# Patient Record
Sex: Female | Born: 1935 | Race: White | Hispanic: No | Smoking: Never smoker
Health system: Southern US, Community
[De-identification: ages and names within clinical notes are randomized; demographics above are authoritative.]

## PROBLEM LIST (undated history)

## (undated) DIAGNOSIS — S0993XA Unspecified injury of face, initial encounter: Secondary | ICD-10-CM

## (undated) DIAGNOSIS — E785 Hyperlipidemia, unspecified: Secondary | ICD-10-CM

## (undated) DIAGNOSIS — G629 Polyneuropathy, unspecified: Secondary | ICD-10-CM

## (undated) DIAGNOSIS — R55 Syncope and collapse: Secondary | ICD-10-CM

## (undated) DIAGNOSIS — I10 Essential (primary) hypertension: Secondary | ICD-10-CM

## (undated) DIAGNOSIS — M542 Cervicalgia: Secondary | ICD-10-CM

## (undated) DIAGNOSIS — M1990 Unspecified osteoarthritis, unspecified site: Secondary | ICD-10-CM

## (undated) DIAGNOSIS — I509 Heart failure, unspecified: Secondary | ICD-10-CM

## (undated) DIAGNOSIS — I4891 Unspecified atrial fibrillation: Secondary | ICD-10-CM

## (undated) DIAGNOSIS — S22080A Wedge compression fracture of T11-T12 vertebra, initial encounter for closed fracture: Secondary | ICD-10-CM

## (undated) DIAGNOSIS — M8580 Other specified disorders of bone density and structure, unspecified site: Secondary | ICD-10-CM

## (undated) DIAGNOSIS — K52832 Lymphocytic colitis: Secondary | ICD-10-CM

## (undated) DIAGNOSIS — I700 Atherosclerosis of aorta: Secondary | ICD-10-CM

## (undated) DIAGNOSIS — R7303 Prediabetes: Secondary | ICD-10-CM

## (undated) DIAGNOSIS — I2510 Atherosclerotic heart disease of native coronary artery without angina pectoris: Secondary | ICD-10-CM

## (undated) DIAGNOSIS — M109 Gout, unspecified: Secondary | ICD-10-CM

## (undated) DIAGNOSIS — F418 Other specified anxiety disorders: Secondary | ICD-10-CM

## (undated) DIAGNOSIS — J849 Interstitial pulmonary disease, unspecified: Secondary | ICD-10-CM

## (undated) DIAGNOSIS — M545 Low back pain: Secondary | ICD-10-CM

## (undated) HISTORY — DX: Unspecified osteoarthritis, unspecified site: M19.90

## (undated) HISTORY — DX: Lymphocytic colitis: K52.832

## (undated) HISTORY — PX: CATARACT EXTRACTION: SUR2

## (undated) HISTORY — DX: Heart failure, unspecified: I50.9

## (undated) HISTORY — DX: Interstitial pulmonary disease, unspecified: J84.9

## (undated) HISTORY — DX: Atherosclerotic heart disease of native coronary artery without angina pectoris: I25.10

## (undated) HISTORY — PX: CARDIAC CATHETERIZATION: SHX172

## (undated) HISTORY — DX: Cervicalgia: M54.2

## (undated) HISTORY — DX: Wedge compression fracture of t11-T12 vertebra, initial encounter for closed fracture: S22.080A

## (undated) HISTORY — DX: Gout, unspecified: M10.9

## (undated) HISTORY — DX: Atherosclerosis of aorta: I70.0

## (undated) HISTORY — PX: PERCUTANEOUS CORONARY STENT INTERVENTION (PCI-S): SHX6016

## (undated) HISTORY — DX: Other specified anxiety disorders: F41.8

## (undated) HISTORY — DX: Unspecified injury of face, initial encounter: S09.93XA

## (undated) HISTORY — PX: TUBAL LIGATION: SHX77

## (undated) HISTORY — DX: Other specified disorders of bone density and structure, unspecified site: M85.80

## (undated) HISTORY — DX: Syncope and collapse: R55

## (undated) HISTORY — PX: TONSILLECTOMY: SUR1361

## (undated) HISTORY — DX: Unspecified atrial fibrillation: I48.91

## (undated) HISTORY — DX: Hyperlipidemia, unspecified: E78.5

## (undated) HISTORY — DX: Essential (primary) hypertension: I10

## (undated) HISTORY — DX: Prediabetes: R73.03

## (undated) HISTORY — DX: Low back pain: M54.5

## (undated) HISTORY — DX: Polyneuropathy, unspecified: G62.9

---

## 1999-04-04 DIAGNOSIS — R7303 Prediabetes: Secondary | ICD-10-CM

## 1999-04-04 HISTORY — DX: Prediabetes: R73.03

## 2003-04-04 DIAGNOSIS — M545 Low back pain, unspecified: Secondary | ICD-10-CM

## 2003-04-04 HISTORY — DX: Low back pain, unspecified: M54.50

## 2003-04-04 HISTORY — PX: LUMBAR LAMINECTOMY: SHX95

## 2004-01-08 ENCOUNTER — Emergency Department (HOSPITAL_COMMUNITY): Admission: EM | Admit: 2004-01-08 | Discharge: 2004-01-09 | Payer: Self-pay | Admitting: Emergency Medicine

## 2004-01-18 ENCOUNTER — Inpatient Hospital Stay (HOSPITAL_COMMUNITY): Admission: RE | Admit: 2004-01-18 | Discharge: 2004-01-20 | Payer: Self-pay | Admitting: Neurosurgery

## 2004-02-03 ENCOUNTER — Ambulatory Visit (HOSPITAL_COMMUNITY): Admission: RE | Admit: 2004-02-03 | Discharge: 2004-02-03 | Payer: Self-pay | Admitting: Neurosurgery

## 2004-02-24 ENCOUNTER — Encounter: Admission: RE | Admit: 2004-02-24 | Discharge: 2004-02-24 | Payer: Self-pay | Admitting: Neurosurgery

## 2004-05-15 ENCOUNTER — Emergency Department: Payer: Self-pay | Admitting: Emergency Medicine

## 2004-05-15 ENCOUNTER — Other Ambulatory Visit: Payer: Self-pay

## 2004-05-20 ENCOUNTER — Ambulatory Visit: Payer: Self-pay | Admitting: Internal Medicine

## 2004-10-11 ENCOUNTER — Ambulatory Visit: Payer: Self-pay | Admitting: Internal Medicine

## 2005-04-21 ENCOUNTER — Encounter: Admission: RE | Admit: 2005-04-21 | Discharge: 2005-04-21 | Payer: Self-pay | Admitting: Anesthesiology

## 2005-10-26 ENCOUNTER — Ambulatory Visit: Payer: Self-pay | Admitting: Internal Medicine

## 2006-09-07 ENCOUNTER — Inpatient Hospital Stay: Payer: Self-pay | Admitting: Internal Medicine

## 2006-09-07 ENCOUNTER — Other Ambulatory Visit: Payer: Self-pay

## 2006-09-17 ENCOUNTER — Ambulatory Visit: Payer: Self-pay | Admitting: Cardiology

## 2006-12-19 ENCOUNTER — Ambulatory Visit: Payer: Self-pay | Admitting: Internal Medicine

## 2006-12-28 ENCOUNTER — Ambulatory Visit: Payer: Self-pay | Admitting: Cardiology

## 2007-02-25 ENCOUNTER — Ambulatory Visit: Payer: Self-pay | Admitting: Cardiology

## 2007-03-22 ENCOUNTER — Encounter: Payer: Self-pay | Admitting: Internal Medicine

## 2007-03-22 ENCOUNTER — Ambulatory Visit: Payer: Self-pay | Admitting: Cardiology

## 2007-03-22 LAB — CONVERTED CEMR LAB
BUN: 22 mg/dL (ref 6–23)
CO2: 27 meq/L (ref 19–32)
Calcium: 10 mg/dL (ref 8.4–10.5)
Chloride: 97 meq/L (ref 96–112)
Creatinine, Ser: 1.03 mg/dL (ref 0.40–1.20)
Glucose, Bld: 101 mg/dL — ABNORMAL HIGH (ref 70–99)
Potassium: 3.8 meq/L (ref 3.5–5.3)
Sodium: 143 meq/L (ref 135–145)

## 2007-04-11 ENCOUNTER — Ambulatory Visit: Payer: Self-pay | Admitting: Cardiology

## 2007-06-18 ENCOUNTER — Ambulatory Visit (HOSPITAL_COMMUNITY): Admission: RE | Admit: 2007-06-18 | Discharge: 2007-06-18 | Payer: Self-pay | Admitting: Anesthesiology

## 2007-07-03 ENCOUNTER — Ambulatory Visit: Payer: Self-pay | Admitting: Internal Medicine

## 2007-07-03 LAB — CONVERTED CEMR LAB: Pro B Natriuretic peptide (BNP): 149 pg/mL — ABNORMAL HIGH (ref 0.0–100.0)

## 2007-08-12 ENCOUNTER — Ambulatory Visit: Payer: Self-pay | Admitting: Cardiology

## 2007-10-02 ENCOUNTER — Ambulatory Visit (HOSPITAL_COMMUNITY): Admission: RE | Admit: 2007-10-02 | Discharge: 2007-10-02 | Payer: Self-pay | Admitting: Anesthesiology

## 2007-12-20 ENCOUNTER — Ambulatory Visit: Payer: Self-pay | Admitting: Internal Medicine

## 2008-01-09 ENCOUNTER — Ambulatory Visit: Payer: Self-pay | Admitting: Cardiology

## 2008-07-02 ENCOUNTER — Encounter: Payer: Self-pay | Admitting: Cardiology

## 2008-07-02 ENCOUNTER — Ambulatory Visit: Payer: Self-pay | Admitting: Cardiology

## 2008-07-02 DIAGNOSIS — I2510 Atherosclerotic heart disease of native coronary artery without angina pectoris: Secondary | ICD-10-CM

## 2008-07-02 DIAGNOSIS — E785 Hyperlipidemia, unspecified: Secondary | ICD-10-CM

## 2008-07-02 DIAGNOSIS — I10 Essential (primary) hypertension: Secondary | ICD-10-CM | POA: Insufficient documentation

## 2008-07-02 DIAGNOSIS — R21 Rash and other nonspecific skin eruption: Secondary | ICD-10-CM | POA: Insufficient documentation

## 2008-08-25 ENCOUNTER — Encounter: Admission: RE | Admit: 2008-08-25 | Discharge: 2008-08-25 | Payer: Self-pay | Admitting: Anesthesiology

## 2008-11-25 ENCOUNTER — Ambulatory Visit: Payer: Self-pay | Admitting: Cardiology

## 2008-12-21 ENCOUNTER — Ambulatory Visit: Payer: Self-pay | Admitting: Internal Medicine

## 2009-01-05 ENCOUNTER — Telehealth: Payer: Self-pay | Admitting: Cardiology

## 2009-01-18 ENCOUNTER — Telehealth: Payer: Self-pay | Admitting: Cardiology

## 2009-01-19 ENCOUNTER — Ambulatory Visit: Payer: Self-pay | Admitting: Cardiology

## 2009-01-19 DIAGNOSIS — R609 Edema, unspecified: Secondary | ICD-10-CM

## 2009-03-02 ENCOUNTER — Ambulatory Visit: Payer: Self-pay | Admitting: Cardiology

## 2009-03-09 ENCOUNTER — Telehealth (INDEPENDENT_AMBULATORY_CARE_PROVIDER_SITE_OTHER): Payer: Self-pay | Admitting: *Deleted

## 2009-03-10 ENCOUNTER — Ambulatory Visit: Payer: Self-pay

## 2009-03-10 ENCOUNTER — Encounter (HOSPITAL_COMMUNITY): Admission: RE | Admit: 2009-03-10 | Discharge: 2009-04-01 | Payer: Self-pay | Admitting: Cardiology

## 2009-03-10 ENCOUNTER — Ambulatory Visit: Payer: Self-pay | Admitting: Cardiology

## 2009-03-22 ENCOUNTER — Ambulatory Visit: Payer: Self-pay | Admitting: Cardiology

## 2009-07-05 ENCOUNTER — Encounter: Payer: Self-pay | Admitting: Cardiology

## 2009-08-05 ENCOUNTER — Encounter: Payer: Self-pay | Admitting: Cardiology

## 2009-08-09 ENCOUNTER — Ambulatory Visit: Payer: Self-pay | Admitting: Cardiology

## 2009-12-23 ENCOUNTER — Telehealth: Payer: Self-pay | Admitting: Cardiology

## 2009-12-23 ENCOUNTER — Ambulatory Visit: Payer: Self-pay | Admitting: Internal Medicine

## 2010-01-10 ENCOUNTER — Ambulatory Visit: Payer: Self-pay | Admitting: Internal Medicine

## 2010-01-11 ENCOUNTER — Ambulatory Visit: Payer: Self-pay

## 2010-01-12 ENCOUNTER — Ambulatory Visit: Payer: Self-pay | Admitting: Cardiology

## 2010-01-14 ENCOUNTER — Ambulatory Visit: Payer: Self-pay | Admitting: Internal Medicine

## 2010-02-21 ENCOUNTER — Telehealth: Payer: Self-pay | Admitting: Cardiology

## 2010-04-23 ENCOUNTER — Encounter: Payer: Self-pay | Admitting: Neurosurgery

## 2010-05-03 NOTE — Miscellaneous (Signed)
Summary: [facility]  Clinical Lists Changes  Observations: Added new observation of NUCLEAR NOS: Exercise Capacity: Poor exercise capacity. BP Response: Normal blood pressure response. Clinical Symptoms: There is dyspnea. ECG Impression: No significant ST segment change suggestive of ischemia. Overall Impression: There is mild apical thinning but  no sign of scar or ischemia.  (03/10/2009 10:54)      Nuclear Study  Procedure date:  03/10/2009  Findings:      Exercise Capacity: Poor exercise capacity. BP Response: Normal blood pressure response. Clinical Symptoms: There is dyspnea. ECG Impression: No significant ST segment change suggestive of ischemia. Overall Impression: There is mild apical thinning but  no sign of scar or ischemia.

## 2010-05-03 NOTE — Progress Notes (Signed)
Summary: rx refill  Medications Added * MVI one by mouth daily MULTIVITAMINS   TABS (MULTIPLE VITAMIN) 1 tab once daily TORSEMIDE 20 MG TABS (TORSEMIDE) one by mouth daily FUROSEMIDE 20 MG TABS (FUROSEMIDE) as needed ALPRAZOLAM 0.25 MG TABS (ALPRAZOLAM) one by mouth three times a day as needed AMLODIPINE BESYLATE 5 MG TABS (AMLODIPINE BESYLATE) 1 qam 1/2 qpm AMLODIPINE BESYLATE 5 MG TABS (AMLODIPINE BESYLATE) 1 qam 1/2 qpm NASONEX 50 MCG/ACT SUSP (MOMETASONE FUROATE) daily NASONEX 50 MCG/ACT SUSP (MOMETASONE FUROATE) daily KLOR-CON M20 20 MEQ CR-TABS (POTASSIUM CHLORIDE CRYS CR) one by mouth daily KLOR-CON M20 20 MEQ CR-TABS (POTASSIUM CHLORIDE CRYS CR) 3 times a week * HCTZ 25MG  one by mouth daily HYDROCHLOROTHIAZIDE 25 MG TABS (HYDROCHLOROTHIAZIDE) 1 tab once daily METOPROLOL TARTRATE 25 MG TABS (METOPROLOL TARTRATE) one by mouth two times a day NITROGLYCERIN 0.4 MG SUBL (NITROGLYCERIN) as needed * ASPRIN 81MG  one by mouth daily ASPIRIN 81 MG TBEC (ASPIRIN) Take one tablet by mouth daily MOBIC 15 MG TABS (MELOXICAM) 1 tab daily MOBIC 15 MG TABS (MELOXICAM) 1 tab daily GABAPENTIN 100 MG CAPS (GABAPENTIN) 1 to 2 caps at bedtime GABAPENTIN 100 MG CAPS (GABAPENTIN) 1 to 2 caps at bedtime METANX 3-35-2 MG TABS (L-METHYLFOLATE-B6-B12) 1 tab two times a day METANX 3-35-2 MG TABS (L-METHYLFOLATE-B6-B12) 1 tab two times a day AMLODIPINE BESYLATE 5 MG TABS (AMLODIPINE BESYLATE) one by mouth two times a day AMLODIPINE BESYLATE 5 MG TABS (AMLODIPINE BESYLATE) one by mouth two times a day SIMVASTATIN 10 MG TABS (SIMVASTATIN) 1tab at bedtime SIMVASTATIN 40 MG TABS (SIMVASTATIN) 1 po daily ALENDRONATE SODIUM 70 MG TABS (ALENDRONATE SODIUM) 1 tab weekly ALLOPURINOL 100 MG TABS (ALLOPURINOL) 1 by mouth at bedtime ZITHROMAX Z-PAK 250 MG TABS (AZITHROMYCIN) 1 by mouth daily ZITHROMAX Z-PAK 250 MG TABS (AZITHROMYCIN) 1 by mouth daily       Phone Note Refill Request Message from:  Pharmacy on  February 21, 2010 11:35 AM  Refills Requested: Medication #1:  KLOR-CON M20 20 MEQ CR-TABS one by mouth daily [phone]   Method Requested: Telephone to Pharmacy Initial call taken by: Roe Coombs,  February 21, 2010 11:35 AM  Follow-up for Phone Call        RX sent into pharmacy. LMOM. Marrion Coy, CNA  February 21, 2010 2:12 PM  Follow-up by: Marrion Coy, CNA,  February 21, 2010 2:12 PM    Prescriptions: KLOR-CON M20 20 MEQ CR-TABS (POTASSIUM CHLORIDE CRYS CR) one by mouth daily  #30 x 11   Entered by:   Marrion Coy, CNA   Authorized by:   Rollene Rotunda, MD, [facility]   Signed by:   Marrion Coy, CNA on 02/21/2010   Method used:   [facility]        [facility] [employer] [id number][facility] (retail)       [address] [place], [place]  [address]       Ph: [phone] or [phone]       Fax: [phone]   RxID:   [id number]

## 2010-05-03 NOTE — Assessment & Plan Note (Signed)
Summary: 414.01  4 months  Medications Added SIMVASTATIN 40 MG TABS (SIMVASTATIN) 1 po daily ALLOPURINOL 100 MG TABS (ALLOPURINOL) 1 by mouth at bedtime ZITHROMAX Z-PAK 250 MG TABS (AZITHROMYCIN) 1 by mouth daily      Allergies Added:   Visit Type:  Follow-up Primary Provider:  Dr. Randa Lynn  CC:  CAD.  History of Present Illness: The patient presents for followup. Since I last saw her she was bothered by gout. She's also had a sinus infection. She's had no cardiac complaints and denies any chest pressure, neck or arm discomfort. She denied having palpitations, presyncope or syncope. She denies any shortness of breath. She walks her dogs 2 miles daily.  Current Medications (verified): 1)  Multivitamins   Tabs (Multiple Vitamin) .Marland Kitchen.. 1 Tab Once Daily 2)  Torsemide 20 Mg Tabs (Torsemide) .... One By Mouth Daily 3)  Alprazolam 0.25 Mg Tabs (Alprazolam) .... One By Mouth Three Times A Day As Needed 4)  Klor-Con M20 20 Meq Cr-Tabs (Potassium Chloride Crys Cr) .... One By Mouth Daily 5)  Hydrochlorothiazide 25 Mg Tabs (Hydrochlorothiazide) .Marland Kitchen.. 1 Tab Once Daily 6)  Metoprolol Tartrate 25 Mg Tabs (Metoprolol Tartrate) .... One By Mouth Two Times A Day 7)  Nitroglycerin 0.4 Mg Subl (Nitroglycerin) .... As Needed 8)  Aspirin 81 Mg Tbec (Aspirin) .... Take One Tablet By Mouth Daily 9)  Gabapentin 100 Mg Caps (Gabapentin) .Marland Kitchen.. 1 To 2 Caps At Bedtime 10)  Simvastatin 40 Mg Tabs (Simvastatin) .Marland Kitchen.. 1 Po Daily 11)  Alendronate Sodium 70 Mg Tabs (Alendronate Sodium) .Marland Kitchen.. 1 Tab Weekly 12)  Allopurinol 100 Mg Tabs (Allopurinol) .Marland Kitchen.. 1 By Mouth At Bedtime 13)  Zithromax Z-Pak 250 Mg Tabs (Azithromycin) .Marland Kitchen.. 1 By Mouth Daily  Allergies (verified): 1)  ! * Ivp Dye 2)  ! Codeine  Past History:  Past Medical History: Coronary artery disease (95% to 75% LAD stenosis treated with Cypher stenting x2 in June   2008 well-preserved ejection fraction) Dyslipidemia 3-4 year Depression/anxiety Gout  Past  Surgical History: Reviewed history from 07/01/2008 and no changes required. Lumbar disk surgery Tonsillectomy  Review of Systems       As stated in the HPI and negative for all other systems.   Vital Signs:  Patient profile:   77 year old female Height:      65 inches Weight:      161 pounds BMI:     26.89 Pulse rate:   85 / minute Resp:     16 per minute BP sitting:   120 / 66  (right arm)  Vitals Entered By: Marrion Coy, CNA (Aug 09, 2009 9:33 AM)  Physical Exam  General:  Well developed, well nourished, in no acute distress. Head:  normocephalic and atraumatic Eyes:  PERRLA/EOM intact; conjunctiva and lids normal. Neck:  Neck supple, no JVD. No masses, thyromegaly or abnormal cervical nodes. Chest Wall:  no deformities or breast masses noted Lungs:  Clear bilaterally to auscultation and percussion. Heart:  Non-displaced PMI, chest non-tender; regular rate and rhythm, S1, S2 without murmurs, rubs or gallops. Carotid upstroke normal, no bruit. Normal abdominal aortic size, no bruits. Femorals normal pulses, no bruits. Pedals normal pulses. No edema, no varicosities. Abdomen:  Bowel sounds positive; abdomen soft and non-tender without masses, organomegaly, or hernias noted. No hepatosplenomegaly. Msk:  Back normal, normal gait. Muscle strength and tone normal. Extremities:  No clubbing or cyanosis. Skin:  Intact without lesions or rashes. Psych:  Normal affect.   EKG  Procedure date:  08/09/2009  Findings:      sinus rhythm, rate 85, axis within normal limits, intervals within normal limits, no acute ST-T wave changes, nonspecific T-wave flattening  Impression & Recommendations:  Problem # 1:  CAD (ICD-414.00) She has had no new symptoms. No further cardiovascular testing is suggested. She will continue the meds as listed. Orders: EKG w/ Interpretation (93000)  Problem # 2:  EDEMA (ICD-782.3) She would like to switch to furosemide because of cost. When she runs  out of her current dose of torsemide I will start 40 mg daily furosemide.  Problem # 3:  ESSENTIAL HYPERTENSION, BENIGN (ICD-401.1) Her blood pressure is controlled and she will continue the meds as listed.  Problem # 4:  HYPERLIPIDEMIA (ICD-272.4) She had a recent lipid profile which she forgot to bring with her. She will mail this and I will be happy to review. Her updated medication list for this problem includes:    Simvastatin 40 Mg Tabs (Simvastatin) .Marland Kitchen... 1 po daily  Patient Instructions: 1)  Your physician recommends that you schedule a follow-up appointment in: 6 months with Dr Antoine Poche 2)  Your physician recommends that you continue on your current medications as directed. Please refer to the Current Medication list given to you today.

## 2010-05-03 NOTE — Progress Notes (Signed)
Summary: b/p issues   Phone Note Call from Patient Call back at Home Phone [phone]   Caller: Patient Reason for Call: Talk to Nurse Summary of Call: per pt calling c/o b/p bounce around. b/p today 141/103 pulse 77.  Initial call taken by: Lorne Skeens,  December 23, 2009 3:31 PM  Follow-up for Phone Call        BP has been bouncing around and she would like to be seen,  complaints of head bounding every time her bp is up, having some panic attacks at night and she gets up and does a puzzle or two to calm herself down and go back to bed.  Pt would like to be seen after 01/06/2010 when she has an appointment with her primary care MD.  Appt given for 01/12/2010 at 10:45 sm.  Pt will call back prior to then if she develops more problems.  She will also keep a BP journal to review at the time of the appt. Follow-up by: Charolotte Capuchin, RN,  December 23, 2009 4:20 PM

## 2010-05-03 NOTE — Assessment & Plan Note (Signed)
Summary: per pt request, BP issues      Allergies Added:   Visit Type:  Follow-up Primary Provider:  Dr. Randa Lynn  CC:  CAD.  History of Present Illness: The patient presents for followup of her known coronary disease and chest discomfort. Since I last saw her she continued to be evaluated or treated for right lower extremity swelling and right lung. She's had no new cardiovascular complaints. She's had no chest pressure, neck or arm discomfort. She thinks maybe she's taken one nitroglycerin since I saw her but doesn't recall the details. She's had no new shortness of breath, PND or orthopnea. He's had no palpitations, presyncope or syncope. She says she's had a couple of episodes with blood pressure going up at night. Her heart rate might be increased when she checks it at that time as well. She does describe what she thinks her panic attacks which is why we did a stress test previously. She will occasionally take an aspirin metoprolol and relax and things calm down.  Current Medications (verified): 1)  Multivitamins   Tabs (Multiple Vitamin) .Marland Kitchen.. 1 Tab Once Daily 2)  Torsemide 20 Mg Tabs (Torsemide) .... One By Mouth Daily 3)  Alprazolam 0.25 Mg Tabs (Alprazolam) .... One By Mouth Three Times A Day As Needed 4)  Klor-Con M20 20 Meq Cr-Tabs (Potassium Chloride Crys Cr) .... One By Mouth Daily 5)  Hydrochlorothiazide 25 Mg Tabs (Hydrochlorothiazide) .Marland Kitchen.. 1 Tab Once Daily 6)  Metoprolol Tartrate 25 Mg Tabs (Metoprolol Tartrate) .... One By Mouth Two Times A Day 7)  Nitroglycerin 0.4 Mg Subl (Nitroglycerin) .... As Needed 8)  Aspirin 81 Mg Tbec (Aspirin) .... Take One Tablet By Mouth Daily 9)  Simvastatin 40 Mg Tabs (Simvastatin) .Marland Kitchen.. 1 Po Daily 10)  Alendronate Sodium 70 Mg Tabs (Alendronate Sodium) .Marland Kitchen.. 1 Tab Weekly 11)  Allopurinol 100 Mg Tabs (Allopurinol) .Marland Kitchen.. 1 By Mouth At Bedtime  Allergies (verified): 1)  ! * Ivp Dye 2)  ! Codeine  Past History:  Past Medical History: Reviewed  history from 08/09/2009 and no changes required. Coronary artery disease (95% to 75% LAD stenosis treated with Cypher stenting x2 in June   2008 well-preserved ejection fraction) Dyslipidemia 3-4 year Depression/anxiety Gout  Past Surgical History: Reviewed history from 07/01/2008 and no changes required. Lumbar disk surgery Tonsillectomy  Review of Systems       As stated in the HPI and negative for all other systems.   Vital Signs:  Patient profile:   77 year old female Height:      65 inches Weight:      159 pounds BMI:     26.55 Pulse rate:   60 / minute Resp:     16 per minute BP sitting:   145 / 77  (right arm)  Vitals Entered By: Marrion Coy, CNA (January 12, 2010 10:44 AM)  Physical Exam  General:  Well developed, well nourished, in no acute distress. Head:  normocephalic and atraumatic Eyes:  PERRLA/EOM intact; conjunctiva and lids normal. Mouth:  Teeth, gums and palate normal. Oral mucosa normal. Neck:  Neck supple, no JVD. No masses, thyromegaly or abnormal cervical nodes. Chest Wall:  no deformities Lungs:  Clear bilaterally to auscultation and percussion. Heart:  Non-displaced PMI, chest non-tender; regular rate and rhythm, S1, S2 without murmurs, rubs or gallops. Carotid upstroke normal, no bruit. Normal abdominal aortic size, no bruits.  Abdomen:  Bowel sounds positive; abdomen soft and non-tender without masses, organomegaly, or hernias noted. No hepatosplenomegaly.  Msk:  Back normal, normal gait. Muscle strength and tone normal. Extremities:  Right leg with chronic lower extremity edema and bandaged Neurologic:  Alert and oriented x 3. Cervical Nodes:  no significant adenopathy Psych:  Normal affect.   EKG  Procedure date:  01/12/2010  Findings:      Sinus rhythm, rate 60, axis within normal limits, intervals within normal limits, low voltage in the chest leads.  Impression & Recommendations:  Problem # 1:  CAD (ICD-414.00) She has no new  symptoms. No further cardiovascular testing is suggested. She will continue with risk reduction. Orders: EKG w/ Interpretation (93000)  Problem # 2:  HYPERLIPIDEMIA (ICD-272.4) I reviewed this done by physician recently. Her LDL was 97 with an HDL of 40. These are acceptable goals. She has no contraindication to the simvastatin and will remain on this.  Problem # 3:  ESSENTIAL HYPERTENSION, BENIGN (ICD-401.1) Her blood pressure is slightly elevated but this is unusual. She will keep a watch on this and understands the goal should be less than 140/90 consistently.  Patient Instructions: 1)  Your physician recommends that you schedule a follow-up appointment in: 6 months 2)  Your physician recommends that you continue on your current medications as directed. Please refer to the Current Medication list given to you today.

## 2010-07-15 ENCOUNTER — Encounter: Payer: Self-pay | Admitting: Cardiology

## 2010-07-19 ENCOUNTER — Ambulatory Visit (INDEPENDENT_AMBULATORY_CARE_PROVIDER_SITE_OTHER): Payer: Medicare Other | Admitting: Cardiology

## 2010-07-19 ENCOUNTER — Encounter: Payer: Self-pay | Admitting: Cardiology

## 2010-07-19 VITALS — BP 128/70 | HR 89 | Ht 65.0 in | Wt 153.8 lb

## 2010-07-19 DIAGNOSIS — I10 Essential (primary) hypertension: Secondary | ICD-10-CM

## 2010-07-19 DIAGNOSIS — E785 Hyperlipidemia, unspecified: Secondary | ICD-10-CM

## 2010-07-19 DIAGNOSIS — I2510 Atherosclerotic heart disease of native coronary artery without angina pectoris: Secondary | ICD-10-CM

## 2010-07-19 DIAGNOSIS — R252 Cramp and spasm: Secondary | ICD-10-CM

## 2010-07-19 DIAGNOSIS — R002 Palpitations: Secondary | ICD-10-CM

## 2010-07-19 NOTE — Assessment & Plan Note (Signed)
Back she describes palpitations treated with her beta blocker. At this point if they increase in frequency or intensity I will plan an event monitor. It is interesting that they happen predominantly at night and she has described anxiety in the past. This could be related but she will let me know if her symptoms worsen.

## 2010-07-19 NOTE — Assessment & Plan Note (Signed)
She describes cramping in different areas. We discussed some over-the-counter remedies. Also she is to have blood work next week. I have asked her to please get a potassium checked and I will send this note to her primary physician.

## 2010-07-19 NOTE — Assessment & Plan Note (Signed)
The patient has no new sypmtoms.  No further cardiovascular testing is indicated.  We will continue with aggressive risk reduction and meds as listed.  

## 2010-07-19 NOTE — Assessment & Plan Note (Signed)
She has is followed by Dr. Randa Lynn and I will defer to him. The goal should be an LDL less than 100 and HDL greater than 40.

## 2010-07-19 NOTE — Patient Instructions (Signed)
Follow-up in 6 months.  Continue current medication.

## 2010-07-19 NOTE — Progress Notes (Signed)
HPI The patient presents for 6 month followup. Since I last saw her she has had no new cardiovascular complaints. She has several somatic complaints but two that might be cardiac. She describes palpitations occurring occasionally. They seem to happen more at night. She says they happen at rest her heart rate going into the 120s. She will take her beta blocker and this will slowly come down. She is not describing presyncope or syncope. She is not describing any of the dyspnea that was her previous angina and she has no PND or orthopnea. She does have cramping and she describes her right arm "drawing". She describes cramping in her jaw occasionally. She does related to her torsemide. She says her blood work has been checking her potassium has been normal.  Allergies  Allergen Reactions  . Codeine     Current Outpatient Prescriptions  Medication Sig Dispense Refill  . alendronate (FOSAMAX) 70 MG tablet Take 70 mg by mouth every 7 (seven) days. Take with a full glass of water on an empty stomach.       Marland Kitchen allopurinol (ZYLOPRIM) 100 MG tablet Take 100 mg by mouth 2 (two) times daily.       Marland Kitchen ALPRAZolam (XANAX) 0.25 MG tablet Take 0.25 mg by mouth at bedtime as needed.        Marland Kitchen aspirin 81 MG tablet Take 81 mg by mouth daily.        . hydrochlorothiazide 25 MG tablet Take 25 mg by mouth daily.        . metoprolol tartrate (LOPRESSOR) 25 MG tablet Take 25 mg by mouth 2 (two) times daily.        . Multiple Vitamin (MULTIVITAMIN) tablet Take 1 tablet by mouth daily.        . nitroGLYCERIN (NITROSTAT) 0.4 MG SL tablet Place 0.4 mg under the tongue every 5 (five) minutes as needed.        . potassium chloride (KLOR-CON) 20 MEQ packet Take 20 mEq by mouth daily.        . simvastatin (ZOCOR) 40 MG tablet Take 40 mg by mouth at bedtime.        . torsemide (DEMADEX) 20 MG tablet Take 20 mg by mouth daily.          Past Medical History  Diagnosis Date  . CAD (coronary artery disease)     95% LAD stenosis,  Cypher x 02 September 1996  . Dyslipidemia   . Depression with anxiety   . Gout     Past Surgical History  Procedure Date  . Lumbar disc surgery   . Tonsillectomy     ROS:  As stated in the HPI and negative for all other systems.  PHYSICAL EXAM BP 128/70  Pulse 89  Ht 5\' 5"  (1.651 m)  Wt 153 lb 12.8 oz (69.763 kg)  BMI 25.59 kg/m2 GENERAL:  Well appearing HEENT:  Pupils equal round and reactive, fundi not visualized, oral mucosa unremarkable NECK:  No jugular venous distention, waveform within normal limits, carotid upstroke brisk and symmetric, no bruits, no thyromegaly LYMPHATICS:  No cervical, inguinal adenopathy LUNGS:  Clear to auscultation bilaterally BACK:  No CVA tenderness CHEST:  Unremarkable HEART:  PMI not displaced or sustained,S1 and S2 within normal limits, no S3, no S4, no clicks, no rubs, no murmurs ABD:  Flat, positive bowel sounds normal in frequency in pitch, no bruits, no rebound, no guarding, no midline pulsatile mass, no hepatomegaly, no splenomegaly EXT:  2 plus pulses  throughout, no edema, no cyanosis no clubbing SKIN:  No rashes no nodules NEURO:  Cranial nerves II through XII grossly intact, motor grossly intact throughout PSYCH:  Cognitively intact, oriented to person place and time  EKG:  Sinus rhythm, rate 89, axis within normal limits, premature ventricular contractions, nonspecific T-wave flattening, QT prolonged  ASSESSMENT AND PLAN

## 2010-08-09 ENCOUNTER — Telehealth: Payer: Self-pay | Admitting: Cardiology

## 2010-08-09 NOTE — Telephone Encounter (Signed)
Walk In Pt Form " Pt Dropped Off labs from Dr.Lamb's Office" sent to [facility]  08/09/10

## 2010-08-16 NOTE — Assessment & Plan Note (Signed)
[facility]                                 ON-CALL NOTE   YVANNA, VIDAS                   MRN:          [id number]  DATE:03/29/2007                            DOB:          11/12/1932    PRIMARY CARDIOLOGIST:  Dr. Antoine Poche.   I received a page to the answering service and called Ms. Plaugher back at  [phone].  She states she needs medication refills.  The patient states  she saw Dr. Antoine Poche recently and had some swelling, and was given 5  days of Lasix and 5 days of potassium to see if this helped with her  lower extremity edema.  The patient states this seemed to resolve the  problem, and she wanted to know if she could get some more pills until  she followed up with Dr. Antoine Poche.  I then spoke with Dr. Antoine Poche to  review this information.  He stated that it would be okay to go ahead  and give her another week or so of the Lasix and have her follow up with  him in the office.  [facility] then [facility]  Sharpless for Lasix 20 mg 1 p.o. daily dispense 14 tablets with no refills  and KCl 20 mEq 1 p.o. daily dispense 14 pills without refills.      Dorian Pod, ACNP  Electronically Signed      Rollene Rotunda, MD, [facility]  Electronically Signed   MB/MedQ  DD: 03/29/2007  DT: 03/29/2007  Job #: [id number]

## 2010-08-16 NOTE — Assessment & Plan Note (Signed)
[facility] OFFICE NOTE   [name], [name]                   MRN:          [id number]  DATE:12/28/2006                            DOB:          03-01-1933    PRIMARY CARE PHYSICIAN:  Dr. Alonna Buckler.   REASON FOR PRESENTATION:  Evaluate patient with coronary disease.   HISTORY OF PRESENT ILLNESS:  Patient is a 77 year old white female with  coronary disease as described below.  She had Cypher stenting.  I now  have these reports.  At the last presentation, she had a rash.  I did  not think this was related to the Cypher stent, and treated it  conservatively.  This has cleared up.  She says she has done relatively  well.  She has had a couple of episodes of chest discomfort.  One was  after eating a fish filet at [employer].  The other was with driving back  from the beach.  They happened this week.  She described a burning  discomfort.  It felt like she needed to burp.  It was mild or moderate  in intensity.  It persisted throughout the evening the other night.  She  took a nitroglycerin without improvement.  The other one was also  following a meal at the beach.  She said that she is not as dyspneic as  she was prior to her catheterization and stenting.  She has not been  having reproducible discomfort.  She does some activities, though she is  not vacuuming anymore.  She does some light housekeeping.  She is not  able to bring on these symptoms.  She denies any neck or arm discomfort.  She has had no palpitations, presyncope, or syncope.  She has had no PND  or orthopnea.   PAST MEDICAL HISTORY:  1. Coronary artery disease (95% and 75% LAD lesion treated with Cypher      stenting x2 in June 2008), well preserved ejection fraction.  2. Hyperlipidemia x3 or 4 years.  3. Depression/anxiety.  4. Lumbar disk surgery.  5. Tonsillectomy.   ALLERGIES:  1. SHELLFISH.  2. IV CONTRAST DYE.   MEDICATIONS:  1. Crestor 5 mg 3 times a week.  2. Plavix 75 mg daily.  3. Alprazolam.  4. Metoprolol 50 mg daily.  5. Hydrochlorothiazide 25 mg daily.  6. Amlodipine 5 mg daily.  7. Cymbalta.   REVIEW OF SYSTEMS:  As stated in the HPI, and otherwise negative for  other systems.   PHYSICAL EXAMINATION:  The patient is in no distress.  Blood pressure 146/72.  Heart rate 60 and regular.  Weight 140 pounds.  HEENT:  Eyes unremarkable.  Pupils equal, round, and reactive to light.  Fundi not visualized.  Oral mucosa unremarkable.  NECK:  No jugular venous distention at 45 degrees.  Carotid upstroke  brisk and symmetric.  No bruits.  No thyromegaly.  LYMPHATICS:  No cervical, axillary, or inguinal adenopathy.  LUNGS:  Clear to auscultation bilaterally.  BACK:  No costovertebral angle tenderness.  CHEST:  Unremarkable.  HEART:  PMI not displaced or sustained.  S1 and S2 within normal limits.  No S3.  No S4.  No clicks.  No rubs.  No murmurs.  ABDOMEN:  Flat.  Positive bowel sounds.  Normal in frequency and pitch.  No bruits.  No rebound.  No guarding.  No midline pulsatile mass.  No  hepatomegaly.  No splenomegaly.  SKIN:  No rashes.  No nodules.  EXTREMITIES:  Two plus pulses throughout.  No edema.  No cyanosis.  No  clubbing.  NEUROLOGIC:  Oriented to person, place, and time.  Cranial nerves 2  through 12 grossly intact.  Motor grossly intact.  EKG:  Sinus rhythm, rate 61, axis within normal limits, intervals within  normal limits, no acute ST-T wave changes.   ASSESSMENT AND PLAN:  1. Chest discomfort.  The patient's chest discomfort is atypical.  At      this point, I am going to take the liberty of giving her Prilosec      20 mg daily to see if this improves her symptoms.  Her symptoms      have occurred now twice associated with meals.  Should she have any      change in this, or worsening of her symptoms, she should either let      Dr. Randa Lynn or myself know.  Should she have any severe  symptoms, she      should call 911.  She understands the need to continue the Plavix      uninterrupted for a year, and then we will discuss the duration      after that.  2. Dyslipidemia.  She had her lipids checked recently by Dr. Randa Lynn.  He      had suggested that she needed to go up on her dose.  She is      reluctant to this because of the cost.  I encouraged her to at      least take 1/2 tablet every day rather than 3 times a week.      Hopefully, she will comply with this.  3. Hypertension.  Blood pressure is controlled, though it is very      slightly elevated today, this is unusual.  She will keep a watch on      this, and continue the medications as listed.  4. Followup.  I will see the patient back again in 2 months to      reassess this chest pain, or sooner if she has any acute problems.     Rollene Rotunda, MD, [facility]  Electronically Signed    JH/MedQ  DD: 12/28/2006  DT: 12/28/2006  Job #: [id number]   cc:   Alonna Buckler, M.D.

## 2010-08-16 NOTE — Assessment & Plan Note (Signed)
[facility]                            CARDIOLOGY OFFICE NOTE   [name], [name]                   MRN:          [id number]  DATE:02/25/2007                            DOB:          08-Sep-1932    PRIMARY CARE PHYSICIAN:  Alonna Buckler, MD   REASON FOR PRESENTATION:  Evaluate patient with coronary disease.   HISTORY OF PRESENT ILLNESS:  The patient is a 77 year old white female  with coronary disease as described below.   At the last visit, she was complaining of some discomfort for which I  gave her Prilosec. She actually thinks this improved that. She is rarely  getting this discomfort now and she will take a Prilosec as needed. She  rarely gets any palpitations. She is still getting a rash. She does have  a raised red spot on her abdomen that she scratches she thinks in the  night. She is not overly bothered by this. She denies any chest  discomfort, neck or arm discomfort. She has had no PND or orthopnea or  new dyspnea. She has had no pre-syncope or syncope.   PAST MEDICAL HISTORY:  1. Coronary artery disease. She is 95% and 75% left anterior      descending artery lesion treated with CYPHER stenting x2 in June      2008 with a well-preserved ejection fraction.  2. Dyslipidemia x3-4 years.  3. Depression/anxiety.  4. Lumbar disc surgery.  5. Tonsillectomy.   ALLERGIES:  SHELLFISH and IV CONTRAST DYE.   MEDICATIONS:  1. Crestor 5 mg three times a week.  2. Plavix 75 mg daily.  3. Metoprolol 50 mg daily.  4. Hydrochlorothiazide 25 mg daily.  5. Amlodipine 5 mg daily.  6. Lexapro 10 mg daily.   REVIEW OF SYSTEMS:  As stated in the HPI and otherwise negative for  other systems.   PHYSICAL EXAMINATION:  The patient is in no distress. Blood pressure  153/90, heart rate 71 and irregular. Weight 147 pounds.  HEENT: Eyelids unremarkable. Pupils equal, round, and reactive to light.  Fundi not visualized.  NECK: No jugular venous  distention at 45 degrees. Carotid upstroke brisk  and symmetrical. No bruits, no thyromegaly.  LYMPHATICS: No adenopathy.  LUNGS: Clear to auscultation bilaterally.  BACK: No costovertebral angle tenderness.  CHEST: Unremarkable.  HEART: PMI not displaced or sustained. S1, S2 within normal limits. No  S3. No S4. No clicks, rub or murmurs.  ABDOMEN: Flat, positive bowel sounds, normal in frequency and pitch. No  bruits. No rebounds. No guarding. No midline pulsatile mass. No  organomegaly.  SKIN: Raised red slight patch of rash on her abdomen.  NEURO: Oriented to person, place and time. Cranial nerves II-XII grossly  intact. Motor grossly intact.   ASSESSMENT/PLAN:  1. Coronary disease. The patient is having no symptoms consistent with      angina. She will continue with secondary risk reduction.  2. Dyslipidemia. She will follow back with Dr. Randa Lynn. She has been      reluctant to take the statin, but I do believe she has been      compliant  with current regimen. I will defer management to Dr. Randa Lynn      with goal LDL of less than 100 and HDL greater than 50.  3. Hypertension. Her blood pressure continues to be slightly elevated.      I am going to take the liberty of increasing amlodipine to 7.5 mg      daily.  4. Rash. This may well be the Plavix. It is not overly bothersome to      the patient. I would very much like to continue the Plavix until      June of next year. I am going to ask her to try to take Benadryl at      night to see if this might help as that seems to be when most of      her itching is.  5. Followup. Will see her back in about four months or sooner if      needed.     Rollene Rotunda, MD, [facility]  Electronically Signed    JH/MedQ  DD: 02/25/2007  DT: 02/25/2007  Job #: [id number]   cc:   Alonna Buckler, MD

## 2010-08-16 NOTE — Assessment & Plan Note (Signed)
[facility]                         ELECTROPHYSIOLOGY OFFICE NOTE   [name], [name]                   MRN:          [id number]  DATE:07/03/2007                            DOB:          11-04-1932    [name] was seen today at the request of the [facility] office  because she presented with a rash on her arms and her legs which has  been attributed to Plavix in the past.  Upon arrival in this office  here, it was clear that her symptoms were much more extensive and that  her biggest concern was the fact that she had been quite dyspneic last  night and was having ongoing problems with dyspnea on exertion to date.  She has had problems with progressive peripheral edema also over the  last couple of weeks.   This has been a recurring theme over the last six or eight months.  I  saw notes from the fall where she presents with similar symptoms.  She  would be treated with a diuretic, and the symptoms would abate.   She also complains of ongoing leg pain.  There have been attempts to  down titrate her Crestor and initiate CoQ 10 without resolution.   As noted, she is on Plavix.  This has been ongoing since June 2008 when  she underwent an angioplasty that for her was quite discombobulated  given the number of people that were involved at [facility].  I do not have  the specifics.   Her medications currently include:  1. Hydrochlorothiazide 25.  2. Metoprolol 25 b.i.d.  3. Alprazolam.  4. Crestor 5 mg 3 times a week.  5. Amlodipine 7.5.  6. Furosemide 20.  7. Plavix 75.   On examination, she is an elderly, Caucasian female appearing her stated  age.  She was no acute distress.  I should note that her age is 29.  Her lungs were clear.  Her heart sounds were regular.  The abdomen was soft.  EXTREMITIES:  Without edema.  The skin had a rash that was rather ichthyotic, red, and scratched.  It  was clearly pruritic.   Electrocardiogram  dated today demonstrated sinus rhythm at 61 with  intervals of 0.14/0.06 /0.40.   IMPRESSION:  1. Shortness of breath with peripheral edema, likely diastolic heart      failure.  2. Known ischemic heart disease.      a.     Prior left anterior descending artery percutaneous coronary       intervention in June of 2008.      b.     Ejection fraction normal at that time.  3. Intolerance of Crestor.  4. Rash on Plavix, question relationship.   The issue about the rash has been longstanding.  I will defer that to  Dr. Antoine Poche.  As relates to her edema, we will plan to increase her  furosemide and put her on 40 mg for 5 days as well as putting her on 20  mEq of potassium for those 5 days.  I have asked her to stop her  amlodipine which may be contributing  to the edema.   She is also to stop her Crestor, and her lipid measurement will be  further deferred to Dr. Randa Lynn.   We will plan to check a BMP today to see if it confirms the diagnosis of  congestive heart failure.   She will see Dr. Berna Bue in the next four weeks or so.     Duke Salvia, MD, [facility]  Electronically Signed    SCK/MedQ  DD: 07/03/2007  DT: 07/04/2007  Job #: [id number]

## 2010-08-16 NOTE — Assessment & Plan Note (Signed)
[facility] OFFICE NOTE   [name], [name]                   MRN:          [id number]  DATE:08/12/2007                            DOB:          [birth year]    PRIMARY:  Dr. Alonna Buckler.   REASON FOR PRESENTATION:  Evaluate patient with coronary disease.   HISTORY OF PRESENT ILLNESS:  The patient is 77 years old.  She presents  for follow-up of the above.  She actually was added onto Dr. Odessa Fleming  schedule in early April because of a rash.  However, he thought the  predominant complaint was dyspnea.  She had been having episodes of  dyspnea.  These occur sporadically.  She says she has had 3-4 episodes.  She says she gets short of breath at night.  She has to go outside and  sit on her porch where it is cool.  She feels like she cannot get a  breath.  It will go away slowly over time.  She is not having any chest  discomfort at this time.  She is not having any palpitations, presyncope  or syncope.  She has not related it to any food that she has eaten.  She  takes her medicines routinely.  She does not notice any abdominal  distention or swelling.  In fact, the swelling that she did have in her  lower extremities has been much less.  She was taken off her amlodipine  for a while, but only stopped this for about 5 days.  She is back on  this dose, and again does not have any lower extremity swelling.  Dr.  Graciela Husbands did increase her Lasix for 5 days.  She is now only taking this as  needed.  She again has had no chest discomfort, neck or arm discomfort.  She is not noticing any palpitations, presyncope or syncope.  When she  is not having these spells, she is otherwise feeling fine and not  complaining of any dyspnea or decreased exercise tolerance.   The patient recently saw a dermatologist about the rash that she has  had.  She was told she had dry skin.  There was no mention that this  might be related to  the Plavix.   PAST MEDICAL HISTORY:  Coronary artery disease (95% and 75% LAD lesions,  treated was Cypher stenting x2 in June 2008, well-preserved ejection  fraction), dyslipidemia 3-4 years, depression/anxiety, lumbar disk  surgery, tonsillectomy.   ALLERGIES:  SHELLFISH AND IV CONTRAST DYE.   MEDICATIONS:  1. Crestor 5 mg three times a week.  2. Plavix 75 mg daily.  3. Metoprolol 50 mg daily.  4. Hydrochlorothiazide 25 mg daily.  5. Cymbalta.  6. Amlodipine 5 mg q.a.m. and 2.5 mg q.p.m.  7. Lasix 20 mg daily (she is not taking this routinely).  8. Potassium 20 mEq daily.   REVIEW OF SYSTEMS:  As stated in HPI, and otherwise negative for other  systems.   PHYSICAL EXAMINATION:  GENERAL:  The patient is in no distress.  VITAL SIGNS:  Blood pressure 136/70, heart rate 72  and regular, weight  157 pounds.  HEENT:  Eyelids unremarkable, pupils equal, round and reactive to light.  Fundi not visualized.  Oral mucosa normal.  NECK:  No jugulovenous distention at 45 degrees.  Carotid upstroke brisk  and symmetrical.  No bruits.  No thyromegaly.  LYMPHATICS:  No cervical, axillary or inguinal adenopathy.  LUNGS:  Clear to auscultation bilaterally.  BACK:  No costovertebral angle tenderness.  CHEST:  Unremarkable.  HEART:  PMI not displaced or sustained, S1-S2 within normal limits.  No  S3, no S4.  No clicks, rubs or murmurs.  ABDOMEN:  Flat, positive bowel sounds, normal in frequency and pitch.  No bruits, no rebound, no guarding, no midline pulsatile mass.  No  hepatomegaly or splenomegaly.  SKIN:  No rashes, no nodules.  EXTREMITIES:  2+ pulses throughout.  No edema, cyanosis or clubbing.  NEURO:  Oriented to person, place and time.  Cranial nerves II-XII  grossly intact.  Motor grossly intact.   ASSESSMENT/PLAN:  1. Dyspnea.  The patient is having some episodic dyspnea.  She had a      slightly elevated brain natriuretic peptide level.  However, in      between these episodes  she is off diuretics and doing well.  I am      not sure if these are episodes of pulmonary edema with diastolic      dysfunction.  I do not suspect ischemia.  She thinks that she could      be getting panicked, which is a possibility.  However, I have asked      her to keep a diary of the food that she has eaten so that we can      understand whether this could be related with salt load.  She      certainly should take Lasix at least 20 mg when this happens.  I      have suggested that she call 911 so they can come evaluate her.      They are happening infrequently.  Otherwise, she will remain on the      medicines as listed.  2. Coronary disease.  No further cardiovascular testing is suggested.      She will continue with risk reduction.  3. Hypertension.  Blood pressure is controlled on the medications as      listed.  She is tolerating the amlodipine, which Dr. Graciela Husbands actually      meant to stop, though she did not understand this instruction.  I      think she will need this medicine.  She is not having any swelling,      and so she should continue on it.  4. Dyslipidemia.  I would love for her to be taking a higher dose of      statin, but she really wants to minimize her medications.  I will      defer lipid management to Dr. Randa Lynn with a goal LDL of less than 100      and HDL greater than 50.  5. Medications.  The patient can come off the Plavix in 1 year.  I      have suggested she continue it, but she would like to stop it and      this will be stopped in June.  It does not seem is related to her      skin rash, though this is not entirely clear, which does give [place]  some indication to try      coming off of it.  She will continue with her aspirin.  6. Follow up.  I will see the patient back in 6 months or sooner if      needed.     Rollene Rotunda, MD, [facility]  Electronically Signed    JH/MedQ  DD: 08/12/2007  DT: 08/12/2007  Job #: [id number]   cc:   Alonna Buckler, MD

## 2010-08-16 NOTE — Assessment & Plan Note (Signed)
[facility] OFFICE NOTE   [name], [name]                   MRN:          [id number]  DATE:09/17/2006                            DOB:          November 27, 1932    REFERRING PHYSICIAN:  Loraine Leriche L. Vear Clock, M.D.   PRIMARY CARE PHYSICIAN:  Dr. Alonna Buckler.   REASON FOR REFERRAL:  Evaluate patient with coronary disease status post  recent stenting.   HISTORY OF PRESENT ILLNESS:  The patient is a lovely, 77 year old white  female. She recently had some chest discomfort and apparently an  abnormal stress perfusion study. I do not have any of the records from  this recent evaluation. There is mention in an office note of stenosis  of her LAD and stenting. She said this was done about a week ago. She  said she was hospitalized afterwards for a couple of days because of  significant nausea. She was sent home on Plavix. She is not sure what  kind of stent she had and does not have a card. She was not sure how  long she was to take the Plavix. She has developed a rash that was in  her pelvic area and up on her abdomen but nowhere else. She has taken  Benadryl for this. She has not changed any of her soaps of detergents.  She says the rash is slowly getting better.   Prior to the procedure, she was having some sporadic chest discomfort on  and off for about a year. Since discharge, she has continued to have  some sporadic shooting discomfort. This was not reproducible. She has  been out walking and has not been able to bring this on. She describes  it as fleeting. There is no associated, nausea, vomiting or diaphoresis.  She had one episode lying in bed Saturday morning of a different type of  chest discomfort. She could not quantify or qualify this further. It  want away spontaneously. This seemed to be at most moderate in intensity  and not particularly distressing to her. She is not having any shortness  of breath.  She denies any PND or orthopnea. She had no palpitations.   PAST MEDICAL HISTORY:  Recently diagnosed coronary disease as described,  hypertension x years, hyperlipidemia x3-4 years, depression/anxiety.   PAST SURGICAL HISTORY:  Lumbar disk surgery, tonsillectomy.   ALLERGIES:  SHELLFISH, IV CONTRAST.   MEDICATIONS:  1. Lexapro 10 mg daily.  2. Crestor 5 mg 3 times per week.  3. Plavix 75 mg daily.  4. Alprazolam.  5. Metoprolol 50 mg daily.  6. Hydrochlorothiazide 25 mg daily.  7. Amlodipine 5 mg daily.   SOCIAL HISTORY:  The patient is a widow. Her husband used to be my  patient. She has one son. She has never smoked cigarettes. She  occasionally drinks beer.   FAMILY HISTORY:  Contributory for first degree relatives with heart  disease but later onset. She does have a sister apparently with a  defibrillator and another brother with heart disease of unclear type.   REVIEW OF SYSTEMS:  As stated in the  HPI and negative for other systems.   PHYSICAL EXAMINATION:  GENERAL:  The patient is happy and in no  distress.  VITAL SIGNS:  Blood pressure 110/66, heart rate 58 and regular, weight  148 pounds.  HEENT:  Eyelids unremarkable, pupils equal, round and reactive to light.  Fundi not visualized. Oral mucosa unremarkable.  NECK:  No jugular venous distention, wave form within normal limits,  carotid upstroke brisk and symmetric, no bruits, no thyromegaly.  LYMPHATICS:  No cervical, axillary or inguinal adenopathy.  LUNGS:  Clear to auscultation bilaterally.  BACK:  No costovertebral angle tenderness.  CHEST:  Unremarkable.  HEART:  PMI not displaced or sustained, S1 and S2 within normal limits,  no S3, no S4, no clicks, no rubs, no murmurs.  ABDOMEN:  Flat, there is a slight raised red rash without pustules in  her pubic area radiating slightly upward to her umbilicus. Positive  bowel sounds, normal in frequency and pitch, no bruits, no rebound, no  guarding, no midline  pulsatile mass, no hepatomegaly, no splenomegaly.  SKIN:  No rashes, no nodules.  EXTREMITIES:  2+ pulses throughout, the right groin is status post  femoral access with slight firm nodule without bruit, pulsatile mass,  exudate or erythema.  NEUROLOGIC:  Oriented to person, place and time. Cranial nerves II-XII  grossly intact. Motor grossly intact.   EKG:  Sinus rhythm, rate 58, axis within normal limits, interval is  within normal limits, no acute ST-T wave changes.   ASSESSMENT/PLAN:  1. Coronary artery disease. The patient has recently had stenting of      her left anterior descending. I need to get the details of this      with her catheterization report. She is now having a slight rash.      However, this does not look like a Plavix rash as I have seen. I      would be very hesitant to discontinue the Plavix this soon from a      stent. I am not sure whether it is a drug coated or not. I have      asked her to continue to use Benadryl and consider some topical      steroids. She says it is actually getting better. She will let me      know in 5-6 days whether it is resolving or getting worse. At that      point, if it is getting worse, might need to discontinue the Plavix      and try ticlopidine. She understands the importance of Plavix and      the risk of stent thrombosis. She will continue her aspirin as      well. She will continue the other medications as listed. I will      call in a prescription for sublingual nitroglycerin though her      current chest pain is very atypical and fleeting. She needs to let      me know if this changes in quality or severity. She understands to      call [phone] with any sustained symptoms.  2. Hypertension. Blood pressure is well controlled on the medications      as listed and she will continue these.  3. Dyslipidemia. The patient has been intolerant of multiple statins     apparently used by Dr. Randa Lynn. I will defer to his management. The       goal will be an LDL of less than 100 and HDL  in the 50s.  4. Followup. I would like to see her back in about 3 months or sooner      if she has any further problems.     Rollene Rotunda, MD, [facility]  Electronically Signed    JH/MedQ  DD: 09/17/2006  DT: 09/17/2006  Job #: [id number]   cc:   Loraine Leriche L. Vear Clock, M.D.  Dr. Randa Lynn

## 2010-08-16 NOTE — Assessment & Plan Note (Signed)
[facility]                            CARDIOLOGY OFFICE NOTE   [name], [name]                   MRN:          [id number]  DATE:08/12/2007                            DOB:          Jan 07, 1933    NO DICTATION     Sherry Rotunda, MD, [facility]     JH/MedQ  DD: 08/12/2007  DT: 08/12/2007  Job #: [id number]

## 2010-08-16 NOTE — Assessment & Plan Note (Signed)
[facility]                            CARDIOLOGY OFFICE NOTE   REAGEN, GOATES                   MRN:          [id number]  DATE:01/09/2008                            DOB:          1933/01/14    PRIMARY CARE PHYSICIAN:  Alonna Buckler, MD   REASON FOR PRESENTATION:  Evaluate the patient with coronary artery  disease.   HISTORY OF PRESENT ILLNESS:  The patient is a pleasant patient who  presents for followup of her known coronary artery disease.  She had  been having episodes of shortness of breath.  We discussed this at the  last appointment.  However, since then she has had none of this.  She  has had no palpitations, presyncope or syncope.  She has had no dyspnea  and denies any chest, neck or arm discomfort.  She said the rash that  she had been battling on her legs is getting better.  She saw Dr. Randa Lynn  recently in [facility].   PAST MEDICAL HISTORY:  Coronary artery disease (95% to 75% LAD stenosis  treated with Cypher stenting x2 in June 2008, well-preserved ejection  fraction), dyslipidemia 3-4 years, depression/anxiety, lumbar disk  surgery, tonsillectomy.   ALLERGIES:  SHELLFISH and IV CONTRAST DYE.   MEDICATIONS:  1. Crestor 5 mg 3 times a week.  2. Plavix 75 mg daily.  3. Metoprolol 50 mg daily.  4. Hydrochlorothiazide 25 mg daily.  5. Amlodipine 5 mg in the morning and 2.5 mg in the afternoon.   REVIEW OF SYSTEMS:  As stated in the HPI and otherwise negative for  other systems.   PHYSICAL EXAMINATION:  GENERAL:  The patient is in no distress.  VITAL SIGNS:  Blood pressure 137/69, heart rate 77 and regular, weight  157 pounds.  HEENT:  Eyelids are unremarkable; pupils are equal, round and reactive  to light; fundi not visualized; oral mucosa unremarkable.  NECK:  No jugular venous distention at 45 degrees; carotid upstroke  brisk and symmetrical.  No bruits, no thyromegaly.  LYMPHATICS:  No cervical, axillary, or  inguinal adenopathy.  LUNGS:  Clear to auscultation bilaterally.  BACK:  No costovertebral angle tenderness.  CHEST:  Unremarkable.  HEART:  PMI not displaced or sustained; S1 and S2 within normal.  No S3,  no S4, no clicks, no rubs, no murmurs.  ABDOMEN:  Flat, positive bowel sounds normal in frequency and pitch; no  bruits, no rebound, no guarding or midline pulsatile mass, no  organomegaly.  SKIN:  No rash, no nodules.  EXTREMITIES:  2+ pulse, no edema.   EKG sinus rhythm, rate 77, axis within normal limits, intervals within  normal limits, nonspecific T-wave flattening.   ASSESSMENT AND PLAN:  1. Coronary artery disease.  The patient has been doing well since her      last visit.  She has had no further chest discomfort or symptoms      suggestive of unstable angina.  At this point, I will continue with      medical management and risk reduction.  2. Hypertension.  Blood pressure is well controlled and she  will      continue with medicines as listed.  3. Dyspnea.  She is not having any problems with this.  I am not clear      what these episodes were, but they seem to have resolved.  4.      Dyslipidemia.  Per Dr. Randa Lynn the goal LDL less 100 and HDL greater      than 40.  4. Followup.  I will see her back in 1 year or sooner if needed.     Rollene Rotunda, MD, [facility]  Electronically Signed    JH/MedQ  DD: 01/09/2008  DT: 01/10/2008  Job #: [id number]   cc:   Alonna Buckler, M.D.

## 2010-08-16 NOTE — Assessment & Plan Note (Signed)
[facility] OFFICE NOTE   DAMON, BAISCH                   MRN:          [id number]  DATE:04/11/2007                            DOB:          03-Mar-1933    PRIMARY:  Dr. Alonna Buckler.   REASON FOR PRESENTATION:  Evaluate patient with coronary disease.   HISTORY OF PRESENT ILLNESS:  Patient presents for followup of the above.  She will be 75 next month.  Since her last visit she has had no new  complaints.  She gets dyspneic if she walks briskly with the dogs.  She  occasionally gets some mild chest pressure but she never feels like she  needs to take a nitroglycerin.  She is continuing to have a little bit  of a rash on her arms.  This she has ascribed to the Plavix.  It is  tolerable and not getting worse.  It is actually probably less than it  was previously.  She will take a little Benadryl if she starts to itch.  She denies any PND or orthopnea.  She has had no palpitations,  presyncope or syncope.   PAST MEDICAL HISTORY:  1. Coronary artery disease (she had a 95% and 75% LAD lesion treated      with CYPHER stenting x2 in June 2008, well-preserved ejection      fraction).  2. Dyslipidemia x 3-4 years.  3. Depression/anxiety.  4. Lumbar disk surgery.  5. Tonsillectomy.   ALLERGIES:  SHELLFISH AND IV CONTRAST DYE.   MEDICATIONS:  1. Crestor 5 mg three times a week.  2. Plavix 75 mg daily.  3. Metoprolol 50 mg daily.  4. Hydrochlorothiazide 25 mg daily.  5. Lexapro 10 mg daily.  6. Amlodipine 5 mg q.a.m. and 2.5 mg q.p.m.  7. Lasix 20 mg p.r.n. swelling.  8. Potassium 20 mEq p.r.n. with the Lasix.   REVIEW OF SYSTEMS:  As stated in the HPI and otherwise negative for  other systems.   PHYSICAL EXAMINATION:  Patient is in no distress.  Blood pressure  110/64, heart rate 63 and regular, weight 146 pounds, body mass index  24.  HEENT:  Eyelids unremarkable, pupils equally round and reactive  to  light, fundi not visualized, oral mucosa unremarkable.  NECK:  No jugular venous distention at 45 degrees, carotid upstroke  brisk and symmetric, no bruits, no thyromegaly.  LYMPHATICS:  No adenopathy.  LUNGS:  Clear to auscultation bilaterally.  BACK:  No costovertebral angle tenderness.  CHEST:  Unremarkable.  HEART:  PMI not displaced or sustained, S1 and S2 within normal limits,  no S3, no S4, no clicks, no rubs, no murmurs.  ABDOMEN:  Flat, positive bowel sounds normal in frequency and pitch, no  bruits, no rebound, no guarding, no midline pulsatile mass, no  organomegaly.  SKIN:  Raised red slight patch of rash on her forearms mostly.  NEURO:  Oriented to person, place, and time; cranial nerves II-XII  grossly intact, motor grossly intact.   EKG sinus rhythm, rate 63, axis within normal limits, intervals within  normal limits, early transition lead  V2, nonspecific T wave flattening.   ASSESSMENT/PLAN:  1. Coronary disease.  Patient is having no symptoms related to this.      She is tolerating this rash which may be related to the Plavix.  I      am going to continue this drug at least through June which will be      1 year after her CYPHER stenting and then we will discuss      discontinuing it.  2. Dyslipidemia, per Dr. Randa Lynn.  She is reluctant to take medications,      but has complied with the regimen above.  3. Hypertension, blood pressure is controlled and she is tolerating a      slightly increased dose of amlodipine from the last visit.  4. Followup, we will see her back in June to make a decision about      potentially discontinuing the Plavix.     Rollene Rotunda, MD, [facility]  Electronically Signed    JH/MedQ  DD: 04/11/2007  DT: 04/11/2007  Job #: [id number]   cc:   Alonna Buckler, MD

## 2010-08-19 NOTE — Op Note (Signed)
[name], [name]            ACCOUNT NO.:  [id number]   MEDICAL RECORD NO.:  [id number]          PATIENT TYPE:  INP   LOCATION:  2899                         FACILITY:  [facility]   PHYSICIAN:  Kathaleen Maser. Pool, M.D.    DATE OF BIRTH:  12/24/1932   DATE OF PROCEDURE:  01/18/2004  DATE OF DISCHARGE:                                 OPERATIVE REPORT   PREOPERATIVE DIAGNOSIS:  Left L4-5 stenosis with radiculopathy.   POSTOPERATIVE DIAGNOSIS:  Left L4-5 stenosis with radiculopathy.   PROCEDURE:  Left L4-5 decompressive laminotomy with foraminotomy.   SURGEON:  Kathaleen Maser. Pool, M.D.   ASSISTANT:  Donalee Citrin, M.D.   ANESTHESIA:  General endotracheal.   INDICATIONS FOR PROCEDURE:  Ms. Cowman is a 77 year old female with history  of back and severe left lower extremity pain, consistent with left-sided L5  radiculopathy.  She has failed conservative measures.  Work-up demonstrated  evidence of significant stenosis off to the left side at L4-5 with  compression of left-sided L5 nerve root.  The patient was counseled as to  her options. She has decided to proceed with left-sided L4-5 decompressive  laminotomy with foraminotomy.   DESCRIPTION OF PROCEDURE:  The patient was  brought to the operating room,  placed on the operating table in the supine position. After an adequate  level of anesthesia was achieved, the patient was positioned prone onto a  Wilson frame, appropriately padding.  The patient's lumbar area was prepped  and draped sterilely.  A #10 blade knife was used to make a incision  overlying the L4-5 interspace. This was carried down sharply in the midline.  Subperiosteal dissection was performed to expose the lamina and facet joints  of L4 and L5.  Deep self-retaining retractors were placed.   Intraoperative x-ray taken, level was found to be the L3-4 level.  Dissection then proceded 1 level inferiorly.  Retractor was replaced.  A  laminotomy was then performed using Kerrison  rongeurs and a high speed drill  to remove the inferior aspect of the L4, medial aspect of the L5  facet  joint and the superior rim of the L5 lamina. Ligamentum flavum was then  elevated and resected in piecemeal fashion using Kerrison rongeurs,  brought  down in line with the thecal sac, and exiting L5 nerve root were identified.  A wide decompressive foraminotomy was then performed using the microscope as  a visual aid.  The L5 nerve root was identified and tracked out to its  foramen.  There was no evidence of any residual stenosis, there was no  evidence of injury to the thecal sac or nerve roots.  The disk itself was  inspected and found to be free from any herniation.  The wound was irrigated  with antibiotic solution.  Gelfoam was placed, operative hemostasis was  found to be good.   Microscope was directed to the nerve root.  Hemostasis in muscle achieved  with electrocautery.  The wound was closed in layers with Vicryl sutures.  Steri-Strips, sterile dressings were applied. There were no apparent  complications.  The patient tolerated the procedure well,  and she returned  to the recovery room.       HAP/MEDQ  D:  01/18/2004  T:  01/18/2004  Job:  [id number]

## 2010-08-31 ENCOUNTER — Observation Stay: Payer: Self-pay | Admitting: *Deleted

## 2010-08-31 DIAGNOSIS — I059 Rheumatic mitral valve disease, unspecified: Secondary | ICD-10-CM

## 2010-09-01 DIAGNOSIS — R55 Syncope and collapse: Secondary | ICD-10-CM

## 2010-09-09 ENCOUNTER — Encounter: Payer: Medicare Other | Admitting: Cardiovascular Disease

## 2010-09-12 ENCOUNTER — Encounter: Payer: Self-pay | Admitting: Cardiology

## 2010-09-13 ENCOUNTER — Telehealth: Payer: Self-pay | Admitting: Cardiology

## 2010-09-13 ENCOUNTER — Telehealth: Payer: Self-pay | Admitting: *Deleted

## 2010-09-13 NOTE — Telephone Encounter (Signed)
Pt went to er may 30 due to syncope stayed over night has questions re the tests that she had done

## 2010-09-13 NOTE — Telephone Encounter (Signed)
Spoke with [name] a great length about an episode she had around May 29 or 30th.  She states she had been walking her dog and passed out.   EMS took her to [facility] and the only thing they found was that her K and Mg were extremely low.  She was instructed to stop Furosemide and wear a "3 day monitor" which according to her didn't show anything.  She followed up with Dr Mariah Milling who wants [name] to wear a 30 day event monitor however she refused and is asking what Dr Antoine Poche would want her to do.  I instruction [name] that Dr Mariah Milling is part of [facility] and that Dr Antoine Poche would want for her to wear the monitor.  She request I ask Dr Antoine Poche himself and call her back.  I will forward to Dr Antoine Poche for recommendations however I did inform the patient that he will more than likely want to evaluate her himself in the office.

## 2010-09-13 NOTE — Telephone Encounter (Signed)
Called pt after receiving notice from [facility] that pt refused to wear. Pt states that she does not want to wear event monitor, and understands that Dr. Mariah Milling recommends s/p hospital (syncope and collapse.) Pt has f/u with Dr. Mariah Milling 6/25, I notified that it would benefit to have her event monitor results for this visit to determine cause of collapse prior to hospital. Pt refuses.

## 2010-09-14 NOTE — Telephone Encounter (Signed)
She can come into the office but I think that the 21 day monitor will be helpful.  I would be happy to see her first to discuss.

## 2010-09-15 ENCOUNTER — Telehealth: Payer: Self-pay | Admitting: Cardiology

## 2010-09-15 ENCOUNTER — Encounter: Payer: Self-pay | Admitting: Cardiology

## 2010-09-15 ENCOUNTER — Ambulatory Visit (INDEPENDENT_AMBULATORY_CARE_PROVIDER_SITE_OTHER): Payer: Medicare Other | Admitting: Cardiology

## 2010-09-15 DIAGNOSIS — R55 Syncope and collapse: Secondary | ICD-10-CM

## 2010-09-15 DIAGNOSIS — R002 Palpitations: Secondary | ICD-10-CM

## 2010-09-15 DIAGNOSIS — I2510 Atherosclerotic heart disease of native coronary artery without angina pectoris: Secondary | ICD-10-CM

## 2010-09-15 DIAGNOSIS — I10 Essential (primary) hypertension: Secondary | ICD-10-CM

## 2010-09-15 NOTE — Assessment & Plan Note (Signed)
The patient has no new sypmtoms.  No further cardiovascular testing is indicated.  We will continue with aggressive risk reduction and meds as listed.  

## 2010-09-15 NOTE — Patient Instructions (Signed)
Follow up with Dr Antoine Poche in 2 months Continue current medications as listed

## 2010-09-15 NOTE — Assessment & Plan Note (Signed)
The etiology of the patient's syncope is not clear. At this point no further cardiovascular testing is suggested. She will let me know if she has any further events

## 2010-09-15 NOTE — Telephone Encounter (Signed)
Pt would like to be seen today to discuss the need of monitor.  appt given for 10:30am

## 2010-09-15 NOTE — Telephone Encounter (Signed)
ROI faxed to [facility],REcords received back gave to Pam/Hochrein Pt appt today 10:15   09/15/10/km

## 2010-09-15 NOTE — Assessment & Plan Note (Signed)
I reviewed her Holter. She does have atrial and ventricular ectopy as previously noted. However, there are no sustained symptomatic dysrhythmias. No further change in therapy is indicated.

## 2010-09-15 NOTE — Progress Notes (Signed)
HPI The patient presents for followup after recent hospitalization for syncope. This was at [facility].  She had a loss of consciousness which was unwitnessed. She ruled out for my card infarction. Echocardiography demonstrated an EF 55%. Carotid Dopplers were negative. Telemetry demonstrated ectopy. She was slightly hypokalemic and had a slightly low magnesium. She wore an outpatient Holter which I have reviewed with her today. This demonstrated sinus rhythm, occasional ventricular ectopy and premature atrial contractions but no sustained dysrhythmia. Since that visit she has had no presyncope or syncope. She has had no chest pressure, neck or arm discomfort. She does notice some palpitations. She reports some labile blood pressures. She has not had any weight gain or edema. He's had some mild shortness of breath which is unchanged from previous but no PND or orthopnea.  Allergies  Allergen Reactions  . Codeine   . Ivp Dye (Iodinated Diagnostic Agents)   . Magnesium-Containing Compounds     Current Outpatient Prescriptions  Medication Sig Dispense Refill  . allopurinol (ZYLOPRIM) 100 MG tablet Take 100 mg by mouth 2 (two) times daily.       Marland Kitchen ALPRAZolam (XANAX) 0.25 MG tablet Take 0.25 mg by mouth at bedtime as needed.        Marland Kitchen aspirin 81 MG tablet Take 81 mg by mouth daily.        . citalopram (CELEXA) 10 MG tablet Take 10 mg by mouth daily.        Marland Kitchen co-enzyme Q-10 30 MG capsule Take 30 mg by mouth 3 (three) times daily.        . hydrochlorothiazide 25 MG tablet Take 25 mg by mouth daily.        Marland Kitchen ibuprofen (ADVIL,MOTRIN) 200 MG tablet Take 200 mg by mouth every 6 (six) hours as needed.        . metoprolol tartrate (LOPRESSOR) 25 MG tablet Take 25 mg by mouth 2 (two) times daily.        . Multiple Vitamin (MULTIVITAMIN) tablet Take 1 tablet by mouth daily.        . nitroGLYCERIN (NITROSTAT) 0.4 MG SL tablet Place 0.4 mg under the tongue every 5 (five) minutes as needed.        . potassium  chloride (KLOR-CON) 20 MEQ packet Take 20 mEq by mouth daily.        . simvastatin (ZOCOR) 40 MG tablet Take 40 mg by mouth at bedtime.        Marland Kitchen DISCONTD: alendronate (FOSAMAX) 70 MG tablet Take 70 mg by mouth every 7 (seven) days. Take with a full glass of water on an empty stomach.       . DISCONTD: torsemide (DEMADEX) 20 MG tablet Take 20 mg by mouth daily.          Past Medical History  Diagnosis Date  . Dyslipidemia   . Depression with anxiety   . Gout   . Hyperlipidemia   . Hypertension   . CAD (coronary artery disease)     95% LAD stenosis, Cypher x 02 September 1996    Past Surgical History  Procedure Date  . Lumbar disc surgery   . Tonsillectomy   . Cardiac catheterization     ROS:  As stated in the HPI and negative for all other systems.  PHYSICAL EXAM BP 118/68  Pulse 72  Resp 18  Ht 5\' 5"  (1.651 m)  Wt 153 lb (69.4 kg)  BMI 25.46 kg/m2 GENERAL:  Well appearing HEENT:  Pupils equal  round and reactive, fundi not visualized, oral mucosa unremarkable NECK:  No jugular venous distention, waveform within normal limits, carotid upstroke brisk and symmetric, no bruits, no thyromegaly LYMPHATICS:  No cervical, inguinal adenopathy LUNGS:  Clear to auscultation bilaterally BACK:  No CVA tenderness CHEST:  Unremarkable HEART:  PMI not displaced or sustained,S1 and S2 within normal limits, no S3, no S4, no clicks, no rubs, no murmurs ABD:  Flat, positive bowel sounds normal in frequency in pitch, no bruits, no rebound, no guarding, no midline pulsatile mass, no hepatomegaly, no splenomegaly EXT:  2 plus pulses throughout, no edema, no cyanosis no clubbing SKIN:  No rashes no nodules NEURO:  Cranial nerves II through XII grossly intact, motor grossly intact throughout PSYCH:  Cognitively intact, oriented to person place and time  ASSESSMENT AND PLAN

## 2010-09-15 NOTE — Assessment & Plan Note (Signed)
Her blood pressure has been labile. She keep a blood pressure diary and I will be happy to review this.

## 2010-09-26 ENCOUNTER — Encounter: Payer: Medicare Other | Admitting: Cardiovascular Disease

## 2010-09-29 ENCOUNTER — Encounter: Payer: Self-pay | Admitting: Cardiology

## 2010-10-01 ENCOUNTER — Other Ambulatory Visit: Payer: Self-pay | Admitting: Cardiology

## 2010-10-11 ENCOUNTER — Encounter: Payer: Self-pay | Admitting: Cardiology

## 2010-11-15 ENCOUNTER — Encounter: Payer: Self-pay | Admitting: Cardiology

## 2010-11-15 ENCOUNTER — Ambulatory Visit (INDEPENDENT_AMBULATORY_CARE_PROVIDER_SITE_OTHER): Payer: Medicare Other | Admitting: Cardiology

## 2010-11-15 DIAGNOSIS — I10 Essential (primary) hypertension: Secondary | ICD-10-CM

## 2010-11-15 DIAGNOSIS — R55 Syncope and collapse: Secondary | ICD-10-CM

## 2010-11-15 DIAGNOSIS — I2510 Atherosclerotic heart disease of native coronary artery without angina pectoris: Secondary | ICD-10-CM

## 2010-11-15 NOTE — Progress Notes (Signed)
HPI The patient presents for followup of syncope. She was hospitalized earlier this year and had a work up with echocardiography demonstrating an EF 55%. Carotid Dopplers were negative. Telemetry demonstrated ectopy. She was slightly hypokalemic and had a slightly low magnesium. She wore an outpatient Holter with no significant arrhythmias.  Since I last saw her she has had 2 other syncopal episodes. One was after she was seated and stood up to take her dogs her walk. Another episode was a few weeks ago while at the beach. She does not feel palpitations. She says she loses consciousness though she does cord trauma. She did end up on the ground at least with one weeks.  She has had no chest pressure, neck or arm discomfort. She does notice some palpitations. She reports some labile blood pressures. She has not had any weight gain or edema. He's had some mild shortness of breath which is unchanged from previous but no PND or orthopnea.  Allergies  Allergen Reactions  . Codeine   . Ivp Dye (Iodinated Diagnostic Agents)   . Magnesium-Containing Compounds     Current Outpatient Prescriptions  Medication Sig Dispense Refill  . allopurinol (ZYLOPRIM) 100 MG tablet Take 100 mg by mouth 2 (two) times daily.       Marland Kitchen ALPRAZolam (XANAX) 0.25 MG tablet Take 0.25 mg by mouth at bedtime as needed.        Marland Kitchen aspirin 81 MG tablet Take 81 mg by mouth daily.        . citalopram (CELEXA) 10 MG tablet Take 10 mg by mouth daily.        Marland Kitchen co-enzyme Q-10 30 MG capsule Take 30 mg by mouth 3 (three) times daily.        . hydrochlorothiazide 25 MG tablet Take 25 mg by mouth daily.        Marland Kitchen ibuprofen (ADVIL,MOTRIN) 200 MG tablet Take 200 mg by mouth every 6 (six) hours as needed.        . metoprolol tartrate (LOPRESSOR) 25 MG tablet Take 25 mg by mouth 2 (two) times daily.        . Multiple Vitamin (MULTIVITAMIN) tablet Take 1 tablet by mouth daily.        Marland Kitchen NITROSTAT 0.4 MG SL tablet DISSOLVE 1 UNDER TONGUE AS NEEDED FOR  CHEST PAIN MAY REPEAT EVERY 5 MIN FOR A TOTAL OF 3 DOSES  25 tablet  11  . potassium chloride SA (K-DUR,KLOR-CON) 20 MEQ tablet Take 20 mEq by mouth daily.        . simvastatin (ZOCOR) 40 MG tablet Take 40 mg by mouth at bedtime.          Past Medical History  Diagnosis Date  . Dyslipidemia   . Depression with anxiety   . Gout   . Hyperlipidemia   . Hypertension   . CAD (coronary artery disease)     95% LAD stenosis, Cypher x 02 September 1996    Past Surgical History  Procedure Date  . Lumbar disc surgery   . Tonsillectomy   . Cardiac catheterization     ROS:  As stated in the HPI and negative for all other systems.  PHYSICAL EXAM BP 118/71  Pulse 71  Ht 5\' 5"  (1.651 m)  Wt 156 lb (70.761 kg)  BMI 25.96 kg/m2 GENERAL:  Well appearing HEENT:  Pupils equal round and reactive, fundi not visualized, oral mucosa unremarkable NECK:  No jugular venous distention, waveform within normal limits, carotid upstroke brisk  and symmetric, no bruits, no thyromegaly LYMPHATICS:  No cervical, inguinal adenopathy LUNGS:  Clear to auscultation bilaterally BACK:  No CVA tenderness CHEST:  Unremarkable HEART:  PMI not displaced or sustained,S1 and S2 within normal limits, no S3, no S4, no clicks, no rubs, no murmurs ABD:  Flat, positive bowel sounds normal in frequency in pitch, no bruits, no rebound, no guarding, no midline pulsatile mass, no hepatomegaly, no splenomegaly EXT:  2 plus pulses throughout, no edema, no cyanosis no clubbing SKIN:  No rashes no nodules NEURO:  Cranial nerves II through XII grossly intact, motor grossly intact throughout PSYCH:  Cognitively intact, oriented to person place and time  ASSESSMENT AND PLAN

## 2010-11-15 NOTE — Assessment & Plan Note (Signed)
Today I will check orthostatics. I will also apply 21 day event monitor. She might need a loop monitor eventually.

## 2010-11-15 NOTE — Assessment & Plan Note (Signed)
Her blood pressure is somewhat labile. However, for now I will continue meds as listed.

## 2010-11-15 NOTE — Assessment & Plan Note (Signed)
She is having no new symptoms and I do not think this is an ischemic equivalent. She will continue with risk reduction.

## 2010-11-15 NOTE — Patient Instructions (Signed)
The current medical regimen is effective;  continue present plan and medications.  Your physician has recommended that you wear an event monitor for 21 days. Event monitors are medical devices that record the heart's electrical activity. Doctors most often [place] these monitors to diagnose arrhythmias. Arrhythmias are problems with the speed or rhythm of the heartbeat. The monitor is a small, portable device. You can wear one while you do your normal daily activities. This is usually used to diagnose what is causing palpitations/syncope (passing out).  Please follow up with Dr Antoine Poche in 1 month.

## 2010-11-17 ENCOUNTER — Encounter (INDEPENDENT_AMBULATORY_CARE_PROVIDER_SITE_OTHER): Payer: Medicare Other

## 2010-11-17 DIAGNOSIS — R55 Syncope and collapse: Secondary | ICD-10-CM

## 2010-12-16 ENCOUNTER — Ambulatory Visit: Payer: Medicare Other | Admitting: Cardiology

## 2010-12-19 ENCOUNTER — Telehealth: Payer: Self-pay | Admitting: Cardiology

## 2010-12-19 NOTE — Telephone Encounter (Signed)
Spoke with pt who states she was not aware of her appointment last week.   It has been rescheduled.  I will call pt back with the monitor results.  She is in agreement.

## 2010-12-19 NOTE — Telephone Encounter (Signed)
Pt wanted to know results of Holter Monitor. Please return pt call to discuss further.

## 2010-12-20 NOTE — Telephone Encounter (Signed)
Attempted to call pt with results of monitor which demonstrates NSR with rare PVC's.  No answer at pts home number.

## 2011-01-05 NOTE — Telephone Encounter (Signed)
Pt aware of results 

## 2011-01-17 ENCOUNTER — Ambulatory Visit (INDEPENDENT_AMBULATORY_CARE_PROVIDER_SITE_OTHER): Payer: Medicare Other | Admitting: Cardiology

## 2011-01-17 ENCOUNTER — Encounter: Payer: Self-pay | Admitting: Cardiology

## 2011-01-17 VITALS — BP 127/76 | HR 84 | Ht 65.0 in | Wt 159.0 lb

## 2011-01-17 DIAGNOSIS — I2510 Atherosclerotic heart disease of native coronary artery without angina pectoris: Secondary | ICD-10-CM

## 2011-01-17 DIAGNOSIS — R55 Syncope and collapse: Secondary | ICD-10-CM

## 2011-01-17 DIAGNOSIS — I10 Essential (primary) hypertension: Secondary | ICD-10-CM

## 2011-01-17 NOTE — Progress Notes (Signed)
HPI The patient presents for followup of syncope. She was hospitalized earlier this year and had a work up with echocardiography demonstrating an EF 55%. Carotid Dopplers were negative. Telemetry demonstrated ectopy. She was slightly hypokalemic and had a slightly low magnesium. She wore an outpatient Holter with no significant arrhythmias.  She has also had worn a 21 day event monitor.  This did not "stick" very well per her report but it showed no arrhythmias.  She didn't have any syncope while wearing it.  She has had three syncopal episodes since this [place] hospitalizations.     She has been up and moving with this but she has not been exerting herself.  She wakes up on the ground.  She has not had trauma.  She has not had palpitations or pain.  She has had no new SOB, PND or orthopnea.    She has had no weight gain or edema.  Allergies  Allergen Reactions  . Codeine   . Ivp Dye (Iodinated Diagnostic Agents)   . Magnesium-Containing Compounds     Current Outpatient Prescriptions  Medication Sig Dispense Refill  . allopurinol (ZYLOPRIM) 100 MG tablet Take 100 mg by mouth 2 (two) times daily.       Marland Kitchen ALPRAZolam (XANAX) 0.25 MG tablet Take 0.25 mg by mouth at bedtime as needed.        Marland Kitchen aspirin 81 MG tablet Take 81 mg by mouth daily.        . citalopram (CELEXA) 10 MG tablet Take 10 mg by mouth daily.        Marland Kitchen co-enzyme Q-10 30 MG capsule Take 30 mg by mouth 3 (three) times daily.        . hydrochlorothiazide 25 MG tablet Take 25 mg by mouth daily.        Marland Kitchen ibuprofen (ADVIL,MOTRIN) 200 MG tablet Take 200 mg by mouth every 6 (six) hours as needed.        . metoprolol tartrate (LOPRESSOR) 25 MG tablet Take 25 mg by mouth 2 (two) times daily.        . Multiple Vitamin (MULTIVITAMIN) tablet Take 1 tablet by mouth daily.        Marland Kitchen NITROSTAT 0.4 MG SL tablet DISSOLVE 1 UNDER TONGUE AS NEEDED FOR CHEST PAIN MAY REPEAT EVERY 5 MIN FOR A TOTAL OF 3 DOSES  25 tablet  11  . potassium chloride SA  (K-DUR,KLOR-CON) 20 MEQ tablet Take 20 mEq by mouth daily.        . simvastatin (ZOCOR) 40 MG tablet Take 40 mg by mouth at bedtime.          Past Medical History  Diagnosis Date  . Dyslipidemia   . Depression with anxiety   . Gout   . Hyperlipidemia   . Hypertension   . CAD (coronary artery disease)     95% LAD stenosis, Cypher x 02 September 1996    Past Surgical History  Procedure Date  . Lumbar disc surgery   . Tonsillectomy   . Cardiac catheterization     ROS:  As stated in the HPI and negative for all other systems.  PHYSICAL EXAM BP 126/76  Pulse 81  Ht 5\' 5"  (1.651 m)  Wt 159 lb (72.122 kg)  BMI 26.46 kg/m2 GENERAL:  Well appearing HEENT:  Pupils equal round and reactive, fundi not visualized, oral mucosa unremarkable NECK:  No jugular venous distention, waveform within normal limits, carotid upstroke brisk and symmetric, no bruits, no thyromegaly LYMPHATICS:  No cervical, inguinal adenopathy LUNGS:  Clear to auscultation bilaterally BACK:  No CVA tenderness CHEST:  Unremarkable HEART:  PMI not displaced or sustained,S1 and S2 within normal limits, no S3, no S4, no clicks, no rubs, no murmurs ABD:  Flat, positive bowel sounds normal in frequency in pitch, no bruits, no rebound, no guarding, no midline pulsatile mass, no hepatomegaly, no splenomegaly EXT:  2 plus pulses throughout, no edema, no cyanosis no clubbing SKIN:  No rashes no nodules NEURO:  Cranial nerves II through XII grossly intact, motor grossly intact throughout PSYCH:  Cognitively intact, oriented to person place and time  ASSESSMENT AND PLAN

## 2011-01-17 NOTE — Assessment & Plan Note (Signed)
The blood pressure is at target. No change in medications is indicated. We will continue with therapeutic lifestyle changes (TLC).  

## 2011-01-17 NOTE — Patient Instructions (Signed)
The current medical regimen is effective;  continue present plan and medications.  You have been referred to EP for possible loop monitor placement

## 2011-01-17 NOTE — Assessment & Plan Note (Signed)
At this point I will refer her to EP for consideration of loop recorder implant.

## 2011-01-17 NOTE — Assessment & Plan Note (Signed)
I will need to review the [place] hospitalization to see if she has had a stress test recently.  However, I do not suspect that this is an ischemic event.

## 2011-02-01 ENCOUNTER — Ambulatory Visit: Payer: Self-pay | Admitting: Internal Medicine

## 2011-03-14 ENCOUNTER — Telehealth: Payer: Self-pay | Admitting: Cardiology

## 2011-03-14 NOTE — Telephone Encounter (Signed)
Walk in pt Form " Pt has Questions" sent to [facility]  03/14/11/km

## 2011-03-15 ENCOUNTER — Other Ambulatory Visit: Payer: Self-pay | Admitting: Anesthesiology

## 2011-03-15 ENCOUNTER — Other Ambulatory Visit: Payer: Self-pay | Admitting: Cardiology

## 2011-03-15 DIAGNOSIS — M545 Low back pain: Secondary | ICD-10-CM

## 2011-03-23 ENCOUNTER — Ambulatory Visit
Admission: RE | Admit: 2011-03-23 | Discharge: 2011-03-23 | Disposition: A | Discharge status: Home / Self Care | Payer: Medicare Other | Source: Ambulatory Visit | Attending: Anesthesiology | Admitting: Anesthesiology

## 2011-03-23 DIAGNOSIS — M545 Low back pain: Secondary | ICD-10-CM

## 2011-03-23 MED ORDER — GADOBENATE DIMEGLUMINE 529 MG/ML IV SOLN
14.0000 mL | Freq: Once | INTRAVENOUS | Status: AC | PRN
  Administered 2011-03-23: 14 mL via INTRAVENOUS

## 2011-07-11 ENCOUNTER — Encounter: Payer: Self-pay | Admitting: Internal Medicine

## 2011-07-11 ENCOUNTER — Ambulatory Visit (INDEPENDENT_AMBULATORY_CARE_PROVIDER_SITE_OTHER): Payer: Medicare Other | Admitting: Internal Medicine

## 2011-07-11 VITALS — BP 122/84 | HR 88 | Ht 65.0 in | Wt 150.0 lb

## 2011-07-11 DIAGNOSIS — I2510 Atherosclerotic heart disease of native coronary artery without angina pectoris: Secondary | ICD-10-CM

## 2011-07-11 DIAGNOSIS — R55 Syncope and collapse: Secondary | ICD-10-CM

## 2011-07-11 NOTE — Patient Instructions (Signed)
Your physician has requested that you have en exercise stress myoview. For further information please visit [email or web address]. Please follow instruction sheet, as given.   

## 2011-07-11 NOTE — Progress Notes (Signed)
HPI The patient is a very pleasant 78 yo woman referred by Dr. Antoine Poche for evalutation of unexplained syncope. She has not had an episode for several weeks. The occur when she is walking and there is no associated loss of bowel or bladder continence or tongue biting. She denies chest pain or sob. The patient has known CAD and also has recently been experiencing sob and chest pressure. She has refused stress testing in the past. She does not relate these symptoms to her syncope.  Allergies  Allergen Reactions  . Codeine   . Ivp Dye (Iodinated Diagnostic Agents)   . Magnesium-Containing Compounds      Current Outpatient Prescriptions  Medication Sig Dispense Refill  . allopurinol (ZYLOPRIM) 100 MG tablet Take 100 mg by mouth 2 (two) times daily.       Marland Kitchen ALPRAZolam (XANAX) 0.25 MG tablet Take 0.25 mg by mouth at bedtime as needed.        Marland Kitchen aspirin 81 MG tablet Take 81 mg by mouth daily.        Marland Kitchen co-enzyme Q-10 30 MG capsule Take 30 mg by mouth 3 (three) times daily.        . hydrochlorothiazide 25 MG tablet Take 25 mg by mouth daily.        Marland Kitchen ibuprofen (ADVIL,MOTRIN) 200 MG tablet Take 200 mg by mouth every 6 (six) hours as needed.        Marland Kitchen KLOR-CON M20 20 MEQ tablet TAKE 1 TABLET BY MOUTH EVERY DAY  30 tablet  4  . Multiple Vitamin (MULTIVITAMIN) tablet Take 1 tablet by mouth daily.        Marland Kitchen NITROSTAT 0.4 MG SL tablet DISSOLVE 1 UNDER TONGUE AS NEEDED FOR CHEST PAIN MAY REPEAT EVERY 5 MIN FOR A TOTAL OF 3 DOSES  25 tablet  11  . simvastatin (ZOCOR) 40 MG tablet Take 40 mg by mouth at bedtime.           Past Medical History  Diagnosis Date  . Dyslipidemia   . Depression with anxiety   . Gout   . Hyperlipidemia   . Hypertension   . CAD (coronary artery disease)     95% LAD stenosis, Cypher x 02 September 1996    ROS:   All systems reviewed and negative except as noted in the HPI.   Past Surgical History  Procedure Date  . Lumbar disc surgery   . Tonsillectomy   . Cardiac  catheterization      Family History  Problem Relation Age of Onset  . Heart disease Brother   . Heart disease Other     First degree relatives with heart disease but later onset     History   Social History  . Marital Status: Widowed    Spouse Name: N/A    Number of Children: N/A  . Years of Education: N/A   Occupational History  . Not on file.   Social History Main Topics  . Smoking status: Never Smoker   . Smokeless tobacco: Not on file  . Alcohol Use: Yes     Occasionally drinks beer  . Drug Use: Not on file  . Sexually Active: Not on file   Other Topics Concern  . Not on file   Social History Narrative   Widowed with one son     BP 122/84  Pulse 88  Ht 5\' 5"  (1.651 m)  Wt 68.04 kg (150 lb)  BMI 24.96 kg/m2  Physical Exam:  Elderly  but Well appearing woman, NAD HEENT: Unremarkable Neck:  No JVD, no thyromegally Lungs:  Clear with minimal basilar rales. HEART:  Regular rate rhythm, no murmurs, no rubs, no clicks Abd:  soft, positive bowel sounds, no organomegally, no rebound, no guarding Ext:  2 plus pulses, no edema, no cyanosis, no clubbing Skin:  No rashes no nodules Neuro:  CN II through XII intact, motor grossly intact  EKG NSR with nonspecific STT abnormality  Assess/Plan:

## 2011-07-13 ENCOUNTER — Encounter: Payer: Self-pay | Admitting: Internal Medicine

## 2011-07-13 NOTE — Assessment & Plan Note (Signed)
The etiology is unclear. I have discussed the utility of proceeding with ILR but at this time she wants to reflect on this.

## 2011-07-13 NOTE — Assessment & Plan Note (Signed)
She is c/o chest pressure and sob. She has known CAD. I have recommended a screening stress myoview as she has known disease and is unable to ambulate and has an abnormal ECG at baseline.

## 2011-07-17 ENCOUNTER — Ambulatory Visit: Payer: Medicare Other | Admitting: Internal Medicine

## 2011-07-17 ENCOUNTER — Ambulatory Visit (HOSPITAL_COMMUNITY): Payer: Medicare Other | Attending: Internal Medicine | Admitting: Radiology

## 2011-07-17 VITALS — BP 157/90 | Ht 65.0 in | Wt 160.0 lb

## 2011-07-17 DIAGNOSIS — I2510 Atherosclerotic heart disease of native coronary artery without angina pectoris: Secondary | ICD-10-CM

## 2011-07-17 DIAGNOSIS — R0989 Other specified symptoms and signs involving the circulatory and respiratory systems: Secondary | ICD-10-CM | POA: Insufficient documentation

## 2011-07-17 DIAGNOSIS — R0789 Other chest pain: Secondary | ICD-10-CM | POA: Insufficient documentation

## 2011-07-17 DIAGNOSIS — Z01810 Encounter for preprocedural cardiovascular examination: Secondary | ICD-10-CM | POA: Insufficient documentation

## 2011-07-17 DIAGNOSIS — Z8249 Family history of ischemic heart disease and other diseases of the circulatory system: Secondary | ICD-10-CM | POA: Insufficient documentation

## 2011-07-17 DIAGNOSIS — R000 Tachycardia, unspecified: Secondary | ICD-10-CM | POA: Insufficient documentation

## 2011-07-17 DIAGNOSIS — I10 Essential (primary) hypertension: Secondary | ICD-10-CM | POA: Insufficient documentation

## 2011-07-17 DIAGNOSIS — R55 Syncope and collapse: Secondary | ICD-10-CM | POA: Insufficient documentation

## 2011-07-17 DIAGNOSIS — R002 Palpitations: Secondary | ICD-10-CM | POA: Insufficient documentation

## 2011-07-17 DIAGNOSIS — R0609 Other forms of dyspnea: Secondary | ICD-10-CM | POA: Insufficient documentation

## 2011-07-17 DIAGNOSIS — R079 Chest pain, unspecified: Secondary | ICD-10-CM

## 2011-07-17 DIAGNOSIS — R5381 Other malaise: Secondary | ICD-10-CM | POA: Insufficient documentation

## 2011-07-17 DIAGNOSIS — E785 Hyperlipidemia, unspecified: Secondary | ICD-10-CM | POA: Insufficient documentation

## 2011-07-17 MED ORDER — TECHNETIUM TC 99M TETROFOSMIN IV KIT
11.0000 | PACK | Freq: Once | INTRAVENOUS | Status: AC | PRN
  Administered 2011-07-17: 11 via INTRAVENOUS

## 2011-07-17 MED ORDER — TECHNETIUM TC 99M TETROFOSMIN IV KIT
33.0000 | PACK | Freq: Once | INTRAVENOUS | Status: AC | PRN
  Administered 2011-07-17: 33 via INTRAVENOUS

## 2011-07-17 NOTE — Progress Notes (Signed)
[facility] 3 NUCLEAR MED [address] [place] [address] [phone]  Cardiology Nuclear Med Study  [name] is a 78 y.o. female     MRN : [id number]     DOB: 12-21-1932  Procedure Date: 07/17/2011  Nuclear Med Background Indication for Stress Test:  Evaluation for Ischemia and Pending Clearance for Back Surgery by Dr. Jordan Likes History:  '08 Stent x 2-LAD; '10 VZD:GLOV apical thinning, no ischemia, EF=74%. Cardiac Risk Factors: Family History - CAD, Hypertension and Lipids  Symptoms:  Chest Tightness with and without Exertion (last episode of chest discomfort was about one week ago), DOE, Fatigue, Palpitations, Rapid HR and Syncope (last episode was about 3-4 months ago)   Nuclear Pre-Procedure Caffeine/Decaff Intake:  None NPO After: 4:00pm   Lungs:  Clear. IV 0.9% NS with Angio Cath:  20g  IV Site: R Antecubital  IV Started by:  Stanton Kidney, EMT-P  Chest Size (in):  38 Cup Size: C  Height: 5\' 5"  (1.651 m)  Weight:  160 lb (72.576 kg)  BMI:  Body mass index is 26.63 kg/(m^2). Tech Comments:  Metoprolol held this a.m.    Nuclear Med Study 1 or 2 day study: 1 day  Stress Test Type: Modified Stress  Reading MD: Dietrich Pates, MD  Order Authorizing Provider:  Lewayne Bunting, MD  Resting Radionuclide: Technetium 23m Tetrofosmin  Resting Radionuclide Dose: 11.0 mCi   Stress Radionuclide:  Technetium 41m Tetrofosmin  Stress Radionuclide Dose: 33.0 mCi           Stress Protocol Rest HR: 80 Stress HR: 153  Rest BP: (Sitting)165/92  (Standing)157/90 Stress BP: 208/98  Exercise Time (min): 6:00 METS: 3.4   Predicted Max HR: 143 bpm % Max HR: 106.99 bpm Rate Pressure Product: 56433   Dose of Adenosine (mg):  n/a Dose of Lexiscan: n/a mg  Dose of Atropine (mg): n/a Dose of Dobutamine: n/a mcg/kg/min (at max HR)  Stress Test Technologist: Smiley Houseman, CMA-N  Nuclear Technologist:  Domenic Polite, CNMT     Rest Procedure:  Myocardial perfusion  imaging was performed at rest 45 minutes following the intravenous administration of Technetium 43m Tetrofosmin.  Rest ECG: Nonspecific ST-T wave changes.  Stress Procedure:  The patient exercised on the treadmill for six minutes utilizing the Bruce protocol. She then stopped due to fatigue and denied any chest pain.  There were no diagnostic ST-T wave changes; occasional PVC's with couplets and PAC's were noted.  She did have a hypertensive response, 208/98.  Technetium 59m Tetrofosmin was injected at peak exercise and myocardial perfusion imaging was performed after a brief delay.  Stress ECG: No significant change from baseline ECG  QPS Raw Data Images:  images were motion corrected.  Soft tissue (diaphragm, bowel activity, breast) surround heart. Stress Images:  Normal homogeneous uptake in all areas of the myocardium. Rest Images:  Normal homogeneous uptake in all areas of the myocardium. Subtraction (SDS):  No evidence of ischemia. Transient Ischemic Dilatation (Normal <1.22):  0.86 Lung/Heart Ratio (Normal <0.45):  0.42  Quantitative Gated Spect Images QGS EDV:  72 ml QGS ESV:  23 ml  Impression Exercise Capacity:  Fair exercise capacity. BP Response:  Normal blood pressure response. Clinical Symptoms:  No chest pain. ECG Impression:  No significant ST segment change suggestive of ischemia. Comparison with Prior Nuclear Study: No significant change from prior report.  Overall Impression:  Normal stress nuclear study.  LV Ejection Fraction: 68%.  LV Wall Motion:  NL LV Function; NL Wall Motion Dietrich Pates

## 2011-07-21 ENCOUNTER — Telehealth: Payer: Self-pay | Admitting: Internal Medicine

## 2011-07-21 NOTE — Telephone Encounter (Signed)
Pt wanted to make sure she still needed to see Dr. Antoine Poche every 6 months. She has appt on 08/07/11 Will forward to Corey Skains RN

## 2011-07-21 NOTE — Telephone Encounter (Signed)
Patient wants to speak with nurse, she can be reache at [phone]

## 2011-07-21 NOTE — Telephone Encounter (Signed)
Per Dr Antoine Poche pt can follow up in 6 months with him.  She will be put in recall.

## 2011-07-21 NOTE — Telephone Encounter (Signed)
New problem:  Patient calling back for test results  

## 2011-07-21 NOTE — Telephone Encounter (Signed)
LMTCB--nt 

## 2011-08-07 ENCOUNTER — Ambulatory Visit: Payer: Medicare Other | Admitting: Cardiology

## 2011-09-12 ENCOUNTER — Other Ambulatory Visit: Payer: Self-pay | Admitting: Cardiology

## 2012-01-02 ENCOUNTER — Other Ambulatory Visit: Payer: Self-pay | Admitting: Cardiology

## 2012-01-16 LAB — COMPREHENSIVE METABOLIC PANEL: ALT: 44 U/L — AB (ref 7–35)

## 2012-01-16 LAB — HEMOGLOBIN A1C: A1c: 6.7

## 2012-01-16 LAB — LIPID PANEL
HDL: 38 mg/dL (ref 35–70)
LDL (calc): 93.6

## 2012-01-16 LAB — CBC: WBC: 8.6

## 2012-01-16 LAB — MICROALBUMIN / CREATININE URINE RATIO: MICROALB/CREAT RATIO: 7

## 2012-01-26 ENCOUNTER — Ambulatory Visit: Payer: Medicare Other | Admitting: Cardiology

## 2012-02-07 ENCOUNTER — Ambulatory Visit: Payer: Self-pay | Admitting: Internal Medicine

## 2012-02-19 ENCOUNTER — Ambulatory Visit: Payer: Medicare Other | Admitting: Cardiology

## 2012-03-04 ENCOUNTER — Ambulatory Visit (INDEPENDENT_AMBULATORY_CARE_PROVIDER_SITE_OTHER): Payer: Medicare Other | Admitting: Cardiology

## 2012-03-04 ENCOUNTER — Encounter: Payer: Self-pay | Admitting: Cardiology

## 2012-03-04 VITALS — BP 160/80 | HR 122 | Ht 65.0 in | Wt 162.0 lb

## 2012-03-04 DIAGNOSIS — I2510 Atherosclerotic heart disease of native coronary artery without angina pectoris: Secondary | ICD-10-CM

## 2012-03-04 DIAGNOSIS — E785 Hyperlipidemia, unspecified: Secondary | ICD-10-CM

## 2012-03-04 MED ORDER — METOPROLOL TARTRATE 50 MG PO TABS: 50.0000 mg | ORAL_TABLET | Freq: Two times a day (BID) | ORAL | Status: DC

## 2012-03-04 NOTE — Patient Instructions (Addendum)
Please increase your Metoprolol to 50 mg twice a day. Continue all other medications as listed  Please have blood work today (TSH)  Follow up in 3 months with Dr Antoine Poche.

## 2012-03-04 NOTE — Progress Notes (Signed)
HPI The patient presents for followup of syncope. I did send her to see Dr. Ladona Ridgel who saw her in the spring to consider a loop monitor. However, she didn't really want one of these and has since stopped having syncope. She did have some chest discomfort and had a stress perfusion study which demonstrated no evidence of ischemia or infarct. She says she's actually felt well although her heart rate has been increased. She feels a rapid rate frequently. She doesn't have any presyncope with this and doesn't really deal any palpitations. She's not having any further chest discomfort, neck or arm discomfort. She's not having any PND or orthopnea. She's had no weight gain or edema.  Allergies  Allergen Reactions  . Ivp Dye (Iodinated Diagnostic Agents)   . Prednisone     Current Outpatient Prescriptions  Medication Sig Dispense Refill  . ALPRAZolam (XANAX) 0.25 MG tablet Take 0.25 mg by mouth at bedtime as needed.        Marland Kitchen aspirin 81 MG tablet Take 81 mg by mouth daily.        Marland Kitchen co-enzyme Q-10 30 MG capsule Take 30 mg by mouth 3 (three) times daily.        . hydrochlorothiazide 25 MG tablet Take 25 mg by mouth daily.        Marland Kitchen ibuprofen (ADVIL,MOTRIN) 200 MG tablet Take 200 mg by mouth every 6 (six) hours as needed.        Marland Kitchen KLOR-CON M20 20 MEQ tablet TAKE 1 TABLET BY MOUTH EVERY DAY  30 tablet  9  . metoprolol tartrate (LOPRESSOR) 25 MG tablet Take 1 tablet (25 mg total) by mouth 2 (two) times daily.  60 tablet  11  . Multiple Vitamin (MULTIVITAMIN) tablet Take 1 tablet by mouth daily.        Marland Kitchen NITROSTAT 0.4 MG SL tablet DISSOLVE 1 UNDER TONGUE AS NEEDED FOR CHEST PAIN MAY REPEAT EVERY 5 MIN FOR A TOTAL OF 3 DOSES  25 tablet  11  . simvastatin (ZOCOR) 40 MG tablet Take 40 mg by mouth at bedtime.          Past Medical History  Diagnosis Date  . Dyslipidemia   . Depression with anxiety   . Gout   . Hyperlipidemia   . Hypertension   . CAD (coronary artery disease)     95% LAD stenosis,  Cypher x 02 September 1996    Past Surgical History  Procedure Date  . Lumbar disc surgery   . Tonsillectomy   . Cardiac catheterization     ROS:  As stated in the HPI and negative for all other systems.  PHYSICAL EXAM BP 160/80  Pulse 122  Ht 5\' 5"  (1.651 m)  Wt 162 lb (73.483 kg)  BMI 26.96 kg/m2 GENERAL:  Well appearing HEENT:  Pupils equal round and reactive, fundi not visualized, oral mucosa unremarkable NECK:  No jugular venous distention, waveform within normal limits, carotid upstroke brisk and symmetric, no bruits, no thyromegaly LYMPHATICS:  No cervical, inguinal adenopathy LUNGS:  Clear to auscultation bilaterally BACK:  No CVA tenderness CHEST:  Unremarkable HEART:  PMI not displaced or sustained,S1 and S2 within normal limits, no S3, no S4, no clicks, no rubs, no murmurs ABD:  Flat, positive bowel sounds normal in frequency in pitch, no bruits, no rebound, no guarding, no midline pulsatile mass, no hepatomegaly, no splenomegaly EXT:  2 plus pulses throughout, no edema, no cyanosis no clubbing SKIN:  No rashes no nodules  NEURO:  Cranial nerves II through XII grossly intact, motor grossly intact throughout PSYCH:  Cognitively intact, oriented to person place and time  EKG:  Sinus tachycardia, rate 122, premature atrial contractions, prolonged QT, nonspecific diffuse T wave flattening.  03/04/2012   ASSESSMENT AND PLAN  Syncope -  She's not having further syncope. She did not want a loop monitor in the past and at this point one is not indicated.  CAD -  She had a negative stress perfusion study earlier this year. No change in therapy is indicated.  ESSENTIAL HYPERTENSION, BENIGN -  Her blood pressure is elevated but this will be treated in the context of managing her tachycardia.  Tachycardia - I'm going to double her metoprolol and check a TSH. I suggested a Holter monitor but she does not want to wear another monitor.

## 2012-04-03 DIAGNOSIS — I509 Heart failure, unspecified: Secondary | ICD-10-CM

## 2012-04-03 HISTORY — DX: Heart failure, unspecified: I50.9

## 2012-04-14 ENCOUNTER — Inpatient Hospital Stay: Payer: Self-pay | Admitting: Internal Medicine

## 2012-04-14 LAB — COMPREHENSIVE METABOLIC PANEL
Albumin: 3.9 g/dL (ref 3.4–5.0)
Calcium, Total: 9.4 mg/dL (ref 8.5–10.1)
Co2: 29 mmol/L (ref 21–32)
EGFR (Non-African Amer.): 55 — ABNORMAL LOW
Potassium: 4.1 mmol/L (ref 3.5–5.1)
Sodium: 140 mmol/L (ref 136–145)
Total Protein: 7.2 g/dL (ref 6.4–8.2)

## 2012-04-14 LAB — URINALYSIS, COMPLETE
Bacteria: NONE SEEN
Bilirubin,UR: NEGATIVE
Blood: NEGATIVE
Glucose,UR: NEGATIVE mg/dL (ref 0–75)
Hyaline Cast: 3
Nitrite: NEGATIVE
Ph: 7 (ref 4.5–8.0)
RBC,UR: 1 /HPF (ref 0–5)
Squamous Epithelial: 1
WBC UR: 3 /HPF (ref 0–5)

## 2012-04-14 LAB — TROPONIN I
Troponin-I: <0.02 ng/mL
Troponin-I: <0.02 ng/mL

## 2012-04-14 LAB — CBC
HGB: 13.6 g/dL (ref 12.0–16.0)
MCH: 33.2 pg (ref 26.0–34.0)
MCHC: 33.4 g/dL (ref 32.0–36.0)
MCV: 100 fL (ref 80–100)
Platelet: 276 10*3/uL (ref 150–440)
RBC: 4.08 10*6/uL (ref 3.80–5.20)
WBC: 14.1 10*3/uL — ABNORMAL HIGH (ref 3.6–11.0)

## 2012-04-14 LAB — CK TOTAL AND CKMB (NOT AT ARMC): CK-MB: 0.7 ng/mL (ref 0.5–3.6)

## 2012-04-14 LAB — MAGNESIUM: Magnesium: 1.4 mg/dL — ABNORMAL LOW

## 2012-04-15 DIAGNOSIS — I059 Rheumatic mitral valve disease, unspecified: Secondary | ICD-10-CM

## 2012-04-15 DIAGNOSIS — I4891 Unspecified atrial fibrillation: Secondary | ICD-10-CM

## 2012-04-15 DIAGNOSIS — R55 Syncope and collapse: Secondary | ICD-10-CM

## 2012-04-15 LAB — CBC WITH DIFFERENTIAL/PLATELET
Basophil #: 0 10*3/uL (ref 0.0–0.1)
Eosinophil #: 0 10*3/uL (ref 0.0–0.7)
Eosinophil %: 0 %
Lymphocyte #: 0.9 10*3/uL — ABNORMAL LOW (ref 1.0–3.6)
Lymphocyte %: 10.9 %
MCH: 33.9 pg (ref 26.0–34.0)
MCHC: 33.9 g/dL (ref 32.0–36.0)
MCV: 100 fL (ref 80–100)
Monocyte %: 1.5 %
Platelet: 232 10*3/uL (ref 150–440)
RDW: 12.6 % (ref 11.5–14.5)
WBC: 8.2 10*3/uL (ref 3.6–11.0)

## 2012-04-15 LAB — LIPID PANEL
Cholesterol: 199 mg/dL (ref 0–200)
HDL Cholesterol: 38 mg/dL — ABNORMAL LOW (ref 40–60)
Triglycerides: 136 mg/dL (ref 0–200)
VLDL Cholesterol, Calc: 27 mg/dL (ref 5–40)

## 2012-04-15 LAB — HEMOGLOBIN A1C: Hemoglobin A1C: 7.2 % — ABNORMAL HIGH (ref 4.2–6.3)

## 2012-04-15 LAB — BASIC METABOLIC PANEL
BUN: 23 mg/dL — ABNORMAL HIGH (ref 7–18)
EGFR (African American): >60
EGFR (Non-African Amer.): >60
Osmolality: 289 (ref 275–301)
Potassium: 3.8 mmol/L (ref 3.5–5.1)
Sodium: 139 mmol/L (ref 136–145)

## 2012-04-15 LAB — TSH: Thyroid Stimulating Horm: 0.813 u[IU]/mL

## 2012-04-15 LAB — TROPONIN I: Troponin-I: <0.02 ng/mL

## 2012-04-17 ENCOUNTER — Telehealth: Payer: Self-pay | Admitting: Cardiology

## 2012-04-17 LAB — BASIC METABOLIC PANEL
Co2: 27 mmol/L (ref 21–32)
Creatinine: 0.98 mg/dL (ref 0.60–1.30)
Glucose: 138 mg/dL — ABNORMAL HIGH (ref 65–99)
Potassium: 4.3 mmol/L (ref 3.5–5.1)
Sodium: 143 mmol/L (ref 136–145)

## 2012-04-17 LAB — DIGOXIN LEVEL: Digoxin: 1.05 ng/mL

## 2012-04-17 LAB — MAGNESIUM: Magnesium: 2.2 mg/dL

## 2012-04-17 NOTE — Telephone Encounter (Signed)
New problem:   Recently discharge from [facility] today. On Critical Care for 4 days. Need to discuss

## 2012-04-18 NOTE — Telephone Encounter (Signed)
NA after multiple rings.  Will continue to attempt to contact.

## 2012-04-25 NOTE — Telephone Encounter (Signed)
Left message for pt to call.

## 2012-04-30 LAB — BASIC METABOLIC PANEL
Creat: 1
Sodium: 140 mmol/L (ref 137–147)

## 2012-05-07 NOTE — Telephone Encounter (Signed)
Left another message for pt to call back to discuss her concerns. 

## 2012-06-03 ENCOUNTER — Ambulatory Visit (INDEPENDENT_AMBULATORY_CARE_PROVIDER_SITE_OTHER): Payer: Medicare Other | Admitting: Cardiology

## 2012-06-03 ENCOUNTER — Encounter: Payer: Self-pay | Admitting: Cardiology

## 2012-06-03 VITALS — BP 118/62 | HR 80 | Ht 65.0 in | Wt 148.6 lb

## 2012-06-03 DIAGNOSIS — I10 Essential (primary) hypertension: Secondary | ICD-10-CM

## 2012-06-03 DIAGNOSIS — I2510 Atherosclerotic heart disease of native coronary artery without angina pectoris: Secondary | ICD-10-CM

## 2012-06-03 DIAGNOSIS — R55 Syncope and collapse: Secondary | ICD-10-CM

## 2012-06-03 DIAGNOSIS — R609 Edema, unspecified: Secondary | ICD-10-CM

## 2012-06-03 LAB — BASIC METABOLIC PANEL
Calcium: 9.2 mg/dL (ref 8.4–10.5)
GFR: 53.29 mL/min — ABNORMAL LOW (ref >60.00)
Potassium: 3.1 mEq/L — ABNORMAL LOW (ref 3.5–5.1)
Sodium: 134 mEq/L — ABNORMAL LOW (ref 135–145)

## 2012-06-03 LAB — CBC
HCT: 42.9 % (ref 36.0–46.0)
MCV: 96.3 fl (ref 78.0–100.0)
RDW: 12.8 % (ref 11.5–14.6)
WBC: 8.6 10*3/uL (ref 4.5–10.5)

## 2012-06-03 MED ORDER — FUROSEMIDE 20 MG PO TABS: 20.0000 mg | ORAL_TABLET | ORAL | Status: DC | PRN

## 2012-06-03 MED ORDER — DIGOXIN 125 MCG PO TABS: 0.1250 mg | ORAL_TABLET | Freq: Every day | ORAL | Status: DC

## 2012-06-03 NOTE — Progress Notes (Signed)
HPI The patient presents for followup.  She was recently hospitalized at [facility] and I was able to retrieve his records and review them today. She presented with an upper respiratory infection in January. She was in atrial fibrillation with rapid rate. She did have a heart study with mildly reduced ejection fraction. She has remained in atrial fibrillation since admission.  Since discharge she has had no acute cardiovascular complaints. She denies any chest pressure, neck or arm discomfort. She's not necessarily noticing any palpitations, presyncope or syncope. She's had no weight gain or edema. She did apparently have some injury to her back in his sore with this. She says she's not falling and has not had any syncopal episodes recently.  Allergies  Allergen Reactions  . Ivp Dye (Iodinated Diagnostic Agents)   . Prednisone     Current Outpatient Prescriptions  Medication Sig Dispense Refill  . allopurinol (ZYLOPRIM) 100 MG tablet Take 100 mg by mouth daily.      Marland Kitchen ALPRAZolam (XANAX) 0.25 MG tablet Take 0.25 mg by mouth at bedtime as needed.        Marland Kitchen aspirin 81 MG tablet Take 81 mg by mouth daily.        Marland Kitchen co-enzyme Q-10 30 MG capsule Take 30 mg by mouth daily.       Marland Kitchen ibuprofen (ADVIL,MOTRIN) 200 MG tablet Take 200 mg by mouth every 6 (six) hours as needed.        . metoprolol (LOPRESSOR) 50 MG tablet Take 50 mg by mouth 3 (three) times daily.      . Multiple Vitamin (MULTIVITAMIN) tablet Take 1 tablet by mouth daily.        Marland Kitchen NITROSTAT 0.4 MG SL tablet DISSOLVE 1 UNDER TONGUE AS NEEDED FOR CHEST PAIN MAY REPEAT EVERY 5 MIN FOR A TOTAL OF 3 DOSES  25 tablet  11  . simvastatin (ZOCOR) 40 MG tablet Take 40 mg by mouth at bedtime.         No current facility-administered medications for this visit.    Past Medical History  Diagnosis Date  . Dyslipidemia   . Depression with anxiety   . Gout   . Hyperlipidemia   . Hypertension   . CAD (coronary artery disease)     95% LAD stenosis,  Cypher x 02 September 1996    Past Surgical History  Procedure Laterality Date  . Lumbar disc surgery    . Tonsillectomy    . Cardiac catheterization      ROS:  As stated in the HPI and negative for all other systems.  PHYSICAL EXAM BP 118/62  Pulse 80  Ht 5\' 5"  (1.651 m)  Wt 148 lb 9.6 oz (67.405 kg)  BMI 24.73 kg/m2 GENERAL:  Well appearing HEENT:  Pupils equal round and reactive, fundi not visualized, oral mucosa unremarkable NECK:  No jugular venous distention, waveform within normal limits, carotid upstroke brisk and symmetric, no bruits, no thyromegaly LYMPHATICS:  No cervical, inguinal adenopathy LUNGS:  Clear to auscultation bilaterally BACK:  No CVA tenderness CHEST:  Unremarkable HEART:  PMI not displaced or sustained,S1 and S2 within normal limits, no S3, no S4, no clicks, no rubs, no murmurs ABD:  Flat, positive bowel sounds normal in frequency in pitch, no bruits, no rebound, no guarding, no midline pulsatile mass, no hepatomegaly, no splenomegaly EXT:  2 plus pulses throughout, no edema, no cyanosis no clubbing SKIN:  No rashes no nodules NEURO:  Cranial nerves II through XII grossly intact,  motor grossly intact throughout PSYCH:  Cognitively intact, oriented to person place and time  EKG:  Atrial fibrillation, rate 80  nonspecific diffuse T wave flattening.  06/03/2012   ASSESSMENT AND PLAN  DIASTOLIC HF:  seems to be euvolemic.  At this point, no change in therapy is indicated.  We have reviewed salt and fluid restrictions.  No further cardiovascular testing is indicated. I will renew PRN lasix.   She'll let me know if she's having to use  CAD -  She had a negative stress perfusion study earlier this year. No change in therapy is indicated.  ESSENTIAL HYPERTENSION, BENIGN -  Her blood pressure is elevated but this will be treated in the context of managing her tachycardia.  ATRIAL FIBRILLATION - I have reviewed available records and the reason that she was not on  systemic anticoagulation was apparently fall risk. I think this is low. Ms. ARLENE BRICKEL has a CHA2DS2 - VASc score of 5 with a risk of stroke of 6.7%  and a HAS - BLED score of 2with a low risk of bleeds.  I will be checking labs to make sure there is no anemia and renal function is stable. If this is the case I will likely prescribe Eliquis. I will renew digoxin for rate control.

## 2012-06-03 NOTE — Patient Instructions (Addendum)
Please start Digoxin as ordered You may use Furosemide 20 mg as needed for swelling. You may be started on Eliquis as a blood thinner due to Atrial Fib Continue all other medications as listed.  Please have blood work as ordered  (BMP and CBC).  Follow up in 4 months with Dr Antoine Poche

## 2012-06-12 ENCOUNTER — Encounter: Payer: Self-pay | Admitting: Cardiology

## 2012-06-13 ENCOUNTER — Other Ambulatory Visit: Payer: Self-pay | Admitting: *Deleted

## 2012-06-13 DIAGNOSIS — E876 Hypokalemia: Secondary | ICD-10-CM

## 2012-06-17 ENCOUNTER — Other Ambulatory Visit: Payer: Self-pay | Admitting: *Deleted

## 2012-06-17 ENCOUNTER — Other Ambulatory Visit (INDEPENDENT_AMBULATORY_CARE_PROVIDER_SITE_OTHER): Payer: Medicare Other

## 2012-06-17 DIAGNOSIS — E876 Hypokalemia: Secondary | ICD-10-CM

## 2012-06-17 LAB — BASIC METABOLIC PANEL
Chloride: 95 mEq/L — ABNORMAL LOW (ref 96–112)
Potassium: 3.3 mEq/L — ABNORMAL LOW (ref 3.5–5.1)

## 2012-06-17 MED ORDER — POTASSIUM CHLORIDE CRYS ER 20 MEQ PO TBCR: EXTENDED_RELEASE_TABLET | ORAL | Status: DC

## 2012-06-24 ENCOUNTER — Other Ambulatory Visit (INDEPENDENT_AMBULATORY_CARE_PROVIDER_SITE_OTHER): Payer: Medicare Other

## 2012-06-24 DIAGNOSIS — E876 Hypokalemia: Secondary | ICD-10-CM

## 2012-06-24 LAB — BASIC METABOLIC PANEL
CO2: 31 mEq/L (ref 19–32)
Chloride: 94 mEq/L — ABNORMAL LOW (ref 96–112)
Glucose, Bld: 155 mg/dL — ABNORMAL HIGH (ref 70–99)
Potassium: 3.3 mEq/L — ABNORMAL LOW (ref 3.5–5.1)
Sodium: 134 mEq/L — ABNORMAL LOW (ref 135–145)

## 2012-06-28 ENCOUNTER — Telehealth: Payer: Self-pay | Admitting: Cardiology

## 2012-06-28 DIAGNOSIS — E876 Hypokalemia: Secondary | ICD-10-CM

## 2012-06-28 NOTE — Telephone Encounter (Signed)
New Problem:    Patient called in returning you call regarding her potassium regimine.  Please call back.

## 2012-06-28 NOTE — Telephone Encounter (Signed)
Pt aware to take potassium chl 40 MEQ today and then 20 MEQ every day there after.  She will repeat BMP in 1 month

## 2012-07-16 ENCOUNTER — Other Ambulatory Visit (INDEPENDENT_AMBULATORY_CARE_PROVIDER_SITE_OTHER): Payer: Medicare Other

## 2012-07-16 DIAGNOSIS — E876 Hypokalemia: Secondary | ICD-10-CM

## 2012-07-16 LAB — BASIC METABOLIC PANEL
BUN: 11 mg/dL (ref 6–23)
CO2: 29 mEq/L (ref 19–32)
Chloride: 97 mEq/L (ref 96–112)
Creatinine, Ser: 0.7 mg/dL (ref 0.4–1.2)
Glucose, Bld: 108 mg/dL — ABNORMAL HIGH (ref 70–99)

## 2012-07-17 ENCOUNTER — Telehealth: Payer: Self-pay | Admitting: *Deleted

## 2012-07-17 DIAGNOSIS — E876 Hypokalemia: Secondary | ICD-10-CM

## 2012-07-17 NOTE — Telephone Encounter (Signed)
Left message as she requested, lab appointment 07/22/12 at 2:00 pm  Patient wants to have labs done in [place].   Notes Recorded by Burnell Blanks on 07/16/2012 at 5:45 PM Labs reviewed by Dr Elease Hashimoto. Patient is taking her K+ and her Lasix both daily. Will have patient increase her K+ to 20 meq 2 daily and recheck 1 week. Advised patient

## 2012-07-22 ENCOUNTER — Other Ambulatory Visit: Payer: Medicare Other

## 2012-07-23 ENCOUNTER — Other Ambulatory Visit: Payer: Self-pay

## 2012-07-23 MED ORDER — POTASSIUM CHLORIDE CRYS ER 20 MEQ PO TBCR: 20.0000 meq | EXTENDED_RELEASE_TABLET | Freq: Two times a day (BID) | ORAL | Status: DC

## 2012-07-24 ENCOUNTER — Telehealth: Payer: Self-pay | Admitting: Cardiology

## 2012-07-24 ENCOUNTER — Ambulatory Visit (INDEPENDENT_AMBULATORY_CARE_PROVIDER_SITE_OTHER): Payer: Medicare Other

## 2012-07-24 DIAGNOSIS — E876 Hypokalemia: Secondary | ICD-10-CM

## 2012-07-25 LAB — BASIC METABOLIC PANEL
BUN/Creatinine Ratio: 14 (ref 11–26)
BUN: 12 mg/dL (ref 8–27)
Chloride: 99 mmol/L (ref 97–108)
GFR calc Af Amer: 73 mL/min/{1.73_m2} (ref >59)
Potassium: 4.9 mmol/L (ref 3.5–5.2)

## 2012-08-22 ENCOUNTER — Ambulatory Visit (INDEPENDENT_AMBULATORY_CARE_PROVIDER_SITE_OTHER): Payer: Medicare Other | Admitting: Cardiology

## 2012-08-22 ENCOUNTER — Encounter: Payer: Self-pay | Admitting: Cardiology

## 2012-08-22 VITALS — BP 112/82 | HR 81 | Ht 65.0 in | Wt 153.1 lb

## 2012-08-22 DIAGNOSIS — R002 Palpitations: Secondary | ICD-10-CM

## 2012-08-22 DIAGNOSIS — R0602 Shortness of breath: Secondary | ICD-10-CM

## 2012-08-22 DIAGNOSIS — R55 Syncope and collapse: Secondary | ICD-10-CM

## 2012-08-22 DIAGNOSIS — R609 Edema, unspecified: Secondary | ICD-10-CM

## 2012-08-22 DIAGNOSIS — I10 Essential (primary) hypertension: Secondary | ICD-10-CM

## 2012-08-22 DIAGNOSIS — I2510 Atherosclerotic heart disease of native coronary artery without angina pectoris: Secondary | ICD-10-CM

## 2012-08-22 MED ORDER — APIXABAN 5 MG PO TABS: 5.0000 mg | ORAL_TABLET | Freq: Two times a day (BID) | ORAL | Status: DC

## 2012-08-22 NOTE — Progress Notes (Signed)
HPI The patient presents for followup of palpitations.  She had atrial fibrillation documented last year at [facility]  She did have an echo with mildly reduced ejection fraction. She has remained in atrial fibrillation since that time.  Unfortunately she is going through a lot of stress at this time. Her brother is in the hospital dying. She continues to have palpitations particularly at night. She wakes up with her heart racing about 3 times per week. She feels anxious. She does not have any presyncope or syncope. She does not have any new shortness of breath though she will get short of breath with moderate activity. This has been chronic. She denies any new PND or orthopnea. She's not having any chest pressure, neck or arm discomfort.  She did have a nuclear stress test last year that did not suggest ischemia or infarct. This actually suggested that her ejection fraction was normal.  Allergies  Allergen Reactions  . Ivp Dye (Iodinated Diagnostic Agents)   . Prednisone     Current Outpatient Prescriptions  Medication Sig Dispense Refill  . ALPRAZolam (XANAX) 0.25 MG tablet Take 0.25 mg by mouth at bedtime as needed.        Marland Kitchen aspirin 81 MG tablet Take 81 mg by mouth daily.        Marland Kitchen co-enzyme Q-10 30 MG capsule Take 30 mg by mouth daily.       . digoxin (LANOXIN) 0.125 MG tablet Take 1 tablet (0.125 mg total) by mouth daily.  30 tablet  11  . furosemide (LASIX) 20 MG tablet Take 20 mg by mouth daily.      Marland Kitchen ibuprofen (ADVIL,MOTRIN) 200 MG tablet Take 200 mg by mouth every 6 (six) hours as needed.        . metoprolol (LOPRESSOR) 50 MG tablet Take 50 mg by mouth 3 (three) times daily.      . Multiple Vitamin (MULTIVITAMIN) tablet Take 1 tablet by mouth daily.        Marland Kitchen NITROSTAT 0.4 MG SL tablet DISSOLVE 1 UNDER TONGUE AS NEEDED FOR CHEST PAIN MAY REPEAT EVERY 5 MIN FOR A TOTAL OF 3 DOSES  25 tablet  11  . potassium chloride SA (K-DUR,KLOR-CON) 20 MEQ tablet Take 1 tablet (20 mEq total) by mouth  2 (two) times daily.  60 tablet  2  . simvastatin (ZOCOR) 40 MG tablet Take 40 mg by mouth at bedtime.         No current facility-administered medications for this visit.    Past Medical History  Diagnosis Date  . Dyslipidemia   . Depression with anxiety   . Gout   . Hyperlipidemia   . Hypertension   . CAD (coronary artery disease)     95% LAD stenosis, Cypher x 02 September 1996    Past Surgical History  Procedure Laterality Date  . Lumbar disc surgery    . Tonsillectomy    . Cardiac catheterization      ROS:  As stated in the HPI and negative for all other systems.  PHYSICAL EXAM BP 112/82  Pulse 81  Ht 5\' 5"  (1.651 m)  Wt 153 lb 1.9 oz (69.455 kg)  BMI 25.48 kg/m2 GENERAL:  Well appearing HEENT:  Pupils equal round and reactive, fundi not visualized, oral mucosa unremarkable NECK:  No jugular venous distention, waveform within normal limits, carotid upstroke brisk and symmetric, no bruits, no thyromegaly LYMPHATICS:  No cervical, inguinal adenopathy LUNGS:  Clear to auscultation bilaterally BACK:  No  CVA tenderness CHEST:  Unremarkable HEART:  PMI not displaced or sustained,S1 and S2 within normal limits, no S3, no clicks, no rubs, no murmurs, irregular ABD:  Flat, positive bowel sounds normal in frequency in pitch, no bruits, no rebound, no guarding, no midline pulsatile mass, no hepatomegaly, no splenomegaly EXT:  2 plus pulses throughout, trace edema, no cyanosis no clubbing SKIN:  No rashes no nodules NEURO:  Cranial nerves II through XII grossly intact, motor grossly intact throughout PSYCH:  Cognitively intact, oriented to person place and time, tearful  EKG:  Atrial fibrillation, rate 106 nonspecific diffuse T wave flattening.  08/22/2012   ASSESSMENT AND PLAN  DIASTOLIC HF:  She seems to be euvolemic.  At this point, no change in therapy is indicated.  We have reviewed salt and fluid restrictions.  No further cardiovascular testing is indicated.   CAD -  She  had a negative stress perfusion study last year. No change in therapy is indicated.  ESSENTIAL HYPERTENSION, BENIGN -  The blood pressure is at target. No change in medications is indicated. We will continue with therapeutic lifestyle changes (TLC).  ATRIAL FIBRILLATION - I have reviewed available records and the reason that she was not on systemic anticoagulation was apparently fall risk in the past. I think this is low. Ms. JANITH NIELSON has a CHA2DS2 - VASc score of 5 with a risk of stroke of 6.7%  and a HAS - BLED score of 2with a low risk of bleeds. I will prescribe Eliquis. I will continue digoxin and beta blocker for rate control.  She will stop her ASA.  I will followup with a Holter. Because her brother is actively dying I will wait until after this to reassess her rate control.

## 2012-08-22 NOTE — Patient Instructions (Addendum)
Please start Eliquis 5 mg twice a day. Restart Digoxin as ordered. Continue all other medications as listed.  Please have blood work today (BMP/BNP)  Your physician has recommended that you wear a holter monitor for 24 hours once things have settled down for you (please call). Holter monitors are medical devices that record the heart's electrical activity. Doctors most often use these monitors to diagnose arrhythmias. Arrhythmias are problems with the speed or rhythm of the heartbeat. The monitor is a small, portable device. You can wear one while you do your normal daily activities. This is usually used to diagnose what is causing palpitations/syncope (passing out).  Follow up with Dr Antoine Poche after wearing the monitor.

## 2012-08-23 LAB — BASIC METABOLIC PANEL
BUN: 17 mg/dL (ref 6–23)
GFR: 66.89 mL/min (ref >60.00)
Glucose, Bld: 83 mg/dL (ref 70–99)
Potassium: 4.4 mEq/L (ref 3.5–5.1)

## 2012-08-28 ENCOUNTER — Telehealth: Payer: Self-pay | Admitting: Cardiology

## 2012-08-28 DIAGNOSIS — I4891 Unspecified atrial fibrillation: Secondary | ICD-10-CM

## 2012-08-28 NOTE — Telephone Encounter (Signed)
Called patient back and she didn't have time to schedule monitor she was on her way to funeral. Ordered entered into the computer and will send message to [facility] to get monitor scheduled. Will forward to [facility] F RN so she will be aware.

## 2012-08-28 NOTE — Telephone Encounter (Signed)
New Prob     Calling wanting to schedule a holter monitor but no order in the system. Requesting nurse give her a call after 4 PM.

## 2012-08-29 ENCOUNTER — Other Ambulatory Visit: Payer: Self-pay | Admitting: *Deleted

## 2012-08-29 DIAGNOSIS — R609 Edema, unspecified: Secondary | ICD-10-CM

## 2012-08-29 DIAGNOSIS — R002 Palpitations: Secondary | ICD-10-CM

## 2012-08-30 ENCOUNTER — Ambulatory Visit (INDEPENDENT_AMBULATORY_CARE_PROVIDER_SITE_OTHER): Payer: Medicare Other

## 2012-08-30 DIAGNOSIS — I4891 Unspecified atrial fibrillation: Secondary | ICD-10-CM

## 2012-08-30 DIAGNOSIS — R609 Edema, unspecified: Secondary | ICD-10-CM

## 2012-08-30 DIAGNOSIS — R002 Palpitations: Secondary | ICD-10-CM

## 2012-08-30 NOTE — Addendum Note (Signed)
Addended by: Festus Aloe on: 08/30/2012 02:46 PM   Modules accepted: Orders

## 2012-08-31 LAB — BASIC METABOLIC PANEL
BUN/Creatinine Ratio: 12 (ref 11–26)
CO2: 19 mmol/L (ref 19–28)
Creatinine, Ser: 0.91 mg/dL (ref 0.57–1.00)
Sodium: 137 mmol/L (ref 134–144)

## 2012-09-05 ENCOUNTER — Telehealth: Payer: Self-pay | Admitting: Cardiology

## 2012-09-05 NOTE — Telephone Encounter (Signed)
New Problem  Pt has question regarding her monitor that was place on May 30.  She said that she will be not be home for at least 3 hours. She asked if you could give her a call back.

## 2012-09-05 NOTE — Telephone Encounter (Signed)
Notified patient regarding her monitor. The patient is looking for results of monitor that was dropped off on Monday. I told the patient the monitor was sent to the [place] office through our courier and that I will check with [place] to see if the monitor has been scanned yet. Tried to contact the [employer] office but I was told nobody is available to speak to me regarding this.

## 2012-09-06 NOTE — Telephone Encounter (Signed)
Please call patient with results of monitor 

## 2012-09-06 NOTE — Telephone Encounter (Signed)
Patient notified that someone will call back today either with a result if the [place] office has it scanned or not.

## 2012-09-06 NOTE — Telephone Encounter (Signed)
Pt aware Dr Antoine Poche has results and we will call back once he reviews them.  She states understanding.

## 2012-09-23 ENCOUNTER — Telehealth: Payer: Self-pay | Admitting: Cardiology

## 2012-09-23 NOTE — Telephone Encounter (Signed)
Pt states Eliquis is going to cost her $70 a month and she can't really afford it.  Wants to know if there is anything less expensive to take.  She reports a recent fall where she landed on her buttocks.  States she feels like she is sitting on a football when she sits down but she isn't in any pain.  She denies bruising at the site.   She also needs to know who Dr Antoine Poche would refer her to because her PCP is going to be the new President of [facility].  She is aware I will forward this information to Dr Antoine Poche for review and recommendations.

## 2012-09-23 NOTE — Telephone Encounter (Signed)
New Prob      Pt would like to speak to nurse regarding her medications. Please call.

## 2012-09-25 NOTE — Telephone Encounter (Signed)
Pt to be seen Friday by Dr Antoine Poche in the office

## 2012-09-27 ENCOUNTER — Encounter: Payer: Self-pay | Admitting: Cardiology

## 2012-09-27 ENCOUNTER — Ambulatory Visit (INDEPENDENT_AMBULATORY_CARE_PROVIDER_SITE_OTHER): Payer: Medicare Other | Admitting: Cardiology

## 2012-09-27 VITALS — BP 124/68 | HR 80 | Ht 65.0 in | Wt 150.4 lb

## 2012-09-27 DIAGNOSIS — E785 Hyperlipidemia, unspecified: Secondary | ICD-10-CM

## 2012-09-27 DIAGNOSIS — I2510 Atherosclerotic heart disease of native coronary artery without angina pectoris: Secondary | ICD-10-CM

## 2012-09-27 DIAGNOSIS — R55 Syncope and collapse: Secondary | ICD-10-CM

## 2012-09-27 DIAGNOSIS — I10 Essential (primary) hypertension: Secondary | ICD-10-CM

## 2012-09-27 DIAGNOSIS — Z7689 Persons encountering health services in other specified circumstances: Secondary | ICD-10-CM

## 2012-09-27 DIAGNOSIS — Z7189 Other specified counseling: Secondary | ICD-10-CM

## 2012-09-27 MED ORDER — DABIGATRAN ETEXILATE MESYLATE 150 MG PO CAPS: 150.0000 mg | ORAL_CAPSULE | Freq: Two times a day (BID) | ORAL | Status: DC

## 2012-09-27 NOTE — Progress Notes (Signed)
HPI The patient presents for followup of atrial fibrillation.  She has been on anticoagulation.  Holter demonstrated reasonable rate control.  She has had no new symptoms.  However, we added her to the schedule as she reported a fall and she felt like she was sitting on a "football" after this.  However, since that phone call she has had resolution of these symptoms.  She The patient denies any new symptoms such as chest discomfort, neck or arm discomfort. There has been no new shortness of breath, PND or orthopnea. There have been no reported palpitations, presyncope or syncope.   Allergies  Allergen Reactions  . Adhesive (Tape)     Rash  . Ivp Dye (Iodinated Diagnostic Agents)   . Prednisone     Current Outpatient Prescriptions  Medication Sig Dispense Refill  . ALPRAZolam (XANAX) 0.25 MG tablet Take 0.25 mg by mouth at bedtime as needed.        Marland Kitchen amitriptyline (ELAVIL) 10 MG tablet 10 mg at bedtime.       Marland Kitchen apixaban (ELIQUIS) 5 MG TABS tablet Take 1 tablet (5 mg total) by mouth 2 (two) times daily.  60 tablet  11  . co-enzyme Q-10 30 MG capsule Take 30 mg by mouth daily.       . digoxin (LANOXIN) 0.125 MG tablet Take 1 tablet (0.125 mg total) by mouth daily.  30 tablet  11  . furosemide (LASIX) 20 MG tablet Take 20 mg by mouth daily.      Marland Kitchen ibuprofen (ADVIL,MOTRIN) 200 MG tablet Take 200 mg by mouth every 6 (six) hours as needed.        . metoprolol (LOPRESSOR) 50 MG tablet Take 50 mg by mouth 3 (three) times daily.      . Multiple Vitamin (MULTIVITAMIN) tablet Take 1 tablet by mouth daily.        Marland Kitchen NITROSTAT 0.4 MG SL tablet DISSOLVE 1 UNDER TONGUE AS NEEDED FOR CHEST PAIN MAY REPEAT EVERY 5 MIN FOR A TOTAL OF 3 DOSES  25 tablet  11  . potassium chloride SA (K-DUR,KLOR-CON) 20 MEQ tablet Take 1 tablet (20 mEq total) by mouth 2 (two) times daily.  60 tablet  2  . simvastatin (ZOCOR) 40 MG tablet Take 40 mg by mouth at bedtime.        Marland Kitchen HYDROcodone-acetaminophen (NORCO) 10-325 MG per  tablet        No current facility-administered medications for this visit.    Past Medical History  Diagnosis Date  . Dyslipidemia   . Depression with anxiety   . Gout   . Hyperlipidemia   . Hypertension   . CAD (coronary artery disease)     95% LAD stenosis, Cypher x 02 September 1996    Past Surgical History  Procedure Laterality Date  . Lumbar disc surgery    . Tonsillectomy    . Cardiac catheterization      ROS:  As stated in the HPI and negative for all other systems.  PHYSICAL EXAM BP 124/68  Pulse 80  Ht 5\' 5"  (1.651 m)  Wt 150 lb 6.4 oz (68.221 kg)  BMI 25.03 kg/m2 GENERAL:  Well appearing NECK:  No jugular venous distention, waveform within normal limits, carotid upstroke brisk and symmetric, no bruits, no thyromegaly LUNGS:  Clear to auscultation bilaterally HEART:  PMI not displaced or sustained,S1 and S2 within normal limits, no S3, no clicks, no rubs, no murmurs, irregular ABD:  Flat, positive bowel sounds normal in  frequency in pitch, no bruits, no rebound, no guarding, no midline pulsatile mass, no hepatomegaly, no splenomegaly EXT:  2 plus pulses throughout, trace edema, no cyanosis no clubbing SKIN:  No rashes no nodules, there is no obvious mass on her buttocks where she fell. She has no point tenderness there either.   ASSESSMENT AND PLAN  DIASTOLIC HF:  She seems to be euvolemic.  At this point, no change in therapy is indicated.  We have reviewed salt and fluid restrictions.  No further cardiovascular testing is indicated.   CAD -  She had a negative stress perfusion study last year. No change in therapy is indicated.  ESSENTIAL HYPERTENSION, BENIGN -  The blood pressure is at target. No change in medications is indicated. We will continue with therapeutic lifestyle changes (TLC).  ATRIAL FIBRILLATION - Despite her recent fall I think the risk of anticoagulation is still lower than the benefit  [name] has a CHA2DS2 - VASc score of 5  with a risk of stroke of 6.7%  and a HAS - BLED score of 2 with a low risk of bleeds. However, the Eliquis was is causing her $70 a month. I will look to see if there is a cheaper alternative among the NOACs.

## 2012-09-27 NOTE — Patient Instructions (Addendum)
Please change from Eliquis to Pradaxa once you run out off Eliquis. The current medical regimen is effective;  continue present plan and medications.  Follow up in 4 months with Dr Antoine Poche.  You will receive a letter in the mail 2 months before you are due.  Please call us when you receive this letter to schedule your follow up appointment.

## 2012-10-08 ENCOUNTER — Ambulatory Visit: Payer: Medicare Other | Admitting: Adult Health

## 2012-10-22 ENCOUNTER — Other Ambulatory Visit: Payer: Self-pay

## 2012-10-22 MED ORDER — POTASSIUM CHLORIDE CRYS ER 20 MEQ PO TBCR: 20.0000 meq | EXTENDED_RELEASE_TABLET | Freq: Two times a day (BID) | ORAL | Status: DC

## 2012-10-28 ENCOUNTER — Telehealth: Payer: Self-pay | Admitting: Cardiology

## 2012-10-28 DIAGNOSIS — I10 Essential (primary) hypertension: Secondary | ICD-10-CM

## 2012-10-28 MED ORDER — LISINOPRIL 5 MG PO TABS: 5.0000 mg | ORAL_TABLET | Freq: Every day | ORAL | Status: DC

## 2012-10-28 NOTE — Telephone Encounter (Signed)
Dr. Swaziland advised patient take Lisinopril 5 mg QD and get a BMET in one week.  I informed patient of Dr. Elvis Coil orders.  Patient verbalized understanding and I verified patient's pharmacy.  Patient states she needs to come in for BMET on 8/6 instead of 8/4 due to another appointment.  I scheduled patient's lab appointment for 8/6 @ 1:00 pm and ordered BMET in EPIC.  Patient verbalized understanding and thanked me for my help.

## 2012-10-28 NOTE — Telephone Encounter (Signed)
Spoke with patient who reports elevated blood pressure over the last 2 weeks.  Patient wants to know if she can take additional medication.  Patient reports BP readings:  137/88; 139/90 HR 81; 142/98, HR 84; 133/108 HR 66; 147/85 HR 59 over last 2 weeks; today: 153/117, HR 94.  I advised patient that Dr. Antoine Poche is not in the office this week but that I will discuss these readings with Dr. [place], DOD and call her back.  Patient thanked me for my help.

## 2012-10-28 NOTE — Telephone Encounter (Signed)
New Prob  Pt states that her BP has been running high. She wants to know if she can take an extra BP pressure pill a day.

## 2012-10-28 NOTE — Telephone Encounter (Signed)
LMTCB

## 2012-11-06 ENCOUNTER — Other Ambulatory Visit (INDEPENDENT_AMBULATORY_CARE_PROVIDER_SITE_OTHER): Payer: Medicare Other

## 2012-11-06 DIAGNOSIS — I10 Essential (primary) hypertension: Secondary | ICD-10-CM

## 2012-11-06 LAB — BASIC METABOLIC PANEL
CO2: 29 mEq/L (ref 19–32)
Chloride: 98 mEq/L (ref 96–112)
Creatinine, Ser: 1.1 mg/dL (ref 0.4–1.2)
Glucose, Bld: 107 mg/dL — ABNORMAL HIGH (ref 70–99)

## 2012-11-27 ENCOUNTER — Encounter: Payer: Self-pay | Admitting: Family Medicine

## 2012-11-27 DIAGNOSIS — M8580 Other specified disorders of bone density and structure, unspecified site: Secondary | ICD-10-CM | POA: Insufficient documentation

## 2012-11-27 DIAGNOSIS — M545 Low back pain: Secondary | ICD-10-CM | POA: Insufficient documentation

## 2012-11-27 DIAGNOSIS — M109 Gout, unspecified: Secondary | ICD-10-CM | POA: Insufficient documentation

## 2012-11-27 DIAGNOSIS — R7303 Prediabetes: Secondary | ICD-10-CM | POA: Insufficient documentation

## 2012-11-27 DIAGNOSIS — I4891 Unspecified atrial fibrillation: Secondary | ICD-10-CM | POA: Insufficient documentation

## 2012-11-27 DIAGNOSIS — G629 Polyneuropathy, unspecified: Secondary | ICD-10-CM | POA: Insufficient documentation

## 2012-11-28 ENCOUNTER — Ambulatory Visit (INDEPENDENT_AMBULATORY_CARE_PROVIDER_SITE_OTHER): Payer: Medicare Other | Admitting: Family Medicine

## 2012-11-28 ENCOUNTER — Encounter: Payer: Self-pay | Admitting: Family Medicine

## 2012-11-28 VITALS — BP 124/82 | HR 68 | Temp 97.8°F | Ht 65.0 in | Wt 150.5 lb

## 2012-11-28 DIAGNOSIS — I10 Essential (primary) hypertension: Secondary | ICD-10-CM

## 2012-11-28 DIAGNOSIS — R739 Hyperglycemia, unspecified: Secondary | ICD-10-CM

## 2012-11-28 DIAGNOSIS — M810 Age-related osteoporosis without current pathological fracture: Secondary | ICD-10-CM

## 2012-11-28 DIAGNOSIS — M109 Gout, unspecified: Secondary | ICD-10-CM

## 2012-11-28 DIAGNOSIS — R7309 Other abnormal glucose: Secondary | ICD-10-CM

## 2012-11-28 NOTE — Patient Instructions (Addendum)
Try to avoid ibuprofen and aleve, tylenol is ok.  Make sure you stay hydrated. Return to see me in 2-3 months for follow up , prior fasting for blood work. Good to meet you today, call us with questions.

## 2012-11-28 NOTE — Assessment & Plan Note (Signed)
Will need to discuss treatment, latest DEXA.

## 2012-11-28 NOTE — Assessment & Plan Note (Signed)
Check A1c next visit. Pt denies h/o DM.

## 2012-11-28 NOTE — Assessment & Plan Note (Signed)
Stable.  Continue meds

## 2012-11-28 NOTE — Assessment & Plan Note (Signed)
Per pt, off allopurinol. Check UA next blood draw.

## 2012-11-28 NOTE — Progress Notes (Signed)
Subjective:    Patient ID: [name], female    DOB: Dec 30, 1932, 79 y.o.   MRN: [id number]  HPI CC: new pt to establish  Prior saw Dr. Randa Lynn.  Brother passed away 2022-08-17 of this year 2/2 heart disease.  DM - pt denies.  States had one high sugar. Gout - allopurinol stopped 3-4 months ago. Reports HTN, afib, CAD stable.  See ROS. Reviewed medical history.  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  Gets done at [place]. Per pt 01/2012 CPE Pneumovax 17-Aug-2007 Shingles shot - has not received Tetanus - not recently   Medications and allergies reviewed and updated in chart.  Past histories reviewed and updated if relevant as below. Patient Active Problem List   Diagnosis Date Noted  . Atrial fibrillation   . Type 2 diabetes mellitus   . Peripheral neuropathy   . Osteoporosis   . LBP (low back pain)   . Gout   . Syncope 09/15/2010  . Cramping of feet 07/19/2010  . Palpitations 07/19/2010  . EDEMA 01/19/2009  . HYPERLIPIDEMIA 07/02/2008  . ESSENTIAL HYPERTENSION, BENIGN 07/02/2008  . CAD 07/02/2008  . SKIN RASH 07/02/2008   Past Medical History  Diagnosis Date  . Dyslipidemia   . Depression with anxiety     celexa started 04/2010  . Hyperlipidemia   . Hypertension   . CAD (coronary artery disease)     95% LAD stenosis, Cypher x 02 September 1996  . Atrial fibrillation     history   . Type 2 diabetes mellitus 08-17-1999    pt denies  . Peripheral neuropathy   . Cervicalgia   . LBP (low back pain) 2005    spinal stenosis and bulging discs at L2-L5 s/p lumbar laminectomy Dutch Quint)  . Osteoporosis   . Gout     pt states allopurinol was stopped  . CHF (congestive heart failure) 2014    on recent hospitalization  . Arthritis   . Syncopal episodes     Past Surgical History  Procedure Laterality Date  . Lumbar laminectomy  08/17/03    Dr. Dutch Quint  . Tonsillectomy    . Cardiac catheterization    . Percutaneous coronary stent intervention (pci-s)      stent x3   History  Substance Use Topics  . Smoking status: Never Smoker   . Smokeless tobacco: Never Used  . Alcohol Use: Yes     Comment: Occasionally drinks beer   Family History  Problem Relation Age of Onset  . Heart disease Brother   . Heart disease Other     First degree relatives with heart disease but later onset  . Cancer Father     prostate   Allergies  Allergen Reactions  . Adhesive [Tape]     Rash  . Ivp Dye [Iodinated Diagnostic Agents]   . Magnesium Citrate Hives  . Prednisone   . Vytorin [Ezetimibe-Simvastatin] Other (See Comments)    leg cramps   Current Outpatient Prescriptions on File Prior to Visit  Medication Sig Dispense Refill  . ALPRAZolam (XANAX) 0.25 MG tablet Take 0.25 mg by mouth 3 (three) times daily as needed for sleep.       Marland Kitchen digoxin (LANOXIN) 0.125 MG tablet Take 1 tablet (0.125 mg total) by mouth  daily.  30 tablet  11  . furosemide (LASIX) 20 MG tablet Take 20 mg by mouth daily as needed.       Marland Kitchen HYDROcodone-acetaminophen (NORCO) 10-325 MG per tablet Take 1 tablet by mouth 2 (two) times daily as needed.       Marland Kitchen ibuprofen (ADVIL,MOTRIN) 200 MG tablet Take 600 mg by mouth every 8 (eight) hours as needed.       Marland Kitchen lisinopril (PRINIVIL,ZESTRIL) 5 MG tablet Take 1 tablet (5 mg total) by mouth daily.  30 tablet  3  . metoprolol (LOPRESSOR) 50 MG tablet Take 50 mg by mouth 3 (three) times daily.      . Multiple Vitamin (MULTIVITAMIN) tablet Take 1 tablet by mouth daily.        Marland Kitchen NITROSTAT 0.4 MG SL tablet DISSOLVE 1 UNDER TONGUE AS NEEDED FOR CHEST PAIN MAY REPEAT EVERY 5 MIN FOR A TOTAL OF 3 DOSES  25 tablet  11  . potassium chloride SA (K-DUR,KLOR-CON) 20 MEQ tablet Take 1 tablet (20 mEq total) by mouth 2 (two) times daily.  60 tablet  2  .  simvastatin (ZOCOR) 40 MG tablet Take 40 mg by mouth at bedtime.        Marland Kitchen co-enzyme Q-10 30 MG capsule Take 30 mg by mouth daily.        No current facility-administered medications on file prior to visit.     Review of Systems  Constitutional: Negative for fever, chills, activity change, appetite change, fatigue and unexpected weight change.  HENT: Negative for hearing loss and neck pain.   Eyes: Negative for visual disturbance.  Respiratory: Negative for cough, chest tightness, shortness of breath and wheezing.   Cardiovascular: Positive for leg swelling. Negative for chest pain and palpitations.  Gastrointestinal: Negative for nausea, vomiting, abdominal pain, diarrhea, constipation, blood in stool and abdominal distention.  Genitourinary: Negative for hematuria and difficulty urinating.  Musculoskeletal: Negative for myalgias and arthralgias.  Skin: Negative for rash.  Neurological: Negative for dizziness, seizures, syncope and headaches.  Hematological: Negative for adenopathy. Does not bruise/bleed easily.  Psychiatric/Behavioral: Negative for dysphoric mood. The patient is not nervous/anxious.        Objective:   Physical Exam  Nursing note and vitals reviewed. Constitutional: She is oriented to person, place, and time. She appears well-developed and well-nourished. No distress.  HENT:  Head: Normocephalic and atraumatic.  Right Ear: Hearing, tympanic membrane, external ear and ear canal normal.  Left Ear: Hearing, tympanic membrane, external ear and ear canal normal.  Nose: Nose normal.  Mouth/Throat: Uvula is midline, oropharynx is clear and moist and mucous membranes are normal. No oropharyngeal exudate, posterior oropharyngeal edema, posterior oropharyngeal erythema or tonsillar abscesses.  Eyes: Conjunctivae and EOM are normal. Pupils are equal, round, and reactive to light. No scleral icterus.  Neck: Normal range of motion. Neck supple. Carotid bruit is not present. No  thyromegaly present.  Cardiovascular: Normal rate, normal heart sounds and intact distal pulses.  An irregular rhythm present.  No murmur heard. Pulses:      Radial pulses are 2+ on the right side, and 2+ on the left side.  Pulmonary/Chest: Effort normal and breath sounds normal. No respiratory distress. She has no wheezes. She has no rales.  Musculoskeletal: Normal range of motion. She exhibits no edema.  Lymphadenopathy:    She has no cervical adenopathy.  Neurological: She is alert and oriented to person, place, and time.  CN grossly intact, station and gait intact  Skin:  Skin is warm and dry. Rash noted.  Excoriated rash on bilateral lower legs  Psychiatric: She has a normal mood and affect. Her behavior is normal. Judgment and thought content normal.       Assessment & Plan:

## 2012-12-04 ENCOUNTER — Encounter: Payer: Self-pay | Admitting: Family Medicine

## 2012-12-05 ENCOUNTER — Other Ambulatory Visit: Payer: Self-pay

## 2012-12-05 MED ORDER — NITROGLYCERIN 0.4 MG SL SUBL: SUBLINGUAL_TABLET | SUBLINGUAL | Status: DC

## 2012-12-07 ENCOUNTER — Encounter: Payer: Self-pay | Admitting: Family Medicine

## 2013-01-10 ENCOUNTER — Encounter: Payer: Self-pay | Admitting: Cardiology

## 2013-01-10 ENCOUNTER — Ambulatory Visit (INDEPENDENT_AMBULATORY_CARE_PROVIDER_SITE_OTHER): Payer: Medicare Other | Admitting: Cardiology

## 2013-01-10 VITALS — BP 142/80 | HR 80 | Ht 65.0 in | Wt 148.8 lb

## 2013-01-10 DIAGNOSIS — I10 Essential (primary) hypertension: Secondary | ICD-10-CM

## 2013-01-10 NOTE — Patient Instructions (Signed)
Your physician wants you to follow-up in: 6 months with Dr. Antoine Poche. You will receive a reminder letter in the mail two months in advance. If you don't receive a letter, please call our office to schedule the follow-up appointment.  Your physician recommends that you continue on your current medications as directed. Please refer to the Current Medication list given to you today.

## 2013-01-10 NOTE — Progress Notes (Signed)
HPI The patient presents for followup of atrial fibrillation.  She has been on anticoagulation.   She has had no new symptoms.   She denies any new symptoms such as chest discomfort, neck or arm discomfort. There has been no new shortness of breath, PND or orthopnea. There have been no reported palpitations, presyncope or syncope.  She chronically sleeps on 3 pillows. She's had none of the syncope she had previously.   Allergies  Allergen Reactions  . Adhesive [Tape]     Rash  . Ivp Dye [Iodinated Diagnostic Agents]   . Magnesium Citrate Hives  . Prednisone   . Vytorin [Ezetimibe-Simvastatin] Other (See Comments)    leg cramps    Current Outpatient Prescriptions  Medication Sig Dispense Refill  . ALPRAZolam (XANAX) 0.25 MG tablet Take 0.25 mg by mouth 3 (three) times daily as needed for sleep.       Marland Kitchen antiseptic oral rinse (BIOTENE) LIQD 15 mLs by Mouth Rinse route as needed.      Marland Kitchen apixaban (ELIQUIS) 5 MG TABS tablet Take 5 mg by mouth 2 (two) times daily.      Marland Kitchen co-enzyme Q-10 30 MG capsule Take 30 mg by mouth daily.       . digoxin (LANOXIN) 0.125 MG tablet Take 1 tablet (0.125 mg total) by mouth daily.  30 tablet  11  . furosemide (LASIX) 20 MG tablet Take 20 mg by mouth daily as needed.       Marland Kitchen HYDROcodone-acetaminophen (NORCO) 10-325 MG per tablet Take 1 tablet by mouth 2 (two) times daily as needed.       Marland Kitchen ibuprofen (ADVIL,MOTRIN) 200 MG tablet Take 600 mg by mouth every 8 (eight) hours as needed.       Marland Kitchen lisinopril (PRINIVIL,ZESTRIL) 5 MG tablet Take 1 tablet (5 mg total) by mouth daily.  30 tablet  3  . metoprolol (LOPRESSOR) 50 MG tablet Take 50 mg by mouth 3 (three) times daily.      . Multiple Vitamin (MULTIVITAMIN) tablet Take 1 tablet by mouth daily.        . nitroGLYCERIN (NITROSTAT) 0.4 MG SL tablet DISSOLVE 1 UNDER TONGUE AS NEEDED FOR CHEST PAIN MAY REPEAT EVERY 5 MIN FOR A TOTAL OF 3 DOSES  25 tablet  6  . Omega-3 Fatty Acids (FISH OIL) 1000 MG CAPS Take 1 capsule  by mouth 2 (two) times daily.      . potassium chloride SA (K-DUR,KLOR-CON) 20 MEQ tablet Take 1 tablet (20 mEq total) by mouth 2 (two) times daily.  60 tablet  2  . simvastatin (ZOCOR) 40 MG tablet Take 40 mg by mouth at bedtime.         No current facility-administered medications for this visit.    Past Medical History  Diagnosis Date  . Dyslipidemia   . Depression with anxiety     celexa started 04/2010  . Hyperlipidemia   . Hypertension   . CAD (coronary artery disease)     95% LAD stenosis, Cypher x 02 September 1996  . Atrial fibrillation     history   . Type 2 diabetes mellitus 2001    pt denies  . Peripheral neuropathy   . Cervicalgia   . LBP (low back pain) 2005    spinal stenosis and bulging discs at L2-L5 s/p lumbar laminectomy Dutch Quint)  . Osteoporosis   . Gout     pt states allopurinol was stopped  . CHF (congestive heart failure) 2014  on recent hospitalization  . Arthritis   . Syncopal episodes     Past Surgical History  Procedure Laterality Date  . Lumbar laminectomy  2005    Dr. Dutch Quint  . Tonsillectomy    . Cardiac catheterization    . Percutaneous coronary stent intervention (pci-s)      stent x3  . Cataract extraction Bilateral     ROS:  As stated in the HPI and negative for all other systems.  PHYSICAL EXAM BP 142/80  Pulse 80  Ht 5\' 5"  (1.651 m)  Wt 148 lb 12.8 oz (67.495 kg)  BMI 24.76 kg/m2 GENERAL:  Well appearing NECK:  No jugular venous distention, waveform within normal limits, carotid upstroke brisk and symmetric, no bruits, no thyromegaly LUNGS:  Clear to auscultation bilaterally HEART:  PMI not displaced or sustained,S1 and S2 within normal limits, no S3, no clicks, no rubs, no murmurs, irregular ABD:  Flat, positive bowel sounds normal in frequency in pitch, no bruits, no rebound, no guarding, no midline pulsatile mass, no hepatomegaly, no splenomegaly EXT:  2 plus pulses throughout, trace edema, no cyanosis no clubbing SKIN:  No  rashes no nodules, there is no obvious mass on her buttocks where she fell. She has no point tenderness there either.   ASSESSMENT AND PLAN  DIASTOLIC HF:  She seems to be euvolemic.  At this point, no change in therapy is indicated.  We have reviewed salt and fluid restrictions.  No further cardiovascular testing is indicated.   CAD -  She had a negative stress perfusion study last year. No change in therapy is indicated.  ESSENTIAL HYPERTENSION, BENIGN -  The blood pressure is at target. No change in medications is indicated. We will continue with therapeutic lifestyle changes (TLC).  ATRIAL FIBRILLATION -   Ms. ERUM CERCONE has a CHA2DS2 - VASc score of 5 with a risk of stroke of 6.7%  and a HAS - BLED score of 2 with a low risk of bleeds. Although it is expensive she wants to continue the Eliquis

## 2013-02-02 ENCOUNTER — Other Ambulatory Visit: Payer: Self-pay | Admitting: Family Medicine

## 2013-02-02 DIAGNOSIS — R739 Hyperglycemia, unspecified: Secondary | ICD-10-CM

## 2013-02-02 DIAGNOSIS — E785 Hyperlipidemia, unspecified: Secondary | ICD-10-CM

## 2013-02-02 DIAGNOSIS — M109 Gout, unspecified: Secondary | ICD-10-CM

## 2013-02-02 DIAGNOSIS — I10 Essential (primary) hypertension: Secondary | ICD-10-CM

## 2013-02-04 ENCOUNTER — Other Ambulatory Visit (INDEPENDENT_AMBULATORY_CARE_PROVIDER_SITE_OTHER): Payer: Medicare Other

## 2013-02-04 DIAGNOSIS — E785 Hyperlipidemia, unspecified: Secondary | ICD-10-CM

## 2013-02-04 DIAGNOSIS — R739 Hyperglycemia, unspecified: Secondary | ICD-10-CM

## 2013-02-04 DIAGNOSIS — I10 Essential (primary) hypertension: Secondary | ICD-10-CM

## 2013-02-04 DIAGNOSIS — R7309 Other abnormal glucose: Secondary | ICD-10-CM

## 2013-02-04 DIAGNOSIS — R609 Edema, unspecified: Secondary | ICD-10-CM

## 2013-02-04 DIAGNOSIS — M109 Gout, unspecified: Secondary | ICD-10-CM

## 2013-02-04 LAB — COMPREHENSIVE METABOLIC PANEL
ALT: 34 U/L (ref 0–35)
Albumin: 4.4 g/dL (ref 3.5–5.2)
BUN: 14 mg/dL (ref 6–23)
CO2: 29 mEq/L (ref 19–32)
Calcium: 9.4 mg/dL (ref 8.4–10.5)
Chloride: 99 mEq/L (ref 96–112)
GFR: 53.19 mL/min — ABNORMAL LOW (ref >60.00)
Glucose, Bld: 117 mg/dL — ABNORMAL HIGH (ref 70–99)
Potassium: 4.4 mEq/L (ref 3.5–5.1)
Sodium: 137 mEq/L (ref 135–145)
Total Protein: 7.1 g/dL (ref 6.0–8.3)

## 2013-02-04 LAB — LIPID PANEL
Cholesterol: 232 mg/dL — ABNORMAL HIGH (ref 0–200)
Total CHOL/HDL Ratio: 6
VLDL: 52.8 mg/dL — ABNORMAL HIGH (ref 0.0–40.0)

## 2013-02-04 LAB — URIC ACID: Uric Acid, Serum: 10.1 mg/dL — ABNORMAL HIGH (ref 2.4–7.0)

## 2013-02-11 ENCOUNTER — Encounter: Payer: Self-pay | Admitting: Family Medicine

## 2013-02-11 ENCOUNTER — Ambulatory Visit (INDEPENDENT_AMBULATORY_CARE_PROVIDER_SITE_OTHER): Payer: Medicare Other | Admitting: Family Medicine

## 2013-02-11 VITALS — BP 144/82 | HR 78 | Temp 98.0°F | Wt 148.8 lb

## 2013-02-11 DIAGNOSIS — R7309 Other abnormal glucose: Secondary | ICD-10-CM

## 2013-02-11 DIAGNOSIS — I10 Essential (primary) hypertension: Secondary | ICD-10-CM

## 2013-02-11 DIAGNOSIS — R7303 Prediabetes: Secondary | ICD-10-CM

## 2013-02-11 DIAGNOSIS — M109 Gout, unspecified: Secondary | ICD-10-CM

## 2013-02-11 DIAGNOSIS — Z23 Encounter for immunization: Secondary | ICD-10-CM

## 2013-02-11 DIAGNOSIS — E785 Hyperlipidemia, unspecified: Secondary | ICD-10-CM

## 2013-02-11 MED ORDER — ALLOPURINOL 100 MG PO TABS: 100.0000 mg | ORAL_TABLET | Freq: Every day | ORAL | Status: DC

## 2013-02-11 MED ORDER — ATORVASTATIN CALCIUM 40 MG PO TABS: 40.0000 mg | ORAL_TABLET | Freq: Every day | ORAL | Status: DC

## 2013-02-11 NOTE — Patient Instructions (Signed)
Flu and pneumonia shots today. Call your insurance [facility]) about the shingles shot to see if it is covered or how much it would cost and where is cheaper (here or pharmacy).  If you want to receive here, call for nurse visit. Start allopurinol - at 100mg  daily - this will help decrease risk of future gout flare. Stop simvastatin 40mg , start atorvastatin 40mg  - for better cholesterol control. Good to see you today, call us with questions.

## 2013-02-11 NOTE — Assessment & Plan Note (Signed)
Goal for her LDL <100, ideally >70 given CAD hx. Simvastatin 40mg  not reaching goal - so will change to lipitor 40mg  daily.

## 2013-02-11 NOTE — Assessment & Plan Note (Signed)
UA recent check 10.1. Pt also endorses intermittent mild flares of gout recently. Start allopurinol 100mg  daily

## 2013-02-11 NOTE — Progress Notes (Signed)
  Subjective:    Patient ID: [name], female    DOB: 12/02/1932, 79 y.o.   MRN: [id number]  HPI CC:2 mo f/u  Sees Dr. Vear Clock for pain meds - under pain contract with them.  Gout - prior pcp stopped allopurinol (per patient).  However she has noticed increasing gouty attacks over last few months.  HTN - bp elevated today - pt compliant with meds but hasn't taken all her meds yet.  No HA, vision changes, CP/tightness, SOB, leg swelling.   HLD - simvastatin may be causing myalgias.  Osteoporosis - unsure last dexa scan.  Past Medical History  Diagnosis Date  . Dyslipidemia   . Depression with anxiety     celexa started 04/2010  . Hyperlipidemia   . Hypertension   . CAD (coronary artery disease)     95% LAD stenosis, Cypher x 02 September 1996  . Atrial fibrillation     history   . Type 2 diabetes mellitus 2001    pt denies  . Peripheral neuropathy   . Cervicalgia   . LBP (low back pain) 2005    spinal stenosis and bulging discs at L2-L5 s/p lumbar laminectomy Dutch Quint)  . Osteoporosis   . Gout     prior PCP stopped allopurinol  . CHF (congestive heart failure) 2014    on recent hospitalization  . Arthritis   . Syncopal episodes     Review of Systems Per HPI    Objective:   Physical Exam  Nursing note and vitals reviewed. Constitutional: She appears well-developed and well-nourished. No distress.  HENT:  Mouth/Throat: Oropharynx is clear and moist. No oropharyngeal exudate.  Eyes: Conjunctivae are normal. Pupils are equal, round, and reactive to light. No scleral icterus.  Neck: Normal range of motion. Neck supple.  Cardiovascular: Normal rate, regular rhythm, normal heart sounds and intact distal pulses.   No murmur heard. Pulmonary/Chest: Effort normal and breath sounds normal. No respiratory distress. She has no wheezes. She has no rales.  Musculoskeletal: She exhibits no edema.       Assessment & Plan:

## 2013-02-11 NOTE — Assessment & Plan Note (Signed)
Reviewed A1c, encouraged avoiding added sugars/sweets (solid and liquid form)

## 2013-02-11 NOTE — Addendum Note (Signed)
Addended by: Josph Macho A on: 02/11/2013 02:44 PM   Modules accepted: Orders

## 2013-02-11 NOTE — Progress Notes (Signed)
Pre-visit discussion using our clinic review tool. No additional management support is needed unless otherwise documented below in the visit note.  

## 2013-02-11 NOTE — Assessment & Plan Note (Signed)
Chronic, stable. Continue meds.  On lisinopril and metoprolol 50mg  tid.

## 2013-02-13 ENCOUNTER — Telehealth: Payer: Self-pay | Admitting: Cardiology

## 2013-02-13 NOTE — Telephone Encounter (Signed)
Left message for pt I will call her back

## 2013-02-13 NOTE — Telephone Encounter (Signed)
New Problem:  Pt states she had labs done at [facility] and they told her that her liver function was elevated. Pt wants to speak to [facility] about that and her eloquis medication.. Pt wants to know if that medication could be causing her liver function to be elevated... Please advise

## 2013-02-14 NOTE — Telephone Encounter (Signed)
Spoke with pt who is concerned because she saw a commercial about people being on Eliquis and having problems.  Advised no side effects r/t liver functions that I am aware of.  Also advised liver function has been slightly elevated in the past and it will continue to be monitored.  She stated understanding and thanked me for my time.

## 2013-02-20 ENCOUNTER — Other Ambulatory Visit: Payer: Self-pay

## 2013-02-20 MED ORDER — LISINOPRIL 5 MG PO TABS: 5.0000 mg | ORAL_TABLET | Freq: Every day | ORAL | Status: DC

## 2013-03-19 ENCOUNTER — Other Ambulatory Visit: Payer: Self-pay | Admitting: *Deleted

## 2013-03-19 MED ORDER — METOPROLOL TARTRATE 50 MG PO TABS: 50.0000 mg | ORAL_TABLET | Freq: Three times a day (TID) | ORAL | Status: DC

## 2013-03-25 ENCOUNTER — Telehealth: Payer: Self-pay

## 2013-03-25 MED ORDER — IBUPROFEN 600 MG PO TABS: 600.0000 mg | ORAL_TABLET | Freq: Three times a day (TID) | ORAL | Status: DC | PRN

## 2013-03-25 NOTE — Telephone Encounter (Signed)
plz notify this was sent in but; 1. We did not receive request - please have pt ensure she has her pharmacy sending [place] the requests and not her prior PCP's office (Dr. Randa Lynn) 2. Pt needs to minimize use of ibuprofen as it can interact with eliquis and increase bleeding risk. 3. Also need to be careful with kidneys on this medication so she needs to ensure she drinks plenty of water when taking ibuprofen.

## 2013-03-25 NOTE — Telephone Encounter (Signed)
Pt said that her pharmacy requested ibuprofen this morning and she cannot believe med has not been refilled at 4:00 pm. Advised pt I did not see request in computer and asked pt if she would hold while I called the pharmacy. Pt said she wanted the medication filled today and hung up phone; spoke with Robin at [facility] pharmacy and he said pt gets Ibuprofen 600 mg tab taking one tab by mouth q8h prn. On med list ibuprofen 200 mg tablet is listed. Please advise.

## 2013-03-26 NOTE — Telephone Encounter (Signed)
Message left advising patient.  

## 2013-04-03 DIAGNOSIS — S22080A Wedge compression fracture of T11-T12 vertebra, initial encounter for closed fracture: Secondary | ICD-10-CM

## 2013-04-03 DIAGNOSIS — R55 Syncope and collapse: Secondary | ICD-10-CM

## 2013-04-03 HISTORY — DX: Wedge compression fracture of T11-T12 vertebra, initial encounter for closed fracture: S22.080A

## 2013-04-03 HISTORY — DX: Syncope and collapse: R55

## 2013-04-18 ENCOUNTER — Other Ambulatory Visit: Payer: Self-pay | Admitting: *Deleted

## 2013-04-18 MED ORDER — IBUPROFEN 600 MG PO TABS: 600.0000 mg | ORAL_TABLET | Freq: Three times a day (TID) | ORAL | Status: DC | PRN

## 2013-04-18 NOTE — Telephone Encounter (Signed)
Faxed refill request. Please advise.  

## 2013-04-21 ENCOUNTER — Other Ambulatory Visit: Payer: Self-pay | Admitting: *Deleted

## 2013-04-21 NOTE — Telephone Encounter (Signed)
Received faxed refill request from pharmacy. Last office visit 02/11/13 and last refill 02/07/13 #90. Is it okay to refill medication?

## 2013-04-22 ENCOUNTER — Telehealth: Payer: Self-pay | Admitting: Cardiology

## 2013-04-22 MED ORDER — ALPRAZOLAM 0.25 MG PO TABS: 0.2500 mg | ORAL_TABLET | Freq: Three times a day (TID) | ORAL | Status: DC | PRN

## 2013-04-22 NOTE — Telephone Encounter (Signed)
Rx called to pharmacy as instructed. 

## 2013-04-22 NOTE — Telephone Encounter (Signed)
New message   Insurance will not longer cover eliquis . Please advise on alternative.

## 2013-04-22 NOTE — Telephone Encounter (Signed)
plz phone in. 

## 2013-04-23 NOTE — Telephone Encounter (Signed)
Have sent to Dr Antoine PocheHochrein for review and recommendations  I will call the pt as soon as I get new orders.

## 2013-04-23 NOTE — Telephone Encounter (Signed)
Will forward to MD for review and orders 

## 2013-04-23 NOTE — Telephone Encounter (Signed)
Follow up     Pt want to switch from eliquis to something else because she cannot afford it.  What else can she take?

## 2013-04-25 ENCOUNTER — Telehealth: Payer: Self-pay | Admitting: Family Medicine

## 2013-04-25 MED ORDER — WARFARIN SODIUM 5 MG PO TABS: 5.0000 mg | ORAL_TABLET | Freq: Every day | ORAL | Status: DC

## 2013-04-25 NOTE — Telephone Encounter (Signed)
Spoke with Dr. Jenene SlickerHochrein's nurse, Pam.  She said that Dr. Antoine PocheHochrein did not feel comfortable sending RX for Coumadin since pt wanted to be managed here at [facility].  Dr. Sharen HonesGutierrez agreed to start pt on Coumadin.  Pt instructed that RX was sent to [facility].  Appt scheduled to check INR on Monday.  Verbalized understanding.

## 2013-04-25 NOTE — Telephone Encounter (Signed)
Pt called upset because she has been trying to get in touch with her Cardiologist, Dr. Antoine PocheHochrein (see telephone note from 04/22/13) for 3 days and no one has called her back.  She said that she cannot afford to keep taking Eloquis and that Dr. Antoine PocheHochrein told her that her PCP needed to write an RX for Coumadin.  His nurse told her she would have to have blood work first.  She has been off of Eloquis x 3 days.  She also said if she has a stroke she will "sue everyone" and go to [facility].  She said she is losing faith in [facility].

## 2013-04-25 NOTE — Telephone Encounter (Signed)
Pt aware she needs to be started on Coumadin.  She does not want to come [facility] [facility].  Requested she call her PCP to see if they will start her and follow up so she won't have to come to [facility].  Pt will call them and call back if any further questions or concerns.  Pt states she has not taken her Eliquis in 2 days because she can not afford it.  Advised she has to be on an anticoagulant and needs to start it today.  She states understanding.

## 2013-04-25 NOTE — Telephone Encounter (Addendum)
Can we set her up with coumadin clinic today to get started on coumadin?  Off eliquis for last 3 days and unfortunately we don't have any samples. Would probably start her at 5mg  daily. Wt Readings from Last 3 Encounters:  02/11/13 148 lb 12 oz (67.473 kg)  01/10/13 148 lb 12.8 oz (67.495 kg)  11/28/12 150 lb 8 oz (68.266 kg)

## 2013-04-25 NOTE — Telephone Encounter (Signed)
We can get her in with the Coumadin clinic to start warfarin.

## 2013-04-28 ENCOUNTER — Telehealth: Payer: Self-pay | Admitting: Family Medicine

## 2013-04-28 ENCOUNTER — Encounter: Payer: Self-pay | Admitting: Family Medicine

## 2013-04-28 ENCOUNTER — Telehealth: Payer: Self-pay | Admitting: Cardiology

## 2013-04-28 ENCOUNTER — Ambulatory Visit: Payer: Medicare Other

## 2013-04-28 NOTE — Telephone Encounter (Signed)
New message    Dr Sharen H[facility]Gutierrez at [facility] want this pt to see Dr Antoine PocheHochrein only---no PA or NP as soon as he returns.  Pt refuses to take coumadin or any of her medications.  She want to wait until April. Can you work her in when Dr Antoine PocheHochrein returns?

## 2013-04-28 NOTE — Telephone Encounter (Signed)
Called Dr Lindaann SloughHochreins office and spoke with Lupita Leashonna in scheduling. Dr Antoine PocheHochrein is out of the office this week so Lupita LeashDonna was going to send a message to Dr Lindaann SloughHochreins nurse to see where they can put her on the schedule for next week when he returns to the office.

## 2013-04-28 NOTE — Telephone Encounter (Signed)
Left message for pt to call back.  Scheduled her to see Dr Antoine PocheHochrein on 2/12 at 2:45 pm to further discuss medications and her care.

## 2013-04-28 NOTE — Telephone Encounter (Signed)
Pt called and said that she read up on Coumadin this weekend and decided that it's "too dangerous" for her to take and that she was returning the unopened RX she picked up from the pharmacy.  I explained to pt that she needed to be on some form of anticoagulation because of her A-fib and that it was dangerous for her NOT to be on a medication.  She continued to argue with me and said she would follow up with Dr. Antoine PocheHochrein at her appt in April.  I told her I would let Dr. Sharen HonesGutierrez and Dr. Antoine PocheHochrein know.

## 2013-04-28 NOTE — Telephone Encounter (Signed)
She needs to follow up with cards sooner than April.  Can we schedule pt appt with Dr. Antoine PocheHochrein at next available? Will route to [place] to schedule.

## 2013-04-29 NOTE — Telephone Encounter (Signed)
Spoke with pt who states she will not take Coumdin because that is part of what killed her brother.  She reports having more At Fib but remains off any anticoagulation.  Advised pt she is at a great risk for stroke with being in At Fib and not on anticoagulation.  She is coming to [facility] tomorrow for an appointment with another MD.  She has new insurance which she says will not cover Eliquis and will bring her card so that we can copy it and contact her insurance company to see what they will cover.  She will speak with Rose who has a card for the pt to take to the pharmacy for a free 30 day supply until we can figure out what the insurance will cover.  She was scheduled to see Dr Antoine PocheHochrein 2/12 for her increase in At Fib s/s.

## 2013-04-29 NOTE — Telephone Encounter (Signed)
Follow up with me or APP to discuss her issues with anticoagulation.

## 2013-04-29 NOTE — Telephone Encounter (Signed)
Please see other telephone note from 1/26.  Pt coming tomorrow to pick up samples, 30 day free supply card and information to contact the company about assistance with co-pay.  She will also leave a copy of her insurance card so that they can be contacted about there formulary.  She is scheduled to see Dr Antoine PocheHochrein back 2/12.

## 2013-05-01 ENCOUNTER — Telehealth: Payer: Self-pay

## 2013-05-01 NOTE — Telephone Encounter (Addendum)
Please have her come in to discuss falls. I will review PA for xanax when it arrives.

## 2013-05-01 NOTE — Telephone Encounter (Signed)
Appt scheduled

## 2013-05-01 NOTE — Telephone Encounter (Signed)
PA for eliquis for [employer] done, the request for PA for xanax will be faxed to Dr Eustaquio BoydenJavier Gutierrez, MD PCP per Dr Lindaann SloughHochreins request

## 2013-05-01 NOTE — Telephone Encounter (Signed)
Ms [name] came to the office on 04/30/13 to bring some papers about her insurance and she wanted you to know that she fell 3 times on 04/29/13 she really did not tell my why or how.Thanks Okey Dupreose

## 2013-05-02 ENCOUNTER — Encounter: Payer: Self-pay | Admitting: Family Medicine

## 2013-05-02 ENCOUNTER — Ambulatory Visit (INDEPENDENT_AMBULATORY_CARE_PROVIDER_SITE_OTHER): Payer: Medicare Other | Admitting: Family Medicine

## 2013-05-02 VITALS — BP 140/74 | HR 68 | Temp 98.2°F | Wt 145.8 lb

## 2013-05-02 DIAGNOSIS — R55 Syncope and collapse: Secondary | ICD-10-CM

## 2013-05-02 LAB — CBC WITH DIFFERENTIAL/PLATELET
BASOS ABS: 0.1 10*3/uL (ref 0.0–0.1)
Basophils Relative: 1 % (ref 0–1)
Eosinophils Absolute: 1.2 10*3/uL — ABNORMAL HIGH (ref 0.0–0.7)
Eosinophils Relative: 13 % — ABNORMAL HIGH (ref 0–5)
HCT: 40.6 % (ref 36.0–46.0)
Hemoglobin: 14.3 g/dL (ref 12.0–15.0)
Lymphocytes Relative: 19 % (ref 12–46)
Lymphs Abs: 1.7 10*3/uL (ref 0.7–4.0)
MCH: 34.4 pg — ABNORMAL HIGH (ref 26.0–34.0)
MCHC: 35.2 g/dL (ref 30.0–36.0)
MCV: 97.6 fL (ref 78.0–100.0)
MONO ABS: 0.9 10*3/uL (ref 0.1–1.0)
MONOS PCT: 9 % (ref 3–12)
Neutro Abs: 5.3 10*3/uL (ref 1.7–7.7)
Neutrophils Relative %: 58 % (ref 43–77)
Platelets: 252 10*3/uL (ref 150–400)
RBC: 4.16 MIL/uL (ref 3.87–5.11)
RDW: 13.6 % (ref 11.5–15.5)
WBC: 9.2 10*3/uL (ref 4.0–10.5)

## 2013-05-02 MED ORDER — METOPROLOL TARTRATE 50 MG PO TABS: 50.0000 mg | ORAL_TABLET | Freq: Two times a day (BID) | ORAL | Status: DC

## 2013-05-02 NOTE — Progress Notes (Signed)
Pre-visit discussion using our clinic review tool. No additional management support is needed unless otherwise documented below in the visit note.  

## 2013-05-02 NOTE — Patient Instructions (Signed)
Your standing blood pressure today did drop compared to when you were sitting down. I'd like to decrease metoprolol to 50mg  twice daily instead of three times daily.  If you notice heart racing or heart rate persistently >90-100 then may increase back to three times daily. Blood work today - we will call you with results. If any one sided weakness or numbness of body or face, or any slurring of speech or worsening dizziness please seek urgent care.
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
## 2013-05-03 ENCOUNTER — Encounter: Payer: Self-pay | Admitting: Family Medicine

## 2013-05-03 LAB — DIGOXIN LEVEL: Digoxin Level: 1.2 ng/mL (ref 0.8–2.0)

## 2013-05-03 LAB — TSH: TSH: 5.251 u[IU]/mL — ABNORMAL HIGH (ref 0.350–4.500)

## 2013-05-03 NOTE — Assessment & Plan Note (Addendum)
Recent syncopal episodes may have been orthostatic hypotension related as she is orthostatic today in office.  I have asked her to decrease metoprolol to 50mg  twice daily and have advised she may return to TID dosing if her heart rate persistently >90-100 or her blood pressure rise significantly. I will also draw a CBC, TSH and digoxin level today to evaluate other causes of syncope. She has upcoming appointment with Dr. Antoine PocheHochrein in 2 weeks to discuss anticoagulant choice. Doubt CVA or other cause of syncope today. EKG today - atrial ibrillation, normal axis, diffuse ST flattening/depression, overall unchanged from prior EKG 2014

## 2013-05-05 ENCOUNTER — Other Ambulatory Visit: Payer: Self-pay | Admitting: Family Medicine

## 2013-05-05 ENCOUNTER — Other Ambulatory Visit (INDEPENDENT_AMBULATORY_CARE_PROVIDER_SITE_OTHER): Payer: Medicare Other

## 2013-05-05 DIAGNOSIS — R946 Abnormal results of thyroid function studies: Secondary | ICD-10-CM

## 2013-05-05 DIAGNOSIS — R7989 Other specified abnormal findings of blood chemistry: Secondary | ICD-10-CM

## 2013-05-06 LAB — T3: T3, Total: 76.3 ng/dL — ABNORMAL LOW (ref 80.0–204.0)

## 2013-05-06 LAB — T4, FREE: Free T4: 0.84 ng/dL (ref 0.60–1.60)

## 2013-05-07 ENCOUNTER — Other Ambulatory Visit: Payer: Self-pay | Admitting: Family Medicine

## 2013-05-07 MED ORDER — LEVOTHYROXINE SODIUM 25 MCG PO TABS: 25.0000 ug | ORAL_TABLET | ORAL | Status: DC

## 2013-05-08 ENCOUNTER — Other Ambulatory Visit: Payer: Self-pay | Admitting: *Deleted

## 2013-05-08 ENCOUNTER — Telehealth: Payer: Self-pay | Admitting: *Deleted

## 2013-05-08 NOTE — Telephone Encounter (Signed)
[employer] approved Tier exception request for eliquis.

## 2013-05-15 ENCOUNTER — Encounter: Payer: Self-pay | Admitting: Cardiology

## 2013-05-15 ENCOUNTER — Ambulatory Visit (INDEPENDENT_AMBULATORY_CARE_PROVIDER_SITE_OTHER): Payer: Medicare Other | Admitting: Cardiology

## 2013-05-15 VITALS — BP 132/82 | HR 88 | Ht 65.0 in | Wt 148.0 lb

## 2013-05-15 DIAGNOSIS — R55 Syncope and collapse: Secondary | ICD-10-CM

## 2013-05-15 DIAGNOSIS — I10 Essential (primary) hypertension: Secondary | ICD-10-CM

## 2013-05-15 DIAGNOSIS — I2510 Atherosclerotic heart disease of native coronary artery without angina pectoris: Secondary | ICD-10-CM

## 2013-05-15 DIAGNOSIS — I4891 Unspecified atrial fibrillation: Secondary | ICD-10-CM

## 2013-05-15 MED ORDER — TORSEMIDE 10 MG PO TABS: 10.0000 mg | ORAL_TABLET | Freq: Two times a day (BID) | ORAL | Status: DC

## 2013-05-15 MED ORDER — APIXABAN 5 MG PO TABS: 5.0000 mg | ORAL_TABLET | Freq: Two times a day (BID) | ORAL | Status: DC

## 2013-05-15 NOTE — Patient Instructions (Signed)
Please continue on Eliquis. Once you finish your Furosemide start Torsemide 10 mg as needed. Continue all other medications as listed  Follow up in 3 months with Dr Antoine PocheHochrein.

## 2013-05-15 NOTE — Progress Notes (Signed)
HPI The patient presents for followup of atrial fibrillation.  She has been on anticoagulation.   Since last saw her she has called us having problems affording her warfarin. She also has noted that her heart has been out of rhythm on her monitor although she is not really feeling as. She might have occasional palpitations. She did have 2 episodes of syncope. These happened suddenly. She said she had some bruising on her right chest following one of these episodes. However, she saw Eustaquio BoydenJavier Gutierrez, MD and he reduced her beta blocker. She seems to be doing much better with this. She did have one episode of chest pain after getting very stressed about a person she had staying at a rental property.  She had to take a NTG.  However, this was unusual. She is otherwise able to do her activities of daily living without bringing on any chest pain, neck or arm discomfort. She's not having any new shortness of breath, PND or orthopnea.   Allergies  Allergen Reactions  . Adhesive [Tape]     Rash  . Ivp Dye [Iodinated Diagnostic Agents]   . Magnesium Citrate Hives  . Prednisone   . Vytorin [Ezetimibe-Simvastatin] Other (See Comments)    leg cramps    Current Outpatient Prescriptions  Medication Sig Dispense Refill  . allopurinol (ZYLOPRIM) 100 MG tablet Take 1 tablet (100 mg total) by mouth daily.  30 tablet  11  . ALPRAZolam (XANAX) 0.25 MG tablet Take 1 tablet (0.25 mg total) by mouth 3 (three) times daily as needed for anxiety or sleep.  90 tablet  0  . antiseptic oral rinse (BIOTENE) LIQD 15 mLs by Mouth Rinse route as needed.      Marland Kitchen. apixaban (ELIQUIS) 5 MG TABS tablet Take 5 mg by mouth 2 (two) times daily.      Marland Kitchen. atorvastatin (LIPITOR) 40 MG tablet Take 1 tablet (40 mg total) by mouth daily.  30 tablet  3  . co-enzyme Q-10 30 MG capsule Take 30 mg by mouth daily.       . digoxin (LANOXIN) 0.125 MG tablet Take 1 tablet (0.125 mg total) by mouth daily.  30 tablet  11  . furosemide (LASIX) 20 MG  tablet Take 20 mg by mouth daily as needed.       Marland Kitchen. HYDROcodone-acetaminophen (NORCO) 10-325 MG per tablet Take 1 tablet by mouth 2 (two) times daily as needed.       Marland Kitchen. ibuprofen (ADVIL,MOTRIN) 600 MG tablet Take 1 tablet (600 mg total) by mouth every 8 (eight) hours as needed.  30 tablet  0  . levothyroxine (SYNTHROID, LEVOTHROID) 25 MCG tablet Take 1 tablet (25 mcg total) by mouth every Monday, Wednesday, and Friday.  30 tablet  3  . lisinopril (PRINIVIL,ZESTRIL) 5 MG tablet Take 1 tablet (5 mg total) by mouth daily.  30 tablet  6  . metoprolol (LOPRESSOR) 50 MG tablet Take 1 tablet (50 mg total) by mouth 2 (two) times daily.  60 tablet  6  . Multiple Vitamin (MULTIVITAMIN) tablet Take 1 tablet by mouth daily.        . nitroGLYCERIN (NITROSTAT) 0.4 MG SL tablet DISSOLVE 1 UNDER TONGUE AS NEEDED FOR CHEST PAIN MAY REPEAT EVERY 5 MIN FOR A TOTAL OF 3 DOSES  25 tablet  6  . Omega-3 Fatty Acids (FISH OIL) 1000 MG CAPS Take 1 capsule by mouth 2 (two) times daily.      . potassium chloride SA (K-DUR,KLOR-CON) 20 MEQ  tablet Take 1 tablet (20 mEq total) by mouth 2 (two) times daily.  60 tablet  2  . warfarin (COUMADIN) 5 MG tablet Take 1 tablet (5 mg total) by mouth daily.  30 tablet  2   No current facility-administered medications for this visit.    Past Medical History  Diagnosis Date  . Dyslipidemia   . Depression with anxiety     celexa started 04/2010  . Hyperlipidemia   . Hypertension   . CAD (coronary artery disease)     95% LAD stenosis, Cypher x 02 September 1996  . Atrial fibrillation     history   . Type 2 diabetes mellitus 2001    pt denies  . Peripheral neuropathy   . Cervicalgia   . LBP (low back pain) 2005    spinal stenosis and bulging discs at L2-L5 s/p lumbar laminectomy Dutch Quint)  . Osteoporosis   . Gout     prior PCP stopped allopurinol  . CHF (congestive heart failure) 2014    on recent hospitalization  . Arthritis   . Syncopal episodes     Past Surgical History    Procedure Laterality Date  . Lumbar laminectomy  2005    Dr. Dutch Quint  . Tonsillectomy    . Cardiac catheterization    . Percutaneous coronary stent intervention (pci-s)      stent x3  . Cataract extraction Bilateral     ROS:  As stated in the HPI and negative for all other systems.  PHYSICAL EXAM BP 132/82  Pulse 88  Ht 5\' 5"  (1.651 m)  Wt 148 lb (67.132 kg)  BMI 24.63 kg/m2 GENERAL:  Well appearing NECK:  No jugular venous distention, waveform within normal limits, carotid upstroke brisk and symmetric, no bruits, no thyromegaly LUNGS:  Clear to auscultation bilaterally HEART:  PMI not displaced or sustained,S1 and S2 within normal limits, no S3, no clicks, no rubs, no murmurs, irregular ABD:  Flat, positive bowel sounds normal in frequency in pitch, no bruits, no rebound, no guarding, no midline pulsatile mass, no hepatomegaly, no splenomegaly EXT:  2 plus pulses throughout, trace edema, no cyanosis no clubbing SKIN:  No rashes no nodules, there is no obvious mass on her buttocks where she fell. She has no point tenderness there either.   ASSESSMENT AND PLAN  DIASTOLIC HF:  She seems to be euvolemic.  At this point, no change in therapy is indicated.  We have reviewed salt and fluid restrictions.  No further cardiovascular testing is indicated.   CAD -  She had a negative stress perfusion study last year.  If she has further pain I will consider repeat testing.   ESSENTIAL HYPERTENSION, BENIGN -  The blood pressure is at target. No change in medications is indicated. We will continue with therapeutic lifestyle changes (TLC).  ATRIAL FIBRILLATION -   [name] has a CHA2DS2 - VASc score of 5 with a risk of stroke of 6.7%  and a HAS - BLED score of 2 with a low risk of bleeds.   She had a problem affording Eliquis.  However, I think that we have helped her with this.    SYNCOPE - This likely was related to low blood pressures. She's not had any episodes since her  beta blocker was reduced.  No further workup is planned.

## 2013-05-30 ENCOUNTER — Other Ambulatory Visit: Payer: Self-pay | Admitting: Family Medicine

## 2013-05-31 ENCOUNTER — Emergency Department: Payer: Self-pay | Admitting: Emergency Medicine

## 2013-05-31 LAB — COMPREHENSIVE METABOLIC PANEL
ALT: 55 U/L (ref 12–78)
Albumin: 4.3 g/dL (ref 3.4–5.0)
Alkaline Phosphatase: 100 U/L
Anion Gap: 6 — ABNORMAL LOW (ref 7–16)
BUN: 14 mg/dL (ref 7–18)
Bilirubin,Total: 0.8 mg/dL (ref 0.2–1.0)
CO2: 31 mmol/L (ref 21–32)
Calcium, Total: 9.4 mg/dL (ref 8.5–10.1)
Chloride: 93 mmol/L — ABNORMAL LOW (ref 98–107)
Creatinine: 0.94 mg/dL (ref 0.60–1.30)
EGFR (Non-African Amer.): 58 — ABNORMAL LOW
Glucose: 141 mg/dL — ABNORMAL HIGH (ref 65–99)
OSMOLALITY: 264 (ref 275–301)
Potassium: 3.5 mmol/L (ref 3.5–5.1)
SGOT(AST): 84 U/L — ABNORMAL HIGH (ref 15–37)
Sodium: 130 mmol/L — ABNORMAL LOW (ref 136–145)
TOTAL PROTEIN: 7.8 g/dL (ref 6.4–8.2)

## 2013-05-31 LAB — CBC
HCT: 39.2 % (ref 35.0–47.0)
HGB: 13.3 g/dL (ref 12.0–16.0)
MCH: 34.3 pg — AB (ref 26.0–34.0)
MCHC: 33.9 g/dL (ref 32.0–36.0)
MCV: 101 fL — AB (ref 80–100)
Platelet: 261 10*3/uL (ref 150–440)
RBC: 3.87 10*6/uL (ref 3.80–5.20)
RDW: 13 % (ref 11.5–14.5)
WBC: 10.6 10*3/uL (ref 3.6–11.0)

## 2013-05-31 LAB — LIPASE, BLOOD: LIPASE: 138 U/L (ref 73–393)

## 2013-05-31 LAB — TROPONIN I: Troponin-I: <0.02 ng/mL

## 2013-06-01 LAB — URINALYSIS, COMPLETE
BILIRUBIN, UR: NEGATIVE
BLOOD: NEGATIVE
Bacteria: NONE SEEN
GLUCOSE, UR: NEGATIVE mg/dL (ref 0–75)
Ketone: NEGATIVE
Leukocyte Esterase: NEGATIVE
Nitrite: NEGATIVE
PH: 5 (ref 4.5–8.0)
RBC,UR: 1 /HPF (ref 0–5)
Specific Gravity: 1.012 (ref 1.003–1.030)
Squamous Epithelial: <1

## 2013-06-02 NOTE — Telephone Encounter (Signed)
Ok to refill in Dr. G's absence? 

## 2013-06-02 NOTE — Telephone Encounter (Signed)
Please call in.  Thanks.   

## 2013-06-03 ENCOUNTER — Telehealth: Payer: Self-pay | Admitting: Family Medicine

## 2013-06-03 NOTE — Telephone Encounter (Signed)
Rx called in as directed.   

## 2013-06-03 NOTE — Telephone Encounter (Signed)
Pt called today wanting to know if after her lab appt tomorrow she could have a few minutes to talk with Dr. Reece AgarG. I explained to her that with it being the start of a new day and we start seeing patients at 8 am it might be difficult. She wanted to speak with a nurse now and at that time the clinical meeting was going on. I was going to transfer her to Triage but she did not want to be transferred. She mentioned that she has been in the hospital over the weekend and didn't know if we were going to be able to get blood from her due to her bruising from previous blood work at the hospital. Pt request c/b today. Please advise

## 2013-06-03 NOTE — Telephone Encounter (Signed)
Called patient and female advised she wasn't there. He advised she would have to call me back. I advised that we were closed for the day and that I would try to speak with her in the morning.

## 2013-06-04 ENCOUNTER — Telehealth: Payer: Self-pay | Admitting: Family Medicine

## 2013-06-04 ENCOUNTER — Other Ambulatory Visit (INDEPENDENT_AMBULATORY_CARE_PROVIDER_SITE_OTHER): Payer: Medicare Other

## 2013-06-04 ENCOUNTER — Other Ambulatory Visit: Payer: Self-pay | Admitting: Family Medicine

## 2013-06-04 DIAGNOSIS — I10 Essential (primary) hypertension: Secondary | ICD-10-CM

## 2013-06-04 DIAGNOSIS — E785 Hyperlipidemia, unspecified: Secondary | ICD-10-CM

## 2013-06-04 DIAGNOSIS — E039 Hypothyroidism, unspecified: Secondary | ICD-10-CM | POA: Insufficient documentation

## 2013-06-04 DIAGNOSIS — M109 Gout, unspecified: Secondary | ICD-10-CM

## 2013-06-04 NOTE — Telephone Encounter (Signed)
Relevant patient education mailed to patient.  

## 2013-06-04 NOTE — Telephone Encounter (Signed)
Patient came in and spoke with Adrienne-site manager this AM.

## 2013-06-06 ENCOUNTER — Other Ambulatory Visit: Payer: Medicare Other

## 2013-06-06 LAB — COMPREHENSIVE METABOLIC PANEL
ALK PHOS: 86 U/L (ref 39–117)
ALT: 34 U/L (ref 0–35)
AST: 43 U/L — ABNORMAL HIGH (ref 0–37)
Albumin: 4 g/dL (ref 3.5–5.2)
BILIRUBIN TOTAL: 0.8 mg/dL (ref 0.3–1.2)
BUN: 25 mg/dL — ABNORMAL HIGH (ref 6–23)
CO2: 27 mEq/L (ref 19–32)
CREATININE: 1.2 mg/dL (ref 0.4–1.2)
Calcium: 9.5 mg/dL (ref 8.4–10.5)
Chloride: 108 mEq/L (ref 96–112)
GFR: 47.9 mL/min — ABNORMAL LOW (ref >60.00)
GLUCOSE: 103 mg/dL — AB (ref 70–99)
Potassium: 4.6 mEq/L (ref 3.5–5.1)
SODIUM: 144 meq/L (ref 135–145)
TOTAL PROTEIN: 6.8 g/dL (ref 6.0–8.3)

## 2013-06-06 LAB — LIPID PANEL
CHOLESTEROL: 152 mg/dL (ref 0–200)
HDL: 35.9 mg/dL — ABNORMAL LOW (ref >39.00)
LDL CALC: 92 mg/dL (ref 0–99)
Total CHOL/HDL Ratio: 4
Triglycerides: 122 mg/dL (ref 0.0–149.0)
VLDL: 24.4 mg/dL (ref 0.0–40.0)

## 2013-06-06 LAB — TSH: TSH: 0.95 u[IU]/mL (ref 0.35–5.50)

## 2013-06-06 LAB — URIC ACID: Uric Acid, Serum: 8 mg/dL — ABNORMAL HIGH (ref 2.4–7.0)

## 2013-06-11 ENCOUNTER — Encounter: Payer: Self-pay | Admitting: Family Medicine

## 2013-06-11 ENCOUNTER — Ambulatory Visit (INDEPENDENT_AMBULATORY_CARE_PROVIDER_SITE_OTHER): Payer: Medicare Other | Admitting: Family Medicine

## 2013-06-11 VITALS — BP 128/74 | HR 68 | Temp 97.5°F | Ht 65.0 in | Wt 143.8 lb

## 2013-06-11 DIAGNOSIS — R55 Syncope and collapse: Secondary | ICD-10-CM

## 2013-06-11 DIAGNOSIS — I4891 Unspecified atrial fibrillation: Secondary | ICD-10-CM

## 2013-06-11 DIAGNOSIS — Z0000 Encounter for general adult medical examination without abnormal findings: Secondary | ICD-10-CM

## 2013-06-11 DIAGNOSIS — F418 Other specified anxiety disorders: Secondary | ICD-10-CM

## 2013-06-11 DIAGNOSIS — I10 Essential (primary) hypertension: Secondary | ICD-10-CM

## 2013-06-11 DIAGNOSIS — N63 Unspecified lump in unspecified breast: Secondary | ICD-10-CM

## 2013-06-11 DIAGNOSIS — E039 Hypothyroidism, unspecified: Secondary | ICD-10-CM

## 2013-06-11 DIAGNOSIS — N6310 Unspecified lump in the right breast, unspecified quadrant: Secondary | ICD-10-CM

## 2013-06-11 DIAGNOSIS — F341 Dysthymic disorder: Secondary | ICD-10-CM

## 2013-06-11 DIAGNOSIS — M810 Age-related osteoporosis without current pathological fracture: Secondary | ICD-10-CM

## 2013-06-11 DIAGNOSIS — M109 Gout, unspecified: Secondary | ICD-10-CM

## 2013-06-11 DIAGNOSIS — E785 Hyperlipidemia, unspecified: Secondary | ICD-10-CM

## 2013-06-11 MED ORDER — SERTRALINE HCL 25 MG PO TABS: 25.0000 mg | ORAL_TABLET | Freq: Every day | ORAL | Status: DC

## 2013-06-11 NOTE — Assessment & Plan Note (Signed)
Now stable on current regimen - continue.

## 2013-06-11 NOTE — Assessment & Plan Note (Signed)
I have today requested latest dexa from [facility].

## 2013-06-11 NOTE — Patient Instructions (Addendum)
[name] to [facility]. [name] to request records from last bone density scan [facility]. Start sertraline 25mg  daily for anxiety.  Let's back off alprazolam if able. Call your insurance about the shingles shot to see if it is covered or how much it would cost and where is cheaper (here or pharmacy).  If you want to receive here, call for nurse visit. We will schedule a detailed mammogram [facility]. Bring all your medications to your next appointment as I'm not sure what medicines you are currently taking.

## 2013-06-11 NOTE — Assessment & Plan Note (Addendum)
+  screen today.  Describes more of a mild depression with anxiety attacks. Having trouble receiving insurance approval for alprazolam. Pt agrees to retrial of SSRI - will start sertraline 25mg  daily. Reassess next visit.

## 2013-06-11 NOTE — Assessment & Plan Note (Signed)
Irregular rate controlled today.  Continue eliquis.

## 2013-06-11 NOTE — Assessment & Plan Note (Signed)
attributed to orthostatic hypotension, seems resolved after med changes

## 2013-06-11 NOTE — Assessment & Plan Note (Signed)
Continue allopurinol.  Urate 101 -> 8.0.  No recent gout flares. No results found for this basename: URICACID

## 2013-06-11 NOTE — Assessment & Plan Note (Addendum)
I have personally reviewed the Medicare Annual Wellness questionnaire and have noted 1. The patient's medical and social history 2. Their use of alcohol, tobacco or illicit drugs 3. Their current medications and supplements 4. The patient's functional ability including ADL's, fall risks, home safety risks and hearing or visual impairment. 5. Diet and physical activity 6. Evidence for depression or mood disorders The patients weight, height, BMI have been recorded in the chart.  Hearing and vision has been addressed. I have made referrals, counseling and provided education to the patient based review of the above and I have provided the pt with a written personalized care plan for preventive services. See scanned questionairre. Advanced directives discussed: pt would want son to be HCPOA.  Does not desire prolonged life support.  Reviewed preventative protocols and updated unless pt declined. Declines audiology eval today

## 2013-06-11 NOTE — Progress Notes (Addendum)
BP 128/74  Pulse 68  Temp(Src) 97.5 F (36.4 C) (Oral)  Ht 5' 5"  (1.651 m)  Wt 143 lb 12.8 oz (65.227 kg)  BMI 23.93 kg/m2   CC: medicare wellness   Subjective:    Patient ID: [name], female    DOB: 07-Apr-1932, 80 y.o.   MRN: [id number]  HPI: ROSALINDA SEAMAN is a 80 y.o. female presenting on 06/11/2013 with Annual Exam   Med rec - pt should be on lipitor 17m daily and torsemide 1432mbid, but looking at pharmacy dispensation reconciliation she most recently filled hctz and simvastatin.  She thinks she may have been taking both lipitor and simvastatin.  She does not bring meds with her today and is unclear what meds she's on.  Takes alprazolam 0.32m70mt night time for anxiety attacks.  She was on celexa in past, but doesn't know how she did with this.  States she does get frequent panic attacks.  [place] with [place] after [place].  States EKG done, IVF given, treated with zofran.  She didn't have good experience at ER due to long wait time.  Taking eliquis 32mg50md.  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  No syncope since last seen here, no falls since then (med change improved sxs).  Occasional dizziness. PHQ9 = 8.  Preventative: Colon cancer screening - unsure when maybe 2009, told not to worry about rpt  Cervical cancer screening - has aged out Mammogram - 02/2012.  Due for f/u Flu shot done this year Pneumovax 02/2013 Shingles shot - discussed Tetanus - not recently DEXA - pt states has been getting yearly - [facility] [place] not had [facility] density scan done there Advanced directives: discussed, doesn't want prolonged life support.  "I  want to die natural death".  Would want son to be HCPOA despite his mental health issues.   Relevant past medical, surgical, family and social history reviewed and updated as indicated.  Allergies and medications reviewed and updated. Current Outpatient Prescriptions on File Prior to Visit  Medication Sig  . allopurinol (ZYLOPRIM) 100 MG tablet Take 1 tablet (100 mg total) by mouth daily.  . ALMarland KitchenRAZolam (XANAX) 0.25 MG tablet TAKE ONE TABLET BY MOUTH 3 TIMES DAILY AS NEEDED  . antiseptic oral rinse (BIOTENE) LIQD 15 mLs by Mouth Rinse route as needed.  . apMarland Kitchenxaban (ELIQUIS) 5 MG TABS tablet Take 1 tablet (5 mg total) by mouth 2 (two) times daily.  . atMarland Kitchenrvastatin (LIPITOR) 40 MG tablet Take 1 tablet (40 mg total) by mouth daily.  . coMarland Kitchenenzyme Q-10 30 MG capsule Take 30 mg by mouth daily.   . digoxin (LANOXIN) 0.125 MG tablet Take 1 tablet (0.125 mg total) by mouth daily.  . HYMarland KitchenROcodone-acetaminophen (NORCO) 10-325 MG per tablet Take 1 tablet by mouth 2 (two) times daily as needed.   . ibMarland Kitchenprofen (ADVIL,MOTRIN) 600 MG tablet Take 1 tablet (600 mg total) by mouth every 8 (eight) hours as needed.  . leMarland Kitchenothyroxine (SYNTHROID, LEVOTHROID) 25 MCG tablet Take 1 tablet (25 mcg total) by mouth every Monday, Wednesday, and Friday.  . liMarland Kitcheninopril (PRINIVIL,ZESTRIL) 5 MG tablet Take 1 tablet (5 mg total)  by mouth daily.  . metoprolol (LOPRESSOR) 50 MG tablet Take 1 tablet (50 mg total) by mouth 2 (two) times daily.  . Multiple Vitamin (MULTIVITAMIN) tablet Take 1 tablet by mouth daily.    . nitroGLYCERIN (NITROSTAT) 0.4 MG SL tablet DISSOLVE 1 UNDER TONGUE AS NEEDED FOR CHEST PAIN MAY REPEAT EVERY 5 MIN FOR A TOTAL OF 3 DOSES  . Omega-3 Fatty Acids (FISH OIL) 1000 MG CAPS Take 1 capsule by mouth 2 (two) times daily.  . potassium chloride SA (K-DUR,KLOR-CON) 20 MEQ tablet Take 1 tablet (20 mEq total) by mouth 2 (two) times daily.  Marland Kitchen torsemide (DEMADEX) 10 MG tablet Take 1 tablet (10 mg total) by mouth 2 (two)  times daily.   No current facility-administered medications on file prior to visit.    Review of Systems Per HPI unless specifically indicated above    Objective:    BP 128/74  Pulse 68  Temp(Src) 97.5 F (36.4 C) (Oral)  Ht 5' 5"  (1.651 m)  Wt 143 lb 12.8 oz (65.227 kg)  BMI 23.93 kg/m2  Physical Exam  Nursing note and vitals reviewed. Constitutional: She is oriented to person, place, and time. She appears well-developed and well-nourished. No distress.  HENT:  Head: Normocephalic and atraumatic.  Right Ear: Hearing, tympanic membrane, external ear and ear canal normal.  Left Ear: Hearing, tympanic membrane, external ear and ear canal normal.  Nose: Nose normal.  Mouth/Throat: Uvula is midline, oropharynx is clear and moist and mucous membranes are normal. No oropharyngeal exudate, posterior oropharyngeal edema or posterior oropharyngeal erythema.  Eyes: Conjunctivae and EOM are normal. Pupils are equal, round, and reactive to light. No scleral icterus.  Neck: Normal range of motion. Neck supple. Carotid bruit is not present. No thyromegaly present.  Cardiovascular: Normal rate, regular rhythm, normal heart sounds and intact distal pulses.   No murmur heard. Pulses:      Radial pulses are 2+ on the right side, and 2+ on the left side.  Pulmonary/Chest: Effort normal and breath sounds normal. No respiratory distress. She has no wheezes. She has no rales. Right breast exhibits mass (3 o clock thickening). Right breast exhibits no inverted nipple, no nipple discharge, no skin change and no tenderness. Left breast exhibits no inverted nipple, no mass, no nipple discharge, no skin change and no tenderness. Breasts are symmetrical.  Abdominal: Soft. Bowel sounds are normal. She exhibits no distension and no mass. There is no tenderness. There is no rebound and no guarding.  Musculoskeletal: Normal range of motion. She exhibits no edema.  Lymphadenopathy:    She has no cervical  adenopathy.  Neurological: She is alert and oriented to person, place, and time.  CN grossly intact, station and gait intact 3/3 recall 5/5 calculation serial 3s  Skin: Skin is warm and dry. No rash noted.  Psychiatric: She has a normal mood and affect. Her behavior is normal. Judgment and thought content normal.   Results for orders placed in visit on 06/04/13  LIPID PANEL      Result Value Ref Range   Cholesterol 152  0 - 200 mg/dL   Triglycerides 122.0  0.0 - 149.0 mg/dL   HDL 35.90 (*) >39.00 mg/dL   VLDL 24.4  0.0 - 40.0 mg/dL   LDL Cholesterol 92  0 - 99 mg/dL   Total CHOL/HDL Ratio 4    TSH      Result Value Ref Range   TSH 0.95  0.35 - 5.50 uIU/mL  COMPREHENSIVE METABOLIC  PANEL      Result Value Ref Range   Sodium 144  135 - 145 mEq/L   Potassium 4.6  3.5 - 5.1 mEq/L   Chloride 108  96 - 112 mEq/L   CO2 27  19 - 32 mEq/L   Glucose, Bld 103 (*) 70 - 99 mg/dL   BUN 25 (*) 6 - 23 mg/dL   Creatinine, Ser 1.2  0.4 - 1.2 mg/dL   Total Bilirubin 0.8  0.3 - 1.2 mg/dL   Alkaline Phosphatase 86  39 - 117 U/L   AST 43 (*) 0 - 37 U/L   ALT 34  0 - 35 U/L   Total Protein 6.8  6.0 - 8.3 g/dL   Albumin 4.0  3.5 - 5.2 g/dL   Calcium 9.5  8.4 - 10.5 mg/dL   GFR 47.90 (*) >60.00 mL/min  URIC ACID      Result Value Ref Range   Uric Acid, Serum 8.0 (*) 2.4 - 7.0 mg/dL      Assessment & Plan:   Problem List Items Addressed This Visit   Atrial fibrillation     Irregular rate controlled today.  Continue eliquis.    Depression with anxiety     +screen today.  Describes more of a mild depression with anxiety attacks. Having trouble receiving insurance approval for alprazolam. Pt agrees to retrial of SSRI - will start sertraline 67m daily. Reassess next visit.    Essential hypertension, benign     Chronic, stable. Continue med.    Gout      Continue allopurinol.  Urate 101 -> 8.0.  No recent gout flares. No results found for this basename: URICACID      HYPERLIPIDEMIA      Chronic, stable. Continue med.  Pt unsure if taking simvastatin, atorvastatin, or both. I've asked her to only take atorvastatin and bring all meds to next appointment. Simvastatin was replaced with atorva 2/2 LDL goal not met (02/2013).    Hypothyroidism     Now stable on current regimen - continue.    Medicare annual wellness visit, initial - Primary     I have personally reviewed the Medicare Annual Wellness questionnaire and have noted 1. The patient's medical and social history 2. Their use of alcohol, tobacco or illicit drugs 3. Their current medications and supplements 4. The patient's functional ability including ADL's, fall risks, home safety risks and hearing or visual impairment. 5. Diet and physical activity 6. Evidence for depression or mood disorders The patients weight, height, BMI have been recorded in the chart.  Hearing and vision has been addressed. I have made referrals, counseling and provided education to the patient based review of the above and I have provided the pt with a written personalized care plan for preventive services. See scanned questionairre. Advanced directives discussed: pt would want son to be HCPOA.  Does not desire prolonged life support.  Reviewed preventative protocols and updated unless pt declined. Declines audiology eval today    Osteoporosis     I have today requested latest dexa from [facility].    RESOLVED: Syncope     attributed to orthostatic hypotension, seems resolved after med changes     Other Visit Diagnoses   Lump of right breast        Relevant Orders       MM Digital Diagnostic Bilat        Follow up plan: Return in about 3 months (around 09/11/2013), or as needed, for follow up.

## 2013-06-11 NOTE — Assessment & Plan Note (Signed)
Chronic, stable. Continue med.  Pt unsure if taking simvastatin, atorvastatin, or both. I've asked her to only take atorvastatin and bring all meds to next appointment. Simvastatin was replaced with atorva 2/2 LDL goal not met (02/2013). 

## 2013-06-11 NOTE — Assessment & Plan Note (Signed)
Chronic, stable. Continue med. 

## 2013-06-13 ENCOUNTER — Telehealth: Payer: Self-pay | Admitting: Family Medicine

## 2013-06-13 NOTE — Addendum Note (Signed)
Addended by: Eustaquio BoydenGUTIERREZ, Talissa Apple on: 06/13/2013 05:47 PM   Modules accepted: Orders

## 2013-06-13 NOTE — Telephone Encounter (Signed)
plz notify - pt [facility]. Therefore I [facility] when next gets mammogram.

## 2013-06-18 NOTE — Telephone Encounter (Signed)
Attempted to call patient. No answer, no machine. Will try again later.  

## 2013-06-19 NOTE — Telephone Encounter (Signed)
Message left notifying patient.

## 2013-06-27 ENCOUNTER — Ambulatory Visit: Payer: Self-pay | Admitting: Family Medicine

## 2013-06-27 ENCOUNTER — Other Ambulatory Visit: Payer: Self-pay | Admitting: Family Medicine

## 2013-06-27 DIAGNOSIS — N6310 Unspecified lump in the right breast, unspecified quadrant: Secondary | ICD-10-CM

## 2013-06-27 LAB — HM MAMMOGRAPHY: HM MAMMO: NORMAL

## 2013-06-30 ENCOUNTER — Encounter: Payer: Self-pay | Admitting: Family Medicine

## 2013-06-30 ENCOUNTER — Other Ambulatory Visit: Payer: Self-pay | Admitting: Cardiology

## 2013-07-01 ENCOUNTER — Encounter: Payer: Self-pay | Admitting: Family Medicine

## 2013-07-01 ENCOUNTER — Ambulatory Visit: Payer: Self-pay | Admitting: Family Medicine

## 2013-07-01 ENCOUNTER — Encounter: Payer: Self-pay | Admitting: *Deleted

## 2013-07-02 DIAGNOSIS — M8580 Other specified disorders of bone density and structure, unspecified site: Secondary | ICD-10-CM

## 2013-07-02 HISTORY — PX: OTHER SURGICAL HISTORY: SHX169

## 2013-07-02 HISTORY — DX: Other specified disorders of bone density and structure, unspecified site: M85.80

## 2013-07-05 ENCOUNTER — Encounter: Payer: Self-pay | Admitting: Family Medicine

## 2013-07-05 ENCOUNTER — Other Ambulatory Visit: Payer: Self-pay | Admitting: Family Medicine

## 2013-07-05 MED ORDER — VITAMIN D 50 MCG (2000 UT) PO CAPS: 1.0000 | ORAL_CAPSULE | Freq: Every day | ORAL | Status: DC

## 2013-07-07 ENCOUNTER — Other Ambulatory Visit: Payer: Medicare Other

## 2013-07-14 ENCOUNTER — Encounter: Payer: Medicare Other | Admitting: Family Medicine

## 2013-07-16 NOTE — Telephone Encounter (Signed)
error 

## 2013-07-22 ENCOUNTER — Other Ambulatory Visit: Payer: Self-pay | Admitting: Family Medicine

## 2013-07-22 NOTE — Telephone Encounter (Signed)
Received refill request electronically. Last refill 06/03/13 #90, last office visit 06/11/13. Is it okay to refill medication?

## 2013-07-22 NOTE — Telephone Encounter (Signed)
Called in Rx as directed--CS

## 2013-07-22 NOTE — Telephone Encounter (Signed)
Please call in.  Thanks.   

## 2013-08-01 ENCOUNTER — Ambulatory Visit: Payer: Medicare Other | Admitting: Internal Medicine

## 2013-08-14 ENCOUNTER — Ambulatory Visit: Payer: Medicare Other | Admitting: Cardiology

## 2013-08-17 ENCOUNTER — Other Ambulatory Visit: Payer: Self-pay | Admitting: Family Medicine

## 2013-08-18 ENCOUNTER — Encounter: Payer: Self-pay | Admitting: Gastroenterology

## 2013-08-18 ENCOUNTER — Encounter: Payer: Self-pay | Admitting: Family Medicine

## 2013-08-18 ENCOUNTER — Ambulatory Visit (INDEPENDENT_AMBULATORY_CARE_PROVIDER_SITE_OTHER): Payer: Medicare Other | Admitting: Family Medicine

## 2013-08-18 VITALS — BP 132/68 | HR 76 | Temp 98.1°F | Wt 130.8 lb

## 2013-08-18 DIAGNOSIS — R634 Abnormal weight loss: Secondary | ICD-10-CM

## 2013-08-18 DIAGNOSIS — R197 Diarrhea, unspecified: Secondary | ICD-10-CM | POA: Insufficient documentation

## 2013-08-18 DIAGNOSIS — R1110 Vomiting, unspecified: Secondary | ICD-10-CM | POA: Insufficient documentation

## 2013-08-18 NOTE — Telephone Encounter (Signed)
Rx called in as directed.   

## 2013-08-18 NOTE — Progress Notes (Signed)
BP 132/68  Pulse 76  Temp(Src) 98.1 F (36.7 C) (Oral)  Wt 130 lb 12 oz (59.308 kg)   CC: abnormal weight loss  Subjective:    Patient ID: [name], female    DOB: 09/28/1932, 80 y.o.   MRN: [id number]  HPI: [name] is a 80 y.o. female presenting on 08/18/2013 for Diarrhea   Abnormal weight loss - noticing over last several months weight loss.  Not trying.  Reviewing records, weighed 150lbs (11/2012).  1.5 wks ago started having vomiting without nausea along with diarrhea described as watery and tan colored (this started around mother's day as well).  Endorses stool urgency and incontinence. Endorses some early satiety. No sick contacts.  No new restaurants. Dress size 14->8. Wt Readings from Last 3 Encounters:  08/18/13 130 lb 12 oz (59.308 kg)  06/11/13 143 lb 12.8 oz (65.227 kg)  05/15/13 148 lb (67.132 kg)   Body mass index is 21.76 kg/(m^2).  Denies fevers/chills, denies blood in stool or urine, black tarry stools. Denies blood or coffee color in emesis.  Denies abd pain, dysphagia.  States she's backing off xanax.  Colon cancer screening - unsure when maybe 2009, told not to worry about rpt (some doctor at [facility]).  Past Medical History  Diagnosis Date  . Dyslipidemia   . Depression with anxiety     celexa started 04/2010  . Hyperlipidemia   . Hypertension   . CAD (coronary artery disease)     95% LAD stenosis, Cypher x 02 September 1996  . Atrial fibrillation   . Prediabetes 2001  . Peripheral neuropathy   . Cervicalgia   . LBP (low back pain) 2005    spinal stenosis and bulging discs at L2-L5 s/p lumbar laminectomy Dutch Quint([place])  . Osteopenia 07/2013    T score -2.2 L femur  . Gout     prior PCP stopped allopurinol  . CHF (congestive heart failure) 2014    on recent hospitalization  . Arthritis   . Syncopal episodes 2015    orthostatic   Past Surgical History  Procedure Laterality Date  . Lumbar laminectomy  2005    Dr. Dutch QuintPoole  . Tonsillectomy      . Cardiac catheterization    . Percutaneous coronary stent intervention (pci-s)      stent x3  . Cataract extraction Bilateral   . Dexa  07/2013    T score -2.2 L femur    Family History  Problem Relation Age of Onset  . Heart disease Brother   . Heart disease Other     First degree relatives with heart disease but later onset  . Cancer Father     prostate  . Cancer Sister     breast  . Heart disease Sister     pacer   Relevant past medical, surgical, family and social history reviewed and updated as indicated.  Allergies and medications reviewed and updated. Current Outpatient Prescriptions on File Prior to Visit  Medication Sig  . allopurinol (ZYLOPRIM) 100 MG tablet Take 1 tablet (100 mg total) by mouth daily.  Marland Kitchen. antiseptic oral rinse (BIOTENE) LIQD 15 mLs by Mouth Rinse route as needed.  Marland Kitchen. apixaban (ELIQUIS) 5 MG TABS tablet Take 1 tablet (5 mg total) by mouth 2 (two) times daily.  . Cholecalciferol (VITAMIN D) 2000 UNITS CAPS Take 1 capsule (2,000 Units total) by mouth daily.  Marland Kitchen. co-enzyme Q-10 30 MG capsule Take 30 mg by mouth daily.   Marland Kitchen. DIGOX  125 MCG tablet TAKE ONE TABLET EVERY DAY  . levothyroxine (SYNTHROID, LEVOTHROID) 25 MCG tablet Take 1 tablet (25 mcg total) by mouth every Monday, Wednesday, and Friday.  Marland Kitchen. lisinopril (PRINIVIL,ZESTRIL) 5 MG tablet Take 1 tablet (5 mg total) by mouth daily.  . metoprolol (LOPRESSOR) 50 MG tablet Take 1 tablet (50 mg total) by mouth 2 (two) times daily.  . Multiple Vitamin (MULTIVITAMIN) tablet Take 1 tablet by mouth daily.    . nitroGLYCERIN (NITROSTAT) 0.4 MG SL tablet DISSOLVE 1 UNDER TONGUE AS NEEDED FOR CHEST PAIN MAY REPEAT EVERY 5 MIN FOR A TOTAL OF 3 DOSES  . Omega-3 Fatty Acids (FISH OIL) 1000 MG CAPS Take 1 capsule by mouth 2 (two) times daily.  . potassium chloride SA (K-DUR,KLOR-CON) 20 MEQ tablet Take 1 tablet (20 mEq total) by mouth 2 (two) times daily.  . sertraline (ZOLOFT) 25 MG tablet Take 1 tablet (25 mg total) by  mouth daily.  Marland Kitchen. atorvastatin (LIPITOR) 40 MG tablet Take 1 tablet (40 mg total) by mouth daily.  Marland Kitchen. HYDROcodone-acetaminophen (NORCO) 10-325 MG per tablet Take 1 tablet by mouth 2 (two) times daily as needed.   . torsemide (DEMADEX) 10 MG tablet Take 1 tablet (10 mg total) by mouth 2 (two) times daily.   No current facility-administered medications on file prior to visit.    Review of Systems Per HPI unless specifically indicated above    Objective:    BP 132/68  Pulse 76  Temp(Src) 98.1 F (36.7 C) (Oral)  Wt 130 lb 12 oz (59.308 kg)  Physical Exam  Nursing note and vitals reviewed. Constitutional: She appears well-developed and well-nourished. No distress.  HENT:  Head: Normocephalic and atraumatic.  Mouth/Throat: Oropharynx is clear and moist. No oropharyngeal exudate.  Neck: Normal range of motion. Neck supple.  Cardiovascular: Normal rate, regular rhythm, normal heart sounds and intact distal pulses.   No murmur heard. Pulmonary/Chest: Effort normal and breath sounds normal. No respiratory distress. She has no wheezes. She has no rales.  Abdominal: Soft. Normal appearance and bowel sounds are normal. She exhibits no distension and no mass. There is no hepatosplenomegaly. There is tenderness (mild) in the epigastric area. There is no rigidity, no rebound, no guarding, no CVA tenderness and negative Murphy's sign.  Firm gallbladder on exam today, nontender  Musculoskeletal: She exhibits no edema.  Lymphadenopathy:    She has no cervical adenopathy.       Assessment & Plan:   Problem List Items Addressed This Visit   Diarrhea   Relevant Orders      CT Abdomen Pelvis W Contrast      Ambulatory referral to Gastroenterology   Loss of weight - Primary      Concerning sxs of unexplained weight loss along with diarrhea and vomiting, and endorsed early satiety. Will need CT scan to further evaluate these findings - r/o pancreatic or liver/gallbladder or stomach malignancy. If  unrevealing, proceed with GI referral for further evaluation - may need colonoscopy/EGD. In interim, rec stop all NSAIDs. Check CBC, CMP and lipase today. Lab Results  Component Value Date   TSH 0.95 06/04/2013      Relevant Orders      Comprehensive metabolic panel      CBC with Differential      Lipase      CT Abdomen Pelvis W Contrast      Ambulatory referral to Gastroenterology   Vomiting   Relevant Orders      CT Abdomen Pelvis  W Contrast      Ambulatory referral to Gastroenterology       Follow up plan: Return in about 1 month (around 09/18/2013), or if symptoms worsen or fail to improve, for follow up visit.

## 2013-08-18 NOTE — Telephone Encounter (Signed)
plz phone in. 

## 2013-08-18 NOTE — Patient Instructions (Signed)
Blood work today. Pass by Marion's office to schedule CT scan - drink plenty of water so your kidneys do ok with the IV contrast. We will also set you up with GI. I'm worried about your weight loss and symptoms.

## 2013-08-18 NOTE — Progress Notes (Signed)
Pre visit review using our clinic review tool, if applicable. No additional management support is needed unless otherwise documented below in the visit note. 

## 2013-08-18 NOTE — Telephone Encounter (Signed)
Received refill request electronically. Last refill 07/03/13 #90, last office visit 06/11/13. Is it okay to refill medication?

## 2013-08-18 NOTE — Assessment & Plan Note (Signed)
Concerning sxs of unexplained weight loss along with diarrhea and vomiting, and endorsed early satiety. Will need CT scan to further evaluate these findings - r/o pancreatic or liver/gallbladder or stomach malignancy. If unrevealing, proceed with GI referral for further evaluation - may need colonoscopy/EGD. In interim, rec stop all NSAIDs. Check CBC, CMP and lipase today. Lab Results  Component Value Date   TSH 0.95 06/04/2013

## 2013-08-19 ENCOUNTER — Encounter: Payer: Self-pay | Admitting: *Deleted

## 2013-08-19 LAB — COMPREHENSIVE METABOLIC PANEL
ALBUMIN: 4.2 g/dL (ref 3.5–5.2)
ALT: 19 U/L (ref 0–35)
AST: 30 U/L (ref 0–37)
Alkaline Phosphatase: 76 U/L (ref 39–117)
BUN: 15 mg/dL (ref 6–23)
CALCIUM: 9.5 mg/dL (ref 8.4–10.5)
CHLORIDE: 102 meq/L (ref 96–112)
CO2: 25 mEq/L (ref 19–32)
Creatinine, Ser: 1 mg/dL (ref 0.4–1.2)
GFR: 54.91 mL/min — ABNORMAL LOW (ref >60.00)
Glucose, Bld: 100 mg/dL — ABNORMAL HIGH (ref 70–99)
POTASSIUM: 3.7 meq/L (ref 3.5–5.1)
SODIUM: 137 meq/L (ref 135–145)
Total Bilirubin: 1 mg/dL (ref 0.2–1.2)
Total Protein: 6.9 g/dL (ref 6.0–8.3)

## 2013-08-19 LAB — CBC WITH DIFFERENTIAL/PLATELET
Basophils Absolute: 0.1 10*3/uL (ref 0.0–0.1)
Basophils Relative: 0.7 % (ref 0.0–3.0)
EOS ABS: 1.1 10*3/uL — AB (ref 0.0–0.7)
Eosinophils Relative: 12.9 % — ABNORMAL HIGH (ref 0.0–5.0)
HCT: 40.6 % (ref 36.0–46.0)
Hemoglobin: 13.5 g/dL (ref 12.0–15.0)
LYMPHS PCT: 18.8 % (ref 12.0–46.0)
Lymphs Abs: 1.6 10*3/uL (ref 0.7–4.0)
MCHC: 33.3 g/dL (ref 30.0–36.0)
MCV: 100.6 fl — ABNORMAL HIGH (ref 78.0–100.0)
MONO ABS: 0.3 10*3/uL (ref 0.1–1.0)
Monocytes Relative: 4 % (ref 3.0–12.0)
NEUTROS ABS: 5.5 10*3/uL (ref 1.4–7.7)
NEUTROS PCT: 63.6 % (ref 43.0–77.0)
Platelets: 237 10*3/uL (ref 150.0–400.0)
RBC: 4.03 Mil/uL (ref 3.87–5.11)
RDW: 12.8 % (ref 11.5–15.5)
WBC: 8.6 10*3/uL (ref 4.0–10.5)

## 2013-08-19 LAB — LIPASE: Lipase: 27 U/L (ref 11.0–59.0)

## 2013-08-20 ENCOUNTER — Other Ambulatory Visit: Payer: Self-pay | Admitting: Family Medicine

## 2013-08-20 NOTE — Telephone Encounter (Signed)
Received refill request electronically from pharmacy. Medication is not on medication list. Last office visit 08/18/13. Is it okay to refill medication?

## 2013-08-20 NOTE — Telephone Encounter (Signed)
plz phone in.  Pt decreasing use.

## 2013-08-21 NOTE — Telephone Encounter (Signed)
Rx called in as directed.   

## 2013-08-22 ENCOUNTER — Other Ambulatory Visit: Payer: Self-pay | Admitting: Family Medicine

## 2013-08-22 ENCOUNTER — Encounter: Payer: Self-pay | Admitting: Family Medicine

## 2013-08-22 ENCOUNTER — Ambulatory Visit (INDEPENDENT_AMBULATORY_CARE_PROVIDER_SITE_OTHER)
Admission: RE | Admit: 2013-08-22 | Discharge: 2013-08-22 | Disposition: A | Discharge status: Home / Self Care | Payer: Medicare Other | Source: Ambulatory Visit | Attending: Family Medicine | Admitting: Family Medicine

## 2013-08-22 DIAGNOSIS — R1110 Vomiting, unspecified: Secondary | ICD-10-CM

## 2013-08-22 DIAGNOSIS — R197 Diarrhea, unspecified: Secondary | ICD-10-CM

## 2013-08-22 DIAGNOSIS — R634 Abnormal weight loss: Secondary | ICD-10-CM

## 2013-08-22 MED ORDER — POTASSIUM CHLORIDE CRYS ER 10 MEQ PO TBCR: 20.0000 meq | EXTENDED_RELEASE_TABLET | Freq: Two times a day (BID) | ORAL | Status: DC

## 2013-08-28 ENCOUNTER — Other Ambulatory Visit: Payer: Self-pay | Admitting: Family Medicine

## 2013-08-30 ENCOUNTER — Other Ambulatory Visit: Payer: Self-pay | Admitting: Family Medicine

## 2013-08-30 ENCOUNTER — Other Ambulatory Visit: Payer: Self-pay | Admitting: Cardiology

## 2013-09-12 ENCOUNTER — Ambulatory Visit: Payer: Medicare Other | Admitting: Family Medicine

## 2013-09-12 ENCOUNTER — Encounter: Payer: Self-pay | Admitting: Cardiology

## 2013-09-12 ENCOUNTER — Ambulatory Visit (INDEPENDENT_AMBULATORY_CARE_PROVIDER_SITE_OTHER): Payer: Medicare Other | Admitting: Cardiology

## 2013-09-12 VITALS — BP 137/80 | HR 84 | Wt 132.0 lb

## 2013-09-12 DIAGNOSIS — I4891 Unspecified atrial fibrillation: Secondary | ICD-10-CM

## 2013-09-12 DIAGNOSIS — Z0289 Encounter for other administrative examinations: Secondary | ICD-10-CM

## 2013-09-12 DIAGNOSIS — I10 Essential (primary) hypertension: Secondary | ICD-10-CM

## 2013-09-12 DIAGNOSIS — I2510 Atherosclerotic heart disease of native coronary artery without angina pectoris: Secondary | ICD-10-CM

## 2013-09-12 NOTE — Progress Notes (Signed)
HPI The patient presents for followup of atrial fibrillation.  She has been on anticoagulation.   Since I last saw her she said she's been okay from a cardiac standpoint. She has had another fall but is vague on describing these. She said she might be sitting there and actually go to the ground. Again she describes standing up and getting lightheaded. We have thought that this was related to orthostasis in the past.  She is not having any trauma. She was better after beta blocker was reduced.  She has slowed down with back pain.  She does some activities of daily living without bringing on any chest pain, neck or arm discomfort. She's not having any new shortness of breath, PND or orthopnea.   Allergies  Allergen Reactions  . Adhesive [Tape]     Rash  . Ivp Dye [Iodinated Diagnostic Agents]   . Magnesium Citrate Hives  . Prednisone   . Vytorin [Ezetimibe-Simvastatin] Other (See Comments)    leg cramps    Current Outpatient Prescriptions  Medication Sig Dispense Refill  . allopurinol (ZYLOPRIM) 100 MG tablet Take 1 tablet (100 mg total) by mouth daily.  30 tablet  11  . ALPRAZolam (XANAX) 0.5 MG tablet TAKE 1 TABLET 3 TIMES A DAY AS NEEDED  60 tablet  0  . antiseptic oral rinse (BIOTENE) LIQD 15 mLs by Mouth Rinse route as needed.      Marland Kitchen. apixaban (ELIQUIS) 5 MG TABS tablet Take 1 tablet (5 mg total) by mouth 2 (two) times daily.  60 tablet  11  . atorvastatin (LIPITOR) 40 MG tablet Take 1 tablet (40 mg total) by mouth daily.  30 tablet  3  . calcium carbonate (OS-CAL) 600 MG TABS tablet Take 600 mg by mouth daily with breakfast.      . Cholecalciferol (VITAMIN D) 2000 UNITS CAPS Take 1 capsule (2,000 Units total) by mouth daily.      Marland Kitchen. co-enzyme Q-10 30 MG capsule Take 30 mg by mouth daily.       Marland Kitchen. DIGOX 125 MCG tablet TAKE ONE TABLET EVERY DAY  30 tablet  3  . hydrochlorothiazide (HYDRODIURIL) 25 MG tablet TAKE ONE TABLET EVERY DAY  30 tablet  11  . HYDROcodone-acetaminophen (NORCO)  10-325 MG per tablet Take 1 tablet by mouth 2 (two) times daily as needed.       Marland Kitchen. lisinopril (PRINIVIL,ZESTRIL) 5 MG tablet Take 1 tablet (5 mg total) by mouth daily.  30 tablet  6  . metoprolol (LOPRESSOR) 50 MG tablet Take 1 tablet (50 mg total) by mouth 2 (two) times daily.  60 tablet  6  . Multiple Vitamin (MULTIVITAMIN) tablet Take 1 tablet by mouth daily.        . nitroGLYCERIN (NITROSTAT) 0.4 MG SL tablet DISSOLVE 1 UNDER TONGUE AS NEEDED FOR CHEST PAIN MAY REPEAT EVERY 5 MIN FOR A TOTAL OF 3 DOSES  25 tablet  6  . Omega-3 Fatty Acids (FISH OIL) 1000 MG CAPS Take 1 capsule by mouth 2 (two) times daily.      . Oxycodone HCl 10 MG TABS AS DIRECTED      . potassium chloride (K-DUR) 10 MEQ tablet AS DIRECTED      . potassium chloride SA (K-DUR,KLOR-CON) 10 MEQ tablet Take 2 tablets (20 mEq total) by mouth 2 (two) times daily.  120 tablet  6  . sertraline (ZOLOFT) 25 MG tablet TAKE ONE TABLET EVERY DAY  30 tablet  3  . torsemide (  DEMADEX) 10 MG tablet Take 1 tablet (10 mg total) by mouth 2 (two) times daily.  30 tablet  11   No current facility-administered medications for this visit.    Past Medical History  Diagnosis Date  . Dyslipidemia   . Depression with anxiety     celexa started 04/2010  . Hyperlipidemia   . Hypertension   . CAD (coronary artery disease)     95% LAD stenosis, Cypher x 02 September 1996  . Atrial fibrillation   . Prediabetes 2001  . Peripheral neuropathy   . Cervicalgia   . LBP (low back pain) 2005    spinal stenosis and bulging discs at L2-L5 s/p lumbar laminectomy Dutch Quint([place])  . Osteopenia 07/2013    T score -2.2 L femur  . Gout     prior PCP stopped allopurinol  . CHF (congestive heart failure) 2014    on recent hospitalization  . Arthritis   . Syncopal episodes 2015    orthostatic  . T12 compression fracture 2015    by CT  . Atherosclerosis of abdominal aorta     Past Surgical History  Procedure Laterality Date  . Lumbar laminectomy  2005    Dr. Dutch QuintPoole    . Tonsillectomy    . Cardiac catheterization    . Percutaneous coronary stent intervention (pci-s)      stent x3  . Cataract extraction Bilateral   . Dexa  07/2013    T score -2.2 L femur    ROS:  Frequent diarrhea.  Otherwise as stated in the HPI and negative for all other systems.  PHYSICAL EXAM BP 137/80  Pulse 84  Wt 132 lb (59.875 kg) GENERAL:  Well appearing but getting more frail.  NECK:  No jugular venous distention, waveform within normal limits, carotid upstroke brisk and symmetric, no bruits, no thyromegaly LUNGS:  Clear to auscultation bilaterally HEART:  PMI not displaced or sustained,S1 and S2 within normal limits, no S3, no clicks, no rubs, no murmurs, irregular ABD:  Flat, positive bowel sounds normal in frequency in pitch, no bruits, no rebound, no guarding, no midline pulsatile mass, no hepatomegaly, no splenomegaly EXT:  2 plus pulses throughout, trace edema, no cyanosis no clubbing SKIN:  No rashes no nodules, there is no obvious mass on her buttocks where she fell. She has no point tenderness there either.  EKG:  Atrial fibrillation, rate 84, low voltage in limb and precordial leads, intervals within normal limits, nonspecific diffuse T wave flattening, no change from previous. 09/12/2013  ASSESSMENT AND PLAN  DIASTOLIC HF:  She seems to be euvolemic.  At this point, no change in therapy is indicated.    CAD -  She had a negative stress perfusion study last year.  No testing is indicated at this point.   ESSENTIAL HYPERTENSION, BENIGN -  The blood pressure is at target. No change in medications is indicated. We will continue with therapeutic lifestyle changes (TLC).  ATRIAL FIBRILLATION -   Ms. Aram Beechamlizabeth D Lythgoe has a CHA2DS2 - VASc score of 5 with a risk of stroke of 6.7%  and a HAS - BLED score of 2 with a low risk of bleeds.   She is tolerating anticoagulation.  Despite the falls the risk benefit of anticoagulation still fall on the side of continuing  this.    SYNCOPE - This likely was related to low blood pressures. She has had an extensive work up.  No change in therapy or further study is indicated.

## 2013-09-12 NOTE — Patient Instructions (Signed)
The current medical regimen is effective;  continue present plan and medications.  Follow up in 1 year with Dr Hochrein.  You will receive a letter in the mail 2 months before you are due.  Please call us when you receive this letter to schedule your follow up appointment.  

## 2013-09-24 ENCOUNTER — Telehealth: Payer: Self-pay | Admitting: Family Medicine

## 2013-09-24 NOTE — Telephone Encounter (Signed)
Pt called, having diarrhea approximately 15 times a day, states mostly water.  Unblocked Dr. Sharen HonesGutierrez' schedule for 09/25/13 9:30 am for acute visit.  Spoke with patient she understands and will arrive at 9:15 am

## 2013-09-25 ENCOUNTER — Encounter: Payer: Self-pay | Admitting: Family Medicine

## 2013-09-25 ENCOUNTER — Ambulatory Visit (INDEPENDENT_AMBULATORY_CARE_PROVIDER_SITE_OTHER): Payer: Medicare Other | Admitting: Family Medicine

## 2013-09-25 VITALS — BP 138/82 | HR 91 | Temp 97.9°F | Wt 130.5 lb

## 2013-09-25 DIAGNOSIS — R197 Diarrhea, unspecified: Secondary | ICD-10-CM

## 2013-09-25 NOTE — Progress Notes (Signed)
Pre visit review using our clinic review tool, if applicable. No additional management support is needed unless otherwise documented below in the visit note. 

## 2013-09-25 NOTE — Progress Notes (Signed)
BP 138/82  Pulse 91  Temp(Src) 97.9 F (36.6 C) (Oral)  Wt 130 lb 8 oz (59.194 kg)  SpO2 95%   CC: diarrhea  Subjective:    Patient ID: [name], female    DOB: 07/11/1932, 80 y.o.   MRN: [id number]  HPI: Sherry Beechamlizabeth D Thomas is a 80 y.o. female presenting on 09/25/2013 for Diarrhea   See prior note for details.  Noted weight loss associated with vomiting/diarrhea. Ongoing diarrhea since 05/2013. Workup last month included stable CBC, CMP, and lipase. She did have eosinophilia on CBC. She endorses worsened watery diarrhea over the last week - 15-25 times in one day. Endorses abd cramping around BMs, not relieved by BM. No fevers/chills. No blood in stool, no black tarry stools. No new restaurants. No recent travel. Uses city water. Has tried pepto bismol and immodium. She has appt with GI next week. Staying well hydrated. Wt Readings from Last 3 Encounters:  09/25/13 130 lb 8 oz (59.194 kg)  09/12/13 132 lb (59.875 kg)  08/18/13 130 lb 12 oz (59.308 kg)  Body mass index is 21.72 kg/(m^2).  Colon cancer screening - unsure when maybe 2009, told not to worry about rpt (some doctor at [facility]).  CT ABDOMEN AND PELVIS WITHOUT CONTRAST  TECHNIQUE:  Multidetector CT imaging of the abdomen and pelvis was performed  following the standard protocol without IV contrast.  COMPARISON: None.  FINDINGS:  Degenerative disc disease is noted at L4-5 and L5-S1. Old  compression deformity of T12 vertebral body is noted. Visualized  lung bases appear normal.  No gallstones are noted. No focal abnormality is seen in the liver,  spleen and pancreas on these unenhanced images. Adrenal glands  appear normal. 5.5 cm simple cyst arises from upper pole of right  kidney. No renal or ureteral calculi are noted. No hydronephrosis or  renal obstruction is noted. Atherosclerotic calcifications of  abdominal aorta are noted without aneurysm formation. The appendix  appears normal. There is no  evidence of bowel obstruction. No  abnormal fluid collection is noted. Uterus and urinary bladder  appear normal. Visualize ovaries appear normal. No significant  adenopathy is noted.  IMPRESSION:  No acute abnormality seen in the abdomen or pelvis.  Electronically Signed  By: Roque LiasJames Green M.D.  On: 08/22/2013 15:19  Relevant past medical, surgical, family and social history reviewed and updated as indicated.  Allergies and medications reviewed and updated. Current Outpatient Prescriptions on File Prior to Visit  Medication Sig  . allopurinol (ZYLOPRIM) 100 MG tablet Take 1 tablet (100 mg total) by mouth daily.  Marland Kitchen. ALPRAZolam (XANAX) 0.5 MG tablet TAKE 1 TABLET 3 TIMES A DAY AS NEEDED  . antiseptic oral rinse (BIOTENE) LIQD 15 mLs by Mouth Rinse route as needed.  Marland Kitchen. apixaban (ELIQUIS) 5 MG TABS tablet Take 1 tablet (5 mg total) by mouth 2 (two) times daily.  Marland Kitchen. atorvastatin (LIPITOR) 40 MG tablet Take 1 tablet (40 mg total) by mouth daily.  . calcium carbonate (OS-CAL) 600 MG TABS tablet Take 600 mg by mouth daily with breakfast.  . Cholecalciferol (VITAMIN D) 2000 UNITS CAPS Take 1 capsule (2,000 Units total) by mouth daily.  Marland Kitchen. co-enzyme Q-10 30 MG capsule Take 30 mg by mouth daily.   Marland Kitchen. DIGOX 125 MCG tablet TAKE ONE TABLET EVERY DAY  . hydrochlorothiazide (HYDRODIURIL) 25 MG tablet TAKE ONE TABLET EVERY DAY  . HYDROcodone-acetaminophen (NORCO) 10-325 MG per tablet Take 1 tablet by mouth 2 (two) times daily as needed.   .Marland Kitchen  lisinopril (PRINIVIL,ZESTRIL) 5 MG tablet Take 1 tablet (5 mg total) by mouth daily.  . metoprolol (LOPRESSOR) 50 MG tablet Take 1 tablet (50 mg total) by mouth 2 (two) times daily.  . Multiple Vitamin (MULTIVITAMIN) tablet Take 1 tablet by mouth daily.    . nitroGLYCERIN (NITROSTAT) 0.4 MG SL tablet DISSOLVE 1 UNDER TONGUE AS NEEDED FOR CHEST PAIN MAY REPEAT EVERY 5 MIN FOR A TOTAL OF 3 DOSES  . Omega-3 Fatty Acids (FISH OIL) 1000 MG CAPS Take 1 capsule by mouth 2 (two)  times daily.  . Oxycodone HCl 10 MG TABS AS DIRECTED  . potassium chloride SA (K-DUR,KLOR-CON) 10 MEQ tablet Take 2 tablets (20 mEq total) by mouth 2 (two) times daily.  . sertraline (ZOLOFT) 25 MG tablet TAKE ONE TABLET EVERY DAY  . torsemide (DEMADEX) 10 MG tablet Take 1 tablet (10 mg total) by mouth 2 (two) times daily.   No current facility-administered medications on file prior to visit.    Review of Systems Per HPI unless specifically indicated above    Objective:    BP 138/82  Pulse 91  Temp(Src) 97.9 F (36.6 C) (Oral)  Wt 130 lb 8 oz (59.194 kg)  SpO2 95%  Physical Exam  Nursing note and vitals reviewed. Constitutional: She appears well-developed and well-nourished. No distress.  Frail but well appearing  HENT:  Mouth/Throat: Oropharynx is clear and moist. No oropharyngeal exudate.  Cardiovascular: Normal rate, normal heart sounds and intact distal pulses.  An irregularly irregular rhythm present.  No murmur heard. Pulmonary/Chest: Effort normal and breath sounds normal. No respiratory distress. She has no wheezes. She has no rales.  Abdominal: Soft. Normal appearance. She exhibits no distension and no mass. Bowel sounds are increased. There is no hepatosplenomegaly. There is no tenderness. There is no rigidity, no rebound, no guarding, no CVA tenderness and negative Murphy's sign.  Musculoskeletal: She exhibits no edema.   Results for orders placed in visit on 08/18/13  COMPREHENSIVE METABOLIC PANEL      Result Value Ref Range   Sodium 137  135 - 145 mEq/L   Potassium 3.7  3.5 - 5.1 mEq/L   Chloride 102  96 - 112 mEq/L   CO2 25  19 - 32 mEq/L   Glucose, Bld 100 (*) 70 - 99 mg/dL   BUN 15  6 - 23 mg/dL   Creatinine, Ser 1.0  0.4 - 1.2 mg/dL   Total Bilirubin 1.0  0.2 - 1.2 mg/dL   Alkaline Phosphatase 76  39 - 117 U/L   AST 30  0 - 37 U/L   ALT 19  0 - 35 U/L   Total Protein 6.9  6.0 - 8.3 g/dL   Albumin 4.2  3.5 - 5.2 g/dL   Calcium 9.5  8.4 - 40.9 mg/dL    GFR 81.19 (*) >14.78 mL/min  CBC WITH DIFFERENTIAL      Result Value Ref Range   WBC 8.6  4.0 - 10.5 K/uL   RBC 4.03  3.87 - 5.11 Mil/uL   Hemoglobin 13.5  12.0 - 15.0 g/dL   HCT 29.5  62.1 - 30.8 %   MCV 100.6 (*) 78.0 - 100.0 fl   MCHC 33.3  30.0 - 36.0 g/dL   RDW 65.7  84.6 - 96.2 %   Platelets 237.0  150.0 - 400.0 K/uL   Neutrophils Relative % 63.6  43.0 - 77.0 %   Lymphocytes Relative 18.8  12.0 - 46.0 %   Monocytes Relative 4.0  3.0 - 12.0 %   Eosinophils Relative 12.9 (*) 0.0 - 5.0 %   Basophils Relative 0.7  0.0 - 3.0 %   Neutro Abs 5.5  1.4 - 7.7 K/uL   Lymphs Abs 1.6  0.7 - 4.0 K/uL   Monocytes Absolute 0.3  0.1 - 1.0 K/uL   Eosinophils Absolute 1.1 (*) 0.0 - 0.7 K/uL   Basophils Absolute 0.1  0.0 - 0.1 K/uL  LIPASE      Result Value Ref Range   Lipase 27.0  11.0 - 59.0 U/L      Assessment & Plan:   Problem List Items Addressed This Visit   Diarrhea - Primary     Worsening diarrhea over last week, with numerous watery stools per day. Will check for infectious causes - lactoferrin, iFOB, stool culture and c diff eia. Did not send O&P today. Encouraged to continue pushing small sips of fluids as well as ok to try immodium to slow diarrhea. Low threshold to treat with cipro course. Will await GI eval next week - referred for diarrhea, vomiting and ongoing weight loss (which seems stable today)    Relevant Orders      Stool culture      Clostridium difficile EIA      Fecal Lactoferrin      Fecal occult blood, imunochemical       Follow up plan: Return if symptoms worsen or fail to improve.

## 2013-09-25 NOTE — Patient Instructions (Signed)
Stool kits today (culture and c diff test and testing for blood). We will call you with results. May take immodium over the counter for diarrhea.  Take up to 2 a day for now. Keep appointment with Dr. Christella HartiganJacobs.

## 2013-09-25 NOTE — Assessment & Plan Note (Signed)
Worsening diarrhea over last week, with numerous watery stools per day. Will check for infectious causes - lactoferrin, iFOB, stool culture and c diff eia. Did not send O&P today. Encouraged to continue pushing small sips of fluids as well as ok to try immodium to slow diarrhea. Low threshold to treat with cipro course. Will await GI eval next week - referred for diarrhea, vomiting and ongoing weight loss (which seems stable today)

## 2013-09-26 ENCOUNTER — Other Ambulatory Visit: Payer: Medicare Other

## 2013-09-26 ENCOUNTER — Other Ambulatory Visit: Payer: Self-pay | Admitting: Family Medicine

## 2013-09-26 DIAGNOSIS — R197 Diarrhea, unspecified: Secondary | ICD-10-CM

## 2013-09-26 LAB — FECAL OCCULT BLOOD, IMMUNOCHEMICAL: FECAL OCCULT BLD: POSITIVE — AB

## 2013-09-27 LAB — FECAL LACTOFERRIN, QUANT: LACTOFERRIN: NEGATIVE

## 2013-09-27 LAB — C. DIFFICILE GDH AND TOXIN A/B
C. DIFFICILE GDH: NOT DETECTED
C. difficile Toxin A/B: NOT DETECTED

## 2013-09-30 ENCOUNTER — Ambulatory Visit (INDEPENDENT_AMBULATORY_CARE_PROVIDER_SITE_OTHER): Payer: Medicare Other | Admitting: Gastroenterology

## 2013-09-30 ENCOUNTER — Telehealth: Payer: Self-pay

## 2013-09-30 ENCOUNTER — Encounter: Payer: Self-pay | Admitting: Gastroenterology

## 2013-09-30 ENCOUNTER — Other Ambulatory Visit: Payer: Self-pay

## 2013-09-30 VITALS — BP 120/60 | HR 68 | Ht 62.5 in | Wt 130.1 lb

## 2013-09-30 DIAGNOSIS — R197 Diarrhea, unspecified: Secondary | ICD-10-CM

## 2013-09-30 LAB — STOOL CULTURE

## 2013-09-30 MED ORDER — MOVIPREP 100 G PO SOLR: 1.0000 | Freq: Once | ORAL | Status: DC

## 2013-09-30 MED ORDER — METRONIDAZOLE 250 MG PO TABS: 250.0000 mg | ORAL_TABLET | Freq: Three times a day (TID) | ORAL | Status: DC

## 2013-09-30 NOTE — Telephone Encounter (Signed)
Pt aware and will call with any questions

## 2013-09-30 NOTE — Progress Notes (Signed)
HPI: This is a  very pleasant, talkative 81-year-old woman whom I am meeting for the first time today.  Has lost 20-30 pounds in past year or so.  Diarrhea started this past February.  Watery diarrhea, never bloody.  Can go as many as 10-20 times per day.  Abd pain, lower a "low gripe"  CT recent (without IV contrast) was normal.  The CT scan was May 2015  Colonoscopy about 10 years ago; at [facility] and she tells me it was normal.  She has fecal incontinence.  Never on antibiotics that she can recall.  She tried imodium, pepto and these did not help.  Vomiting a lot in February and once or twice since then.  Labs may 2015 show essentially normal CBC except for eosinophil percent is elevated at 12%. Stool for Clostridium difficile by toxin and GDH were both normal. Fecal active. Negative.  Stool testing June 2015 shows positive for Hemoccult.   Review of systems: Pertinent positive and negative review of systems were noted in the above HPI section. Complete review of systems was performed and was otherwise normal.    Past Medical History  Diagnosis Date  . Dyslipidemia   . Depression with anxiety     celexa started 04/2010  . Hyperlipidemia   . Hypertension   . CAD (coronary artery disease)     95% LAD stenosis, Cypher x 02 September 1996  . Atrial fibrillation   . Prediabetes 2001  . Peripheral neuropathy   . Cervicalgia   . LBP (low back pain) 2005    spinal stenosis and bulging discs at L2-L5 s/p lumbar laminectomy (Poole)  . Osteopenia 07/2013    T score -2.2 L femur  . Gout     prior PCP stopped allopurinol  . CHF (congestive heart failure) 2014    on recent hospitalization  . Arthritis   . Syncopal episodes 2015    orthostatic  . T12 compression fracture 2015    by CT  . Atherosclerosis of abdominal aorta     Past Surgical History  Procedure Laterality Date  . Lumbar laminectomy  2005    Dr. Poole  . Tonsillectomy    . Cardiac catheterization    .  Percutaneous coronary stent intervention (pci-s)      stent x3  . Cataract extraction Bilateral   . Dexa  07/2013    T score -2.2 L femur    Current Outpatient Prescriptions  Medication Sig Dispense Refill  . allopurinol (ZYLOPRIM) 100 MG tablet Take 1 tablet (100 mg total) by mouth daily.  30 tablet  11  . ALPRAZolam (XANAX) 0.5 MG tablet TAKE 1 TABLET 3 TIMES A DAY AS NEEDED  60 tablet  0  . antiseptic oral rinse (BIOTENE) LIQD 15 mLs by Mouth Rinse route as needed.      . apixaban (ELIQUIS) 5 MG TABS tablet Take 1 tablet (5 mg total) by mouth 2 (two) times daily.  60 tablet  11  . atorvastatin (LIPITOR) 40 MG tablet Take 1 tablet (40 mg total) by mouth daily.  30 tablet  3  . calcium carbonate (OS-CAL) 600 MG TABS tablet Take 600 mg by mouth daily with breakfast.      . Cholecalciferol (VITAMIN D) 2000 UNITS CAPS Take 1 capsule (2,000 Units total) by mouth daily.      . co-enzyme Q-10 30 MG capsule Take 30 mg by mouth daily.       . DIGOX 125 MCG tablet TAKE ONE TABLET   EVERY DAY  30 tablet  3  . hydrochlorothiazide (HYDRODIURIL) 25 MG tablet TAKE ONE TABLET EVERY DAY  30 tablet  11  . HYDROcodone-acetaminophen (NORCO) 10-325 MG per tablet Take 1 tablet by mouth 2 (two) times daily as needed.       . lisinopril (PRINIVIL,ZESTRIL) 5 MG tablet Take 1 tablet (5 mg total) by mouth daily.  30 tablet  6  . metoprolol (LOPRESSOR) 50 MG tablet Take 1 tablet (50 mg total) by mouth 2 (two) times daily.  60 tablet  6  . Multiple Vitamin (MULTIVITAMIN) tablet Take 1 tablet by mouth daily.        . nitroGLYCERIN (NITROSTAT) 0.4 MG SL tablet DISSOLVE 1 UNDER TONGUE AS NEEDED FOR CHEST PAIN MAY REPEAT EVERY 5 MIN FOR A TOTAL OF 3 DOSES  25 tablet  6  . Omega-3 Fatty Acids (FISH OIL) 1000 MG CAPS Take 1 capsule by mouth 2 (two) times daily.      . Oxycodone HCl 10 MG TABS AS DIRECTED      . potassium chloride SA (K-DUR,KLOR-CON) 10 MEQ tablet Take 2 tablets (20 mEq total) by mouth 2 (two) times daily.   120 tablet  6  . sertraline (ZOLOFT) 25 MG tablet TAKE ONE TABLET EVERY DAY  30 tablet  3  . torsemide (DEMADEX) 10 MG tablet Take 1 tablet (10 mg total) by mouth 2 (two) times daily.  30 tablet  11   No current facility-administered medications for this visit.    Allergies as of 09/30/2013 - Review Complete 09/30/2013  Allergen Reaction Noted  . Adhesive [tape]  09/27/2012  . Ivp dye [iodinated diagnostic agents]  09/15/2010  . Magnesium citrate Hives 11/27/2012  . Prednisone  07/17/2011  . Vytorin [ezetimibe-simvastatin] Other (See Comments)     Family History  Problem Relation Age of Onset  . Heart disease Brother   . Heart disease Other     First degree relatives with heart disease but later onset  . Prostate cancer Father   . Breast cancer Sister   . Heart disease Sister     pacer    History   Social History  . Marital Status: Widowed    Spouse Name: N/A    Number of Children: 1  . Years of Education: N/A   Occupational History  .     Social History Main Topics  . Smoking status: Never Smoker   . Smokeless tobacco: Never Used  . Alcohol Use: Yes     Comment: Occasionally drinks beer  . Drug Use: No  . Sexual Activity: Not on file   Other Topics Concern  . 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   Edu: college       Physical Exam: BP 120/60  Pulse 68  Ht 5' 2.5" (1.588 m)  Wt 130 lb 2 oz (59.024 kg)  BMI 23.41 kg/m2 Constitutional: generally well-appearing Psychiatric: alert and oriented x3 Eyes: extraocular movements intact Mouth: oral pharynx moist, no lesions Neck: supple no lymphadenopathy Cardiovascular: heart regular rate and rhythm Lungs: clear to auscultation bilaterally Abdomen: soft, nontender, nondistended, no obvious ascites, no peritoneal signs, normal bowel sounds Extremities: no lower extremity edema  bilaterally Skin: no lesions on visible extremities    Assessment and plan: 81 y.o. female with  several months of watery, nonbloody diarrhea, fecal   occult positive, weight loss  Unclear etiology. I'm going to start her on empiric trial of metronidazole 250 mg 3 times daily for 10 days. After that she will initiate one Imodium every morning to try to slow her down also empirically. She has had quite a lot of lab tests already, summarized above. I think it would like to proceed with colonoscopy at her soonest convenience. We will communicate with her cardiologist about the safety of holding her blood thinner for 2 days prior to colonoscopy.     

## 2013-09-30 NOTE — Telephone Encounter (Signed)
OK to hold anticoagulation as needed for procedure ----- Message ----- From: Donata DuffPatty L Lewis, CMA Sent: 09/30/2013 8:55 AM To: Rollene RotundaJames Hochrein, MD

## 2013-09-30 NOTE — Patient Instructions (Signed)
You will be set up for a colonoscopy. You will need to stop your eliquis medicine for 2 days prior to the colonoscopy, we will ask your cardiologist Dr. Antoine PocheHochrein if that is OK. Empiric trial of flagyl (250mg  po tid for 10 days). After that please start imodium, one pill every morning shortly after you wake.

## 2013-10-02 ENCOUNTER — Other Ambulatory Visit: Payer: Self-pay | Admitting: Cardiology

## 2013-10-02 ENCOUNTER — Encounter: Payer: Self-pay | Admitting: Gastroenterology

## 2013-10-06 ENCOUNTER — Encounter: Payer: Self-pay | Admitting: Family Medicine

## 2013-10-06 ENCOUNTER — Ambulatory Visit (INDEPENDENT_AMBULATORY_CARE_PROVIDER_SITE_OTHER): Payer: Medicare Other | Admitting: Family Medicine

## 2013-10-06 VITALS — BP 110/60 | HR 72 | Temp 97.4°F | Wt 131.5 lb

## 2013-10-06 DIAGNOSIS — I4891 Unspecified atrial fibrillation: Secondary | ICD-10-CM

## 2013-10-06 DIAGNOSIS — R21 Rash and other nonspecific skin eruption: Secondary | ICD-10-CM

## 2013-10-06 DIAGNOSIS — I4819 Other persistent atrial fibrillation: Secondary | ICD-10-CM

## 2013-10-06 DIAGNOSIS — F341 Dysthymic disorder: Secondary | ICD-10-CM

## 2013-10-06 DIAGNOSIS — R197 Diarrhea, unspecified: Secondary | ICD-10-CM

## 2013-10-06 DIAGNOSIS — F418 Other specified anxiety disorders: Secondary | ICD-10-CM

## 2013-10-06 MED ORDER — SERTRALINE HCL 25 MG PO TABS: ORAL_TABLET | ORAL | Status: DC

## 2013-10-06 MED ORDER — APIXABAN 2.5 MG PO TABS: 2.5000 mg | ORAL_TABLET | Freq: Two times a day (BID) | ORAL | Status: DC

## 2013-10-06 NOTE — Assessment & Plan Note (Addendum)
Endorses several episodes over weekend of BRBPR, now has resolved.  She is on eliquis 5mg  bid for atrial fibrillation. Will check CBC / BMP today and decrease eliquis to 2.5mg  bid. Will also notify cards and GI of bleed and my plan to decrease eliquis.  ?sooner colonoscopy. Emphasized need to go to ER if any recurrent blood per rectum.

## 2013-10-06 NOTE — Patient Instructions (Signed)
I've refilled sertraline. Let's decrease eliquis to 2.5mg  twice daily (1/2 tablet of current dose), new dose will be at pharmacy. Blood work today - if abnormal I may ask you to go to ER. If any more bloody stool, please seek care at ER.

## 2013-10-06 NOTE — Progress Notes (Signed)
Pre visit review using our clinic review tool, if applicable. No additional management support is needed unless otherwise documented below in the visit note. 

## 2013-10-06 NOTE — Progress Notes (Signed)
BP 110/60  Pulse 72  Temp(Src) 97.4 F (36.3 C) (Oral)  Wt 131 lb 8 oz (59.648 kg)   CC: f/u diarrhea  Subjective:    Patient ID: [name], female    DOB: 1932/12/14, 80 y.o.   MRN: [id number]  HPI: [name] is a 80 y.o. female presenting on 10/06/2013 for Follow-up   Seen recently by Dr. Ardis Hughs for persistent diarrhea, appreciate GI care of patient. Planned flagyl empiric course then immodium, also schedule for colonoscopy end of July. Over weekend she had several episodes of bright red blood per rectum - this scared her. Endorses imbalance but denies dizziness, denies dyspnea or chest pain. Denies recurrent palpitations. Staying tired.  Skin rash - pruritic erythematous papular rash throughout extremities face and trunk. Has been to 4 different dermatologists. Has not found relief.  Mood - asks about refill of sertraline. States it makes her feel apathetic but also notices she is better able to manage stressful situations.  Weight stable. Wt Readings from Last 3 Encounters:  10/06/13 131 lb 8 oz (59.648 kg)  09/30/13 130 lb 2 oz (59.024 kg)  09/25/13 130 lb 8 oz (59.194 kg)   Lab Results  Component Value Date   WBC 8.6 08/18/2013   HGB 13.5 08/18/2013   HCT 40.6 08/18/2013   MCV 100.6* 08/18/2013   PLT 237.0 08/18/2013     Relevant past medical, surgical, family and social history reviewed and updated as indicated.  Allergies and medications reviewed and updated. Current Outpatient Prescriptions on File Prior to Visit  Medication Sig  . allopurinol (ZYLOPRIM) 100 MG tablet Take 1 tablet (100 mg total) by mouth daily.  Marland Kitchen ALPRAZolam (XANAX) 0.5 MG tablet TAKE 1 TABLET 3 TIMES A DAY AS NEEDED  . antiseptic oral rinse (BIOTENE) LIQD 15 mLs by Mouth Rinse route as needed.  Marland Kitchen atorvastatin (LIPITOR) 40 MG tablet Take 1 tablet (40 mg total) by mouth daily.  . calcium carbonate (OS-CAL) 600 MG TABS tablet Take 600 mg by mouth daily with breakfast.  .  Cholecalciferol (VITAMIN D) 2000 UNITS CAPS Take 1 capsule (2,000 Units total) by mouth daily.  Marland Kitchen co-enzyme Q-10 30 MG capsule Take 30 mg by mouth daily.   Marland Kitchen DIGOX 125 MCG tablet TAKE ONE TABLET EVERY DAY  . hydrochlorothiazide (HYDRODIURIL) 25 MG tablet TAKE ONE TABLET EVERY DAY  . HYDROcodone-acetaminophen (NORCO) 10-325 MG per tablet Take 1 tablet by mouth 2 (two) times daily as needed.   Marland Kitchen lisinopril (PRINIVIL,ZESTRIL) 5 MG tablet TAKE ONE TABLET EVERY DAY  . metoprolol (LOPRESSOR) 50 MG tablet Take 1 tablet (50 mg total) by mouth 2 (two) times daily.  . metroNIDAZOLE (FLAGYL) 250 MG tablet Take 1 tablet (250 mg total) by mouth 3 (three) times daily.  Marland Kitchen MOVIPREP 100 G SOLR Take 1 kit (200 g total) by mouth once.  . Multiple Vitamin (MULTIVITAMIN) tablet Take 1 tablet by mouth daily.    . nitroGLYCERIN (NITROSTAT) 0.4 MG SL tablet DISSOLVE 1 UNDER TONGUE AS NEEDED FOR CHEST PAIN MAY REPEAT EVERY 5 MIN FOR A TOTAL OF 3 DOSES  . Omega-3 Fatty Acids (FISH OIL) 1000 MG CAPS Take 1 capsule by mouth 2 (two) times daily.  . Oxycodone HCl 10 MG TABS AS DIRECTED  . potassium chloride SA (K-DUR,KLOR-CON) 10 MEQ tablet Take 2 tablets (20 mEq total) by mouth 2 (two) times daily.  Marland Kitchen torsemide (DEMADEX) 10 MG tablet Take 1 tablet (10 mg total) by mouth 2 (two) times  daily.   No current facility-administered medications on file prior to visit.    Review of Systems Per HPI unless specifically indicated above    Objective:    BP 110/60  Pulse 72  Temp(Src) 97.4 F (36.3 C) (Oral)  Wt 131 lb 8 oz (59.648 kg)  Physical Exam  Nursing note and vitals reviewed. Constitutional: She appears well-developed and well-nourished. No distress.  HENT:  Mouth/Throat: Oropharynx is clear and moist. No oropharyngeal exudate.  Cardiovascular: Normal rate, normal heart sounds and intact distal pulses.  An irregularly irregular rhythm present.  No murmur heard. Pulmonary/Chest: Effort normal and breath sounds  normal. No respiratory distress. She has no wheezes. She has no rales.  Abdominal: Soft. Bowel sounds are normal. She exhibits no distension and no mass. There is no tenderness. There is no rebound and no guarding.  Genitourinary: Rectal exam shows no external hemorrhoid, no internal hemorrhoid, no fissure, no mass, no tenderness and anal tone normal. Guaiac negative stool.  Raw perianal region  Musculoskeletal: She exhibits no edema.  Skin: Skin is warm and dry. No rash noted.   Results for orders placed in visit on 10/06/13  HEMOCCULT GUIAC POC 1CARD (OFFICE)      Result Value Ref Range   Fecal Occult Blood, POC Negative     Card #1 Date       Card #2 Fecal Occult Blod, POC       Card #2 Date       Card #3 Fecal Occult Blood, POC       Card #3 Date          Assessment & Plan:   Problem List Items Addressed This Visit   SKIN RASH     Focused today on diarrhea, did not further discuss skin rash.    Depression with anxiety     Refilled sertraline.    Bloody diarrhea - Primary     Endorses several episodes over weekend of BRBPR, now has resolved.  She is on eliquis 76m bid for atrial fibrillation. Will check CBC / BMP today and decrease eliquis to 2.5343mbid. Will also notify cards and GI of bleed and my plan to decrease eliquis.  ?sooner colonoscopy. Emphasized need to go to ER if any recurrent blood per rectum.    Relevant Orders      CBC with Differential      Basic metabolic panel      Hemoccult - 1 Card (office) (Completed)   Atrial fibrillation     irregular on exam today. Decrease eliquis to 2.43m343mid, continue metoprolol, digoxin and other meds.    Relevant Medications      apixaban (ELIQUIS) tablet       Follow up plan: Return if symptoms worsen or fail to improve.

## 2013-10-07 ENCOUNTER — Encounter: Payer: Self-pay | Admitting: Family Medicine

## 2013-10-07 LAB — BASIC METABOLIC PANEL
BUN: 10 mg/dL (ref 6–23)
CALCIUM: 9.4 mg/dL (ref 8.4–10.5)
CO2: 28 meq/L (ref 19–32)
Chloride: 100 mEq/L (ref 96–112)
Creatinine, Ser: 1 mg/dL (ref 0.4–1.2)
GFR: 60.26 mL/min (ref >60.00)
GLUCOSE: 100 mg/dL — AB (ref 70–99)
Potassium: 4 mEq/L (ref 3.5–5.1)
SODIUM: 136 meq/L (ref 135–145)

## 2013-10-07 LAB — CBC WITH DIFFERENTIAL/PLATELET
Basophils Absolute: 0.1 10*3/uL (ref 0.0–0.1)
Basophils Relative: 0.9 % (ref 0.0–3.0)
EOS PCT: 9.8 % — AB (ref 0.0–5.0)
Eosinophils Absolute: 0.8 10*3/uL — ABNORMAL HIGH (ref 0.0–0.7)
HEMATOCRIT: 44.7 % (ref 36.0–46.0)
Hemoglobin: 14.6 g/dL (ref 12.0–15.0)
LYMPHS ABS: 1.4 10*3/uL (ref 0.7–4.0)
Lymphocytes Relative: 17.2 % (ref 12.0–46.0)
MCHC: 32.6 g/dL (ref 30.0–36.0)
MCV: 100.8 fl — ABNORMAL HIGH (ref 78.0–100.0)
Monocytes Absolute: 0.6 10*3/uL (ref 0.1–1.0)
Monocytes Relative: 6.7 % (ref 3.0–12.0)
Neutro Abs: 5.4 10*3/uL (ref 1.4–7.7)
Neutrophils Relative %: 65.4 % (ref 43.0–77.0)
Platelets: 271 10*3/uL (ref 150.0–400.0)
RBC: 4.44 Mil/uL (ref 3.87–5.11)
RDW: 13.8 % (ref 11.5–15.5)
WBC: 8.2 10*3/uL (ref 4.0–10.5)

## 2013-10-07 LAB — HEMOCCULT GUIAC POC 1CARD (OFFICE): Fecal Occult Blood, POC: NEGATIVE

## 2013-10-07 NOTE — Assessment & Plan Note (Signed)
Refilled sertraline

## 2013-10-07 NOTE — Assessment & Plan Note (Signed)
Focused today on diarrhea, did not further discuss skin rash.

## 2013-10-07 NOTE — Assessment & Plan Note (Signed)
irregular on exam today. Decrease eliquis to 2.5mg  bid, continue metoprolol, digoxin and other meds.

## 2013-10-08 NOTE — Progress Notes (Signed)
Wynona CanesJavier, We'll try to get her in sooner for colonoscopy.  Thanks  Patty, Can this be done sooner than end of July.  [facility]

## 2013-10-09 ENCOUNTER — Telehealth: Payer: Self-pay

## 2013-10-09 DIAGNOSIS — R197 Diarrhea, unspecified: Secondary | ICD-10-CM

## 2013-10-09 NOTE — Telephone Encounter (Signed)
Dr Christella HartiganJacobs I see where his Eliquis is being decreased but will he need to hold?

## 2013-10-09 NOTE — Telephone Encounter (Signed)
Message copied by Donata DuffLEWIS, Chrisette Man L on Thu Oct 09, 2013  8:14 AM ------      Message from: Rob BuntingJACOBS, DANIEL P      Created: Wed Oct 08, 2013  3:15 PM                   ----- Message -----         From: Eustaquio BoydenJavier Gutierrez, MD         Sent: 10/07/2013   7:01 AM           To: Rachael Feeaniel P Jacobs, MD             ------

## 2013-10-09 NOTE — Telephone Encounter (Signed)
Left message on machine to call back  

## 2013-10-09 NOTE — Telephone Encounter (Signed)
Dr Antoine PocheHochrein is this ok?

## 2013-10-09 NOTE — Telephone Encounter (Signed)
Yes, for 2 days prior as long as OK with Dr. Antoine PocheHochrein.  Thanks

## 2013-10-09 NOTE — Telephone Encounter (Signed)
Can this be done sooner than end of July. [facility]

## 2013-10-09 NOTE — Telephone Encounter (Signed)
OK to hold

## 2013-10-10 ENCOUNTER — Other Ambulatory Visit: Payer: Self-pay

## 2013-10-10 DIAGNOSIS — R197 Diarrhea, unspecified: Secondary | ICD-10-CM

## 2013-10-10 NOTE — Telephone Encounter (Signed)
Pt has been scheduled for 10/16/13 instructions have been mailed to the pt she also needs to be instructed over the phone.  Marchelle Folksmanda did tell the pt it was ok to hold her Eliquis

## 2013-10-10 NOTE — Telephone Encounter (Signed)
Left message on machine to call back  

## 2013-10-10 NOTE — Telephone Encounter (Signed)
Patient notified to hold Eliquis 2 days before her procedure and patient verbalized understanding.

## 2013-10-13 NOTE — Telephone Encounter (Signed)
Message left with family member to have her return call.

## 2013-10-13 NOTE — Telephone Encounter (Signed)
Colon scheduled, pt instructed and medications reviewed.  Patient instructions mailed to home.  Patient to call with any questions or concerns.  

## 2013-10-13 NOTE — Telephone Encounter (Signed)
Left message on machine to call back  

## 2013-10-14 ENCOUNTER — Encounter (HOSPITAL_COMMUNITY): Payer: Self-pay | Admitting: *Deleted

## 2013-10-16 ENCOUNTER — Telehealth: Payer: Self-pay | Admitting: Family Medicine

## 2013-10-16 ENCOUNTER — Ambulatory Visit (HOSPITAL_COMMUNITY): Payer: Medicare Other | Admitting: *Deleted

## 2013-10-16 ENCOUNTER — Encounter (HOSPITAL_COMMUNITY)
Admission: RE | Disposition: A | Discharge status: Home / Self Care | Payer: Self-pay | Source: Ambulatory Visit | Attending: Gastroenterology

## 2013-10-16 ENCOUNTER — Encounter (HOSPITAL_COMMUNITY): Payer: Self-pay | Admitting: *Deleted

## 2013-10-16 ENCOUNTER — Encounter (HOSPITAL_COMMUNITY): Payer: Medicare Other | Admitting: *Deleted

## 2013-10-16 ENCOUNTER — Ambulatory Visit (HOSPITAL_COMMUNITY)
Admission: RE | Admit: 2013-10-16 | Discharge: 2013-10-16 | Disposition: A | Discharge status: Home / Self Care | Payer: Medicare Other | Source: Ambulatory Visit | Attending: Gastroenterology | Admitting: Gastroenterology

## 2013-10-16 DIAGNOSIS — Z7901 Long term (current) use of anticoagulants: Secondary | ICD-10-CM | POA: Diagnosis not present

## 2013-10-16 DIAGNOSIS — R933 Abnormal findings on diagnostic imaging of other parts of digestive tract: Secondary | ICD-10-CM

## 2013-10-16 DIAGNOSIS — R197 Diarrhea, unspecified: Secondary | ICD-10-CM

## 2013-10-16 DIAGNOSIS — E785 Hyperlipidemia, unspecified: Secondary | ICD-10-CM | POA: Insufficient documentation

## 2013-10-16 DIAGNOSIS — I4891 Unspecified atrial fibrillation: Secondary | ICD-10-CM | POA: Insufficient documentation

## 2013-10-16 DIAGNOSIS — F341 Dysthymic disorder: Secondary | ICD-10-CM | POA: Diagnosis not present

## 2013-10-16 DIAGNOSIS — I2510 Atherosclerotic heart disease of native coronary artery without angina pectoris: Secondary | ICD-10-CM | POA: Diagnosis not present

## 2013-10-16 DIAGNOSIS — Z79899 Other long term (current) drug therapy: Secondary | ICD-10-CM | POA: Diagnosis not present

## 2013-10-16 DIAGNOSIS — I700 Atherosclerosis of aorta: Secondary | ICD-10-CM | POA: Insufficient documentation

## 2013-10-16 DIAGNOSIS — K5289 Other specified noninfective gastroenteritis and colitis: Secondary | ICD-10-CM | POA: Insufficient documentation

## 2013-10-16 DIAGNOSIS — M109 Gout, unspecified: Secondary | ICD-10-CM | POA: Diagnosis not present

## 2013-10-16 DIAGNOSIS — I10 Essential (primary) hypertension: Secondary | ICD-10-CM | POA: Insufficient documentation

## 2013-10-16 HISTORY — PX: COLONOSCOPY WITH PROPOFOL: SHX5780

## 2013-10-16 SURGERY — COLONOSCOPY WITH PROPOFOL
Anesthesia: Monitor Anesthesia Care

## 2013-10-16 MED ORDER — MIDAZOLAM HCL 5 MG/5ML IJ SOLN
INTRAMUSCULAR | Status: DC | PRN
  Administered 2013-10-16 (×2): 1 mg via INTRAVENOUS

## 2013-10-16 MED ORDER — LIDOCAINE HCL 1 % IJ SOLN
INTRAMUSCULAR | Status: DC | PRN
  Administered 2013-10-16: 50 mg via INTRADERMAL

## 2013-10-16 MED ORDER — FENTANYL CITRATE 0.05 MG/ML IJ SOLN
INTRAMUSCULAR | Status: AC
  Filled 2013-10-16: qty 2

## 2013-10-16 MED ORDER — LACTATED RINGERS IV SOLN
INTRAVENOUS | Status: DC
Start: 2013-10-16 — End: 2013-10-16
  Administered 2013-10-16: 1000 mL via INTRAVENOUS

## 2013-10-16 MED ORDER — KETAMINE HCL 10 MG/ML IJ SOLN
INTRAMUSCULAR | Status: DC | PRN
  Administered 2013-10-16: 10 mg via INTRAVENOUS

## 2013-10-16 MED ORDER — PROPOFOL INFUSION 10 MG/ML OPTIME
INTRAVENOUS | Status: DC | PRN
  Administered 2013-10-16: 140 ug/kg/min via INTRAVENOUS

## 2013-10-16 MED ORDER — MIDAZOLAM HCL 2 MG/2ML IJ SOLN
INTRAMUSCULAR | Status: AC
  Filled 2013-10-16: qty 2

## 2013-10-16 MED ORDER — LIDOCAINE HCL (CARDIAC) 20 MG/ML IV SOLN
INTRAVENOUS | Status: AC
  Filled 2013-10-16: qty 5

## 2013-10-16 MED ORDER — PROPOFOL 10 MG/ML IV BOLUS
INTRAVENOUS | Status: AC
  Filled 2013-10-16: qty 20

## 2013-10-16 MED ORDER — SODIUM CHLORIDE 0.9 % IV SOLN: INTRAVENOUS | Status: DC

## 2013-10-16 MED ORDER — PROMETHAZINE HCL 25 MG/ML IJ SOLN: 6.2500 mg | INTRAMUSCULAR | Status: DC | PRN

## 2013-10-16 SURGICAL SUPPLY — 22 items

## 2013-10-16 NOTE — Anesthesia Preprocedure Evaluation (Signed)
Anesthesia Evaluation  Patient identified by MRN, date of birth, ID band Patient awake    Reviewed: Allergy & Precautions, H&P , NPO status , Patient's Chart, lab work & pertinent test results  Airway Mallampati: II TM Distance: >3 FB Neck ROM: Full    Dental no notable dental hx.    Pulmonary neg pulmonary ROS,  breath sounds clear to auscultation  Pulmonary exam normal       Cardiovascular hypertension, + CAD, + Cardiac Stents and + Peripheral Vascular Disease + dysrhythmias Atrial Fibrillation Rhythm:Irregular Rate:Normal     Neuro/Psych negative neurological ROS  negative psych ROS   GI/Hepatic negative GI ROS, Neg liver ROS,   Endo/Other  negative endocrine ROS  Renal/GU negative Renal ROS  negative genitourinary   Musculoskeletal negative musculoskeletal ROS (+)   Abdominal   Peds negative pediatric ROS (+)  Hematology negative hematology ROS (+)   Anesthesia Other Findings   Reproductive/Obstetrics negative OB ROS                           Anesthesia Physical Anesthesia Plan  ASA: III  Anesthesia Plan: MAC   Post-op Pain Management:    Induction: Intravenous  Airway Management Planned: Nasal Cannula  Additional Equipment:   Intra-op Plan:   Post-operative Plan:   Informed Consent: I have reviewed the patients History and Physical, chart, labs and discussed the procedure including the risks, benefits and alternatives for the proposed anesthesia with the patient or authorized representative who has indicated his/her understanding and acceptance.   Dental advisory given  Plan Discussed with: CRNA and Surgeon  Anesthesia Plan Comments:         Anesthesia Quick Evaluation

## 2013-10-16 NOTE — Telephone Encounter (Signed)
Patient went for her Colonoscopy today.  They will send you a letter with the results. They found several places that were raw and irritated, but there was no excessive bleeding. Patient said if you need to see her before they send you the results, let her know.

## 2013-10-16 NOTE — Op Note (Signed)
[facility] [address] [place], [id number]   COLONOSCOPY PROCEDURE REPORT  PATIENT: Sherry BeechamWalker, [name].  MR#: [id number] BIRTHDATE: 1932-06-09 , 81  yrs. old GENDER: Female ENDOSCOPIST: Rachael Feeaniel P Jacobs, MD PROCEDURE DATE:  10/16/2013 PROCEDURE:   Colonoscopy with biopsy First Screening Colonoscopy - Avg.  risk and is 50 yrs.  old or older - No.  Prior Negative Screening - Now for repeat screening. N/A  History of Adenoma - Now for follow-up colonoscopy & has been > or = to 3 yrs.  N/A  Polyps Removed Today? No.  Recommend repeat exam, <10 yrs? No. ASA CLASS:   Class III INDICATIONS:chronic (several months) diarrhea. MEDICATIONS: MAC sedation, administered by CRNA  DESCRIPTION OF PROCEDURE:   After the risks benefits and alternatives of the procedure were thoroughly explained, informed consent was obtained.  A digital rectal exam revealed no abnormalities of the rectum.   The Pentax Ped Colon [id number] endoscope was introduced through the anus and advanced to the terminal ileum which was intubated for a short distance. No adverse events experienced.   The quality of the prep was excellent.  The instrument was then slowly withdrawn as the colon was fully examined.   COLON FINDINGS: There were mulitple small petechial type lesions in cecum, this was biopsied.  The terminal ileum was normal.  The colon was somewhat edematous throughout, but not specifically inflammed.  Colon mucosa was randomly biopsied and sent to pathology.  Retroflexed views revealed no abnormalities. The time to cecum=3 minutes 00 seconds.  Withdrawal time=8 minutes 00 seconds.  The scope was withdrawn and the procedure completed. COMPLICATIONS: There were no complications.  ENDOSCOPIC IMPRESSION: There were mulitple small petechial type lesions in cecum, this was biopsied.  The terminal ileum was normal.  The colon was somewhat edematous throughout, but not specifically inflammed.  Colon  mucosa was randomly biopsied and sent to pathology.  RECOMMENDATIONS: Await biopsy results For now, please start one to two imodium (over the counter medicine) every morning shortly after waking.   eSigned:  Rachael Feeaniel P Jacobs, MD 10/16/2013 9:36 AM

## 2013-10-16 NOTE — Transfer of Care (Signed)
Immediate Anesthesia Transfer of Care Note  Patient: [name]  Procedure(s) Performed: Procedure(s) (LRB): COLONOSCOPY WITH PROPOFOL (N/A)  Patient Location: PACU  Anesthesia Type: MAC  Level of Consciousness: sedated, patient cooperative and responds to stimulation  Airway & Oxygen Therapy: Patient Spontanous Breathing and Patient connected to face mask oxgen  Post-op Assessment: Report given to PACU RN and Post -op Vital signs reviewed and stable  Post vital signs: Reviewed and stable  Complications: No apparent anesthesia complications.

## 2013-10-16 NOTE — H&P (View-Only) (Signed)
HPI: This is a  very pleasant, talkative 80 year old woman whom I am meeting for the first time today.  Has lost 20-30 pounds in past year or so.  Diarrhea started this past February.  Watery diarrhea, never bloody.  Can go as many as 10-20 times per day.  Abd pain, lower a "low gripe"  CT recent (without IV contrast) was normal.  The CT scan was May 2015  Colonoscopy about 10 years ago; at [facility] and she tells me it was normal.  She has fecal incontinence.  Never on antibiotics that she can recall.  She tried imodium, pepto and these did not help.  Vomiting a lot in February and once or twice since then.  Labs may 2015 show essentially normal CBC except for eosinophil percent is elevated at 12%. Stool for Clostridium difficile by toxin and GDH were both normal. Fecal active. Negative.  Stool testing June 2015 shows positive for Hemoccult.   Review of systems: Pertinent positive and negative review of systems were noted in the above HPI section. Complete review of systems was performed and was otherwise normal.    Past Medical History  Diagnosis Date  . Dyslipidemia   . Depression with anxiety     celexa started 04/2010  . Hyperlipidemia   . Hypertension   . CAD (coronary artery disease)     95% LAD stenosis, Cypher x 02 September 1996  . Atrial fibrillation   . Prediabetes 2001  . Peripheral neuropathy   . Cervicalgia   . LBP (low back pain) 2005    spinal stenosis and bulging discs at L2-L5 s/p lumbar laminectomy [facility] Quint)  . Osteopenia 07/2013    T score -2.2 L femur  . Gout     prior PCP stopped allopurinol  . CHF (congestive heart failure) 2014    on recent hospitalization  . Arthritis   . Syncopal episodes 2015    orthostatic  . T12 compression fracture 2015    by CT  . Atherosclerosis of abdominal aorta     Past Surgical History  Procedure Laterality Date  . Lumbar laminectomy  2005    Dr. Dutch Quint  . Tonsillectomy    . Cardiac catheterization    .  Percutaneous coronary stent intervention (pci-s)      stent x3  . Cataract extraction Bilateral   . Dexa  07/2013    T score -2.2 L femur    Current Outpatient Prescriptions  Medication Sig Dispense Refill  . allopurinol (ZYLOPRIM) 100 MG tablet Take 1 tablet (100 mg total) by mouth daily.  30 tablet  11  . ALPRAZolam (XANAX) 0.5 MG tablet TAKE 1 TABLET 3 TIMES A DAY AS NEEDED  60 tablet  0  . antiseptic oral rinse (BIOTENE) LIQD 15 mLs by Mouth Rinse route as needed.      Marland Kitchen apixaban (ELIQUIS) 5 MG TABS tablet Take 1 tablet (5 mg total) by mouth 2 (two) times daily.  60 tablet  11  . atorvastatin (LIPITOR) 40 MG tablet Take 1 tablet (40 mg total) by mouth daily.  30 tablet  3  . calcium carbonate (OS-CAL) 600 MG TABS tablet Take 600 mg by mouth daily with breakfast.      . Cholecalciferol (VITAMIN D) 2000 UNITS CAPS Take 1 capsule (2,000 Units total) by mouth daily.      Marland Kitchen co-enzyme Q-10 30 MG capsule Take 30 mg by mouth daily.       Marland Kitchen DIGOX 125 MCG tablet TAKE ONE TABLET  EVERY DAY  30 tablet  3  . hydrochlorothiazide (HYDRODIURIL) 25 MG tablet TAKE ONE TABLET EVERY DAY  30 tablet  11  . HYDROcodone-acetaminophen (NORCO) 10-325 MG per tablet Take 1 tablet by mouth 2 (two) times daily as needed.       Marland Kitchen. lisinopril (PRINIVIL,ZESTRIL) 5 MG tablet Take 1 tablet (5 mg total) by mouth daily.  30 tablet  6  . metoprolol (LOPRESSOR) 50 MG tablet Take 1 tablet (50 mg total) by mouth 2 (two) times daily.  60 tablet  6  . Multiple Vitamin (MULTIVITAMIN) tablet Take 1 tablet by mouth daily.        . nitroGLYCERIN (NITROSTAT) 0.4 MG SL tablet DISSOLVE 1 UNDER TONGUE AS NEEDED FOR CHEST PAIN MAY REPEAT EVERY 5 MIN FOR A TOTAL OF 3 DOSES  25 tablet  6  . Omega-3 Fatty Acids (FISH OIL) 1000 MG CAPS Take 1 capsule by mouth 2 (two) times daily.      . Oxycodone HCl 10 MG TABS AS DIRECTED      . potassium chloride SA (K-DUR,KLOR-CON) 10 MEQ tablet Take 2 tablets (20 mEq total) by mouth 2 (two) times daily.   120 tablet  6  . sertraline (ZOLOFT) 25 MG tablet TAKE ONE TABLET EVERY DAY  30 tablet  3  . torsemide (DEMADEX) 10 MG tablet Take 1 tablet (10 mg total) by mouth 2 (two) times daily.  30 tablet  11   No current facility-administered medications for this visit.    Allergies as of 09/30/2013 - Review Complete 09/30/2013  Allergen Reaction Noted  . Adhesive [tape]  09/27/2012  . Ivp dye [iodinated diagnostic agents]  09/15/2010  . Magnesium citrate Hives 11/27/2012  . Prednisone  07/17/2011  . Vytorin [ezetimibe-simvastatin] Other (See Comments)     Family History  Problem Relation Age of Onset  . Heart disease Brother   . Heart disease Other     First degree relatives with heart disease but later onset  . Prostate cancer Father   . Breast cancer Sister   . Heart disease Sister     pacer    History   Social History  . Marital Status: Widowed    Spouse Name: N/A    Number of Children: 1  . Years of Education: N/A   Occupational History  .     Social History Main Topics  . Smoking status: Never Smoker   . Smokeless tobacco: Never Used  . Alcohol Use: Yes     Comment: Occasionally drinks beer  . Drug Use: No  . Sexual Activity: Not on file   Other Topics Concern  . 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   Edu: college       Physical Exam: BP 120/60  Pulse 68  Ht 5' 2.5" (1.588 m)  Wt 130 lb 2 oz (59.024 kg)  BMI 23.41 kg/m2 Constitutional: generally well-appearing Psychiatric: alert and oriented x3 Eyes: extraocular movements intact Mouth: oral pharynx moist, no lesions Neck: supple no lymphadenopathy Cardiovascular: heart regular rate and rhythm Lungs: clear to auscultation bilaterally Abdomen: soft, nontender, nondistended, no obvious ascites, no peritoneal signs, normal bowel sounds Extremities: no lower extremity edema  bilaterally Skin: no lesions on visible extremities    Assessment and plan: 80 y.o. female with  several months of watery, nonbloody diarrhea, fecal  occult positive, weight loss  Unclear etiology. I'm going to start her on empiric trial of metronidazole 250 mg 3 times daily for 10 days. After that she will initiate one Imodium every morning to try to slow her down also empirically. She has had quite a lot of lab tests already, summarized above. I think it would like to proceed with colonoscopy at her soonest convenience. We will communicate with her cardiologist about the safety of holding her blood thinner for 2 days prior to colonoscopy.

## 2013-10-16 NOTE — Anesthesia Postprocedure Evaluation (Signed)
  Anesthesia Post-op Note  Patient: [name]  Procedure(s) Performed: Procedure(s) (LRB): COLONOSCOPY WITH PROPOFOL (N/A)  Patient Location: PACU  Anesthesia Type: MAC  Level of Consciousness: awake and alert   Airway and Oxygen Therapy: Patient Spontanous Breathing  Post-op Pain: mild  Post-op Assessment: Post-op Vital signs reviewed, Patient's Cardiovascular Status Stable, Respiratory Function Stable, Patent Airway and No signs of Nausea or vomiting  Last Vitals:  Filed Vitals:   10/16/13 0938  BP: 104/56  Pulse: 80  Temp: 36.4 C  Resp: 14    Post-op Vital Signs: stable   Complications: No apparent anesthesia complications

## 2013-10-16 NOTE — Interval H&P Note (Signed)
History and Physical Interval Note:  10/16/2013 8:50 AM  Aram BeechamElizabeth D Arntson  has presented today for surgery, with the diagnosis of diarrhea  The various methods of treatment have been discussed with the patient and family. After consideration of risks, benefits and other options for treatment, the patient has consented to  Procedure(s): COLONOSCOPY WITH PROPOFOL (N/A) as a surgical intervention .  The patient's history has been reviewed, patient examined, no change in status, stable for surgery.  I have reviewed the patient's chart and labs.  Questions were answered to the patient's satisfaction.     Rachael FeeJacobs, Daniel P

## 2013-10-16 NOTE — Discharge Instructions (Signed)

## 2013-10-17 ENCOUNTER — Encounter (HOSPITAL_COMMUNITY): Payer: Self-pay | Admitting: Gastroenterology

## 2013-10-17 NOTE — Telephone Encounter (Signed)
Patient notified

## 2013-10-17 NOTE — Telephone Encounter (Signed)
Yes colonoscopy would check for this - still waiting on pathology results.

## 2013-10-17 NOTE — Telephone Encounter (Signed)
Patient wants to know if she was checked for Colitis.

## 2013-10-18 ENCOUNTER — Other Ambulatory Visit: Payer: Self-pay | Admitting: Family Medicine

## 2013-10-18 MED ORDER — FLUOXETINE HCL 20 MG PO TABS: 20.0000 mg | ORAL_TABLET | Freq: Every day | ORAL | Status: DC

## 2013-10-21 ENCOUNTER — Telehealth: Payer: Self-pay | Admitting: Family Medicine

## 2013-10-21 NOTE — Telephone Encounter (Signed)
Spoke with patient.

## 2013-10-21 NOTE — Telephone Encounter (Signed)
Pt called stating she missed a call from our office at 9:21 am this morning. Did not see note in chart where anyone had called her. Pt request call back.

## 2013-10-22 ENCOUNTER — Ambulatory Visit: Payer: Medicare Other | Admitting: Family Medicine

## 2013-10-22 DIAGNOSIS — Z0289 Encounter for other administrative examinations: Secondary | ICD-10-CM

## 2013-10-27 ENCOUNTER — Telehealth: Payer: Self-pay

## 2013-10-27 ENCOUNTER — Other Ambulatory Visit: Payer: Self-pay

## 2013-10-27 MED ORDER — BUDESONIDE 3 MG PO CP24: 9.0000 mg | ORAL_CAPSULE | Freq: Every day | ORAL | Status: DC

## 2013-10-27 NOTE — Telephone Encounter (Signed)
Will route to Dr. Christella HartiganJacobs for suggestions on alternate for budesonide

## 2013-10-27 NOTE — Telephone Encounter (Signed)
Pt left note; Dr Christella HartiganJacobs had prescribed Budesonide for colitis and cost to pt is over $300.00. Terri at [facility] said pt is in Donut hole.pt is wanting a substitute med and request that Dr Sharen HonesGutierrez call and talk with Dr Christella HartiganJacobs about possible substitute med that would be less expensive. Pt spoke with [employer] and was told alternate med would be fluticasone propionate; pt was told by pharmacist that med was for cold. Pt feeling depressed and not sleeping at night. No SI/HI. Pt wants appt to see Dr Sharen HonesGutierrez; pt scheduled appt 10/28/13 at 4 pm. If pt condition changes or worsens prior to appt pt will cb.

## 2013-10-28 ENCOUNTER — Encounter: Payer: Medicare Other | Admitting: Gastroenterology

## 2013-10-28 ENCOUNTER — Encounter: Payer: Self-pay | Admitting: Family Medicine

## 2013-10-28 ENCOUNTER — Ambulatory Visit (INDEPENDENT_AMBULATORY_CARE_PROVIDER_SITE_OTHER)
Admission: RE | Admit: 2013-10-28 | Discharge: 2013-10-28 | Disposition: A | Discharge status: Home / Self Care | Payer: Medicare Other | Source: Ambulatory Visit | Attending: Family Medicine | Admitting: Family Medicine

## 2013-10-28 ENCOUNTER — Ambulatory Visit (INDEPENDENT_AMBULATORY_CARE_PROVIDER_SITE_OTHER): Payer: Medicare Other | Admitting: Family Medicine

## 2013-10-28 VITALS — BP 118/60 | HR 56 | Temp 97.8°F | Wt 125.5 lb

## 2013-10-28 DIAGNOSIS — K52832 Lymphocytic colitis: Secondary | ICD-10-CM

## 2013-10-28 DIAGNOSIS — F418 Other specified anxiety disorders: Secondary | ICD-10-CM

## 2013-10-28 DIAGNOSIS — R0781 Pleurodynia: Secondary | ICD-10-CM

## 2013-10-28 DIAGNOSIS — I4819 Other persistent atrial fibrillation: Secondary | ICD-10-CM

## 2013-10-28 DIAGNOSIS — R0789 Other chest pain: Secondary | ICD-10-CM

## 2013-10-28 DIAGNOSIS — R634 Abnormal weight loss: Secondary | ICD-10-CM

## 2013-10-28 DIAGNOSIS — K5289 Other specified noninfective gastroenteritis and colitis: Secondary | ICD-10-CM

## 2013-10-28 DIAGNOSIS — I4891 Unspecified atrial fibrillation: Secondary | ICD-10-CM

## 2013-10-28 DIAGNOSIS — R079 Chest pain, unspecified: Secondary | ICD-10-CM

## 2013-10-28 DIAGNOSIS — F341 Dysthymic disorder: Secondary | ICD-10-CM

## 2013-10-28 HISTORY — DX: Lymphocytic colitis: K52.832

## 2013-10-28 MED ORDER — PREDNISONE 5 MG PO TABS: 10.0000 mg | ORAL_TABLET | Freq: Every day | ORAL | Status: DC

## 2013-10-28 NOTE — Telephone Encounter (Signed)
rx sent f/u made and pt will call ini a few weeks to report om symptoms, she will also d/c budesonide

## 2013-10-28 NOTE — Patient Instructions (Addendum)
Make sure you've stopped sertraline and started fluoxetine (at pharmacy). Start prednisone - this will make you feel better. Restart eliquis 1/2 tablet blood thinner. Restart lipitor (atorvastatin) for cholesterol. I think fluoxetine will help with mood. Xray of left ribcage to rule out fracture today.

## 2013-10-28 NOTE — Assessment & Plan Note (Signed)
After fall - check xray to r/o fracture.

## 2013-10-28 NOTE — Assessment & Plan Note (Addendum)
Sertraline started early 2015 - but with new dx lymphocytic colitis, will discontinue sertraline. Deteriorated mood off sertraline. Recommended start prozac daily.

## 2013-10-28 NOTE — Telephone Encounter (Signed)
Left message on machine to call back  

## 2013-10-28 NOTE — Assessment & Plan Note (Signed)
On prednisone per GI. Reviewed dx with patient.

## 2013-10-28 NOTE — Progress Notes (Addendum)
BP 118/60  Pulse 56  Temp(Src) 97.8 F (36.6 C) (Oral)  Wt 125 lb 8 oz (56.926 kg)   CC: f/u visit  Subjective:    Patient ID: [name], female    DOB: 09/03/1932, 80 y.o.   MRN: [id number]  HPI: [name] is a 80 y.o. female presenting on 10/28/2013 for Depression, Insomnia and Chest Pain   Recent colonoscopy showing colitis and biopsy consistent with lymphocytic colitis. Reviewed with patient. Started on prednisone per GI. Has not filled yet. Advised to fill right away.  Staying weak, fatigued. Continued weight loss noted. Staying depressed. Stopped sertraline as advised ~4d ago but did not start fluoxetine. Self stopped eliquis, atorvastatin and lisinopril. Has had several falls recently - hit L posterior chest wall with persistent pain - would like evaluated   Wt Readings from Last 3 Encounters:  10/28/13 125 lb 8 oz (56.926 kg)  10/16/13 131 lb (59.421 kg)  10/16/13 131 lb (59.421 kg)   Body mass index is 22.95 kg/(m^2).  Past Medical History  Diagnosis Date  . Dyslipidemia   . Depression with anxiety     celexa started 04/2010  . Hyperlipidemia   . Hypertension   . CAD (coronary artery disease)     95% LAD stenosis, Cypher x 02 September 1996  . Atrial fibrillation   . Prediabetes 2001  . Cervicalgia   . LBP (low back pain) 2005    spinal stenosis and bulging discs at L2-L5 s/p lumbar laminectomy [facility] Quint([place])  . Osteopenia 07/2013    T score -2.2 L femur  . Gout     prior PCP stopped allopurinol  . CHF (congestive heart failure) 2014    on recent hospitalization  . Arthritis   . Syncopal episodes 2015    orthostatic  . T12 compression fracture 2015    by CT  . Atherosclerosis of abdominal aorta   . Peripheral neuropathy since feb 2015    right arm      Past Medical History  Diagnosis Date  . Dyslipidemia   . Depression with anxiety     celexa started 04/2010  . Hyperlipidemia   . Hypertension   . CAD (coronary artery disease)     95% LAD  stenosis, Cypher x 02 September 1996  . Atrial fibrillation   . Prediabetes 2001  . Cervicalgia   . LBP (low back pain) 2005    spinal stenosis and bulging discs at L2-L5 s/p lumbar laminectomy Dutch Quint([place])  . Osteopenia 07/2013    T score -2.2 L femur  . Gout     prior PCP stopped allopurinol  . CHF (congestive heart failure) 2014    on recent hospitalization  . Arthritis   . Syncopal episodes 2015    orthostatic  . T12 compression fracture 2015    by CT  . Atherosclerosis of abdominal aorta   . Peripheral neuropathy since feb 2015    right arm      Relevant past medical, surgical, family and social history reviewed and updated as indicated.  Allergies and medications reviewed and updated. Current Outpatient Prescriptions on File Prior to Visit  Medication Sig  . allopurinol (ZYLOPRIM) 100 MG tablet Take 100 mg by mouth every morning.  Marland Kitchen. ALPRAZolam (XANAX) 0.5 MG tablet Take 0.5 mg by mouth at bedtime as needed for anxiety or sleep.  Marland Kitchen. atorvastatin (LIPITOR) 40 MG tablet Take 40 mg by mouth every evening.  . calcium carbonate (OS-CAL) 600 MG TABS tablet Take  600 mg by mouth daily with breakfast.  . Cholecalciferol (VITAMIN D3) 2000 UNITS capsule Take 2,000 Units by mouth every morning.  . digoxin (LANOXIN) 0.125 MG tablet Take 0.125 mg by mouth every morning.  Marland Kitchen FLUoxetine (PROZAC) 20 MG tablet Take 1 tablet (20 mg total) by mouth daily.  . hydrochlorothiazide (HYDRODIURIL) 25 MG tablet Take 25 mg by mouth every morning.  Marland Kitchen HYDROcodone-acetaminophen (NORCO) 10-325 MG per tablet Take 1 tablet by mouth 2 (two) times daily as needed for moderate pain.   Marland Kitchen lisinopril (PRINIVIL,ZESTRIL) 5 MG tablet Take 5 mg by mouth every morning.  . metoprolol (LOPRESSOR) 50 MG tablet Take 50 mg by mouth 2 (two) times daily.  . Multiple Vitamin (MULTIVITAMIN WITH MINERALS) TABS tablet Take 1 tablet by mouth every morning.  . nitroGLYCERIN (NITROSTAT) 0.4 MG SL tablet Place 0.4 mg under the tongue every 5  (five) minutes as needed for chest pain.  . potassium chloride (K-DUR,KLOR-CON) 10 MEQ tablet Take 20 mEq by mouth 2 (two) times daily.  Marland Kitchen apixaban (ELIQUIS) 2.5 MG TABS tablet Take 2.5 mg by mouth 2 (two) times daily.  . predniSONE (DELTASONE) 5 MG tablet Take 2 tablets (10 mg total) by mouth daily with breakfast.   No current facility-administered medications on file prior to visit.    Review of Systems Per HPI unless specifically indicated above    Objective:    BP 118/60  Pulse 56  Temp(Src) 97.8 F (36.6 C) (Oral)  Wt 125 lb 8 oz (56.926 kg)  Physical Exam  Nursing note and vitals reviewed. Constitutional: She appears well-developed and well-nourished. No distress.  HENT:  Mouth/Throat: Oropharynx is clear and moist. No oropharyngeal exudate.  Cardiovascular: Normal rate, normal heart sounds and intact distal pulses.  An irregularly irregular rhythm present.  No murmur heard. Pulmonary/Chest: Effort normal and breath sounds normal. No respiratory distress. She has no decreased breath sounds. She has no wheezes. She has no rales. She exhibits tenderness and bony tenderness (left lateral lower ribcage pain, some subluxation of rib noted).  Musculoskeletal: She exhibits no edema.  Skin: Rash noted.  Diffuse maculopapular rash throughout body       Assessment & Plan:   Problem List Items Addressed This Visit   Atrial fibrillation     Persistent afib. Pt had self stopped eliquis. rec restart 2.5mg  bid.    Depression with anxiety     Sertraline started early 2015 - but with new dx lymphocytic colitis, will discontinue sertraline. Deteriorated mood off sertraline. Recommended start prozac daily.    Loss of weight     Anticipate recent colitis contributing - will re evaluate once steroid has been started.    Rib pain on left side - Primary     After fall - check xray to r/o fracture.    Relevant Orders      DG Ribs Unilateral Left (Completed)   Lymphocytic colitis      On prednisone per GI. Reviewed dx with patient.        Follow up plan: Return if symptoms worsen or fail to improve.

## 2013-10-28 NOTE — Assessment & Plan Note (Signed)
Anticipate recent colitis contributing - will re evaluate once steroid has been started.

## 2013-10-28 NOTE — Telephone Encounter (Signed)
Dr. Sharen HonesGutierrez, Prednisone is an adequate alternative and much cheaper.  We will contact her about new prescription.  Patty, Can you get in touch with her.  D/c the budesonide, start prednisone at 5mg  pills, two pills once daily.  Disp 60 pills, 4 refills.  She should call here in 2-3 weeks to report on her response.  ROV with me in 5-6 weeks without tapering before then.  Thanks

## 2013-10-28 NOTE — Progress Notes (Signed)
Pre visit review using our clinic review tool, if applicable. No additional management support is needed unless otherwise documented below in the visit note. 

## 2013-10-28 NOTE — Assessment & Plan Note (Signed)
Persistent afib. Pt had self stopped eliquis. rec restart 2.5mg  bid.

## 2013-10-30 ENCOUNTER — Telehealth: Payer: Self-pay | Admitting: Family Medicine

## 2013-10-30 NOTE — Telephone Encounter (Signed)
Spoke with patient.

## 2013-10-30 NOTE — Telephone Encounter (Signed)
Pt called and would like to get results of x-rays.  Best number to reach patient [phone].  She is not available from 2:45-3:30 today but would like a call today.

## 2013-11-05 ENCOUNTER — Telehealth: Payer: Self-pay | Admitting: Gastroenterology

## 2013-11-05 NOTE — Telephone Encounter (Signed)
No answer and no voice mail to leave message I will wait for further communication from the pt

## 2013-11-05 NOTE — Telephone Encounter (Signed)
Pt aware and will call back with an update in 4-5 weeks

## 2013-11-05 NOTE — Telephone Encounter (Signed)
She should double the prednisone (to 20mg  daily).  Can also start one imodium twice daily (scheduled, not PRN).  SHe needs to call back in 4-5 days to report on her progress.

## 2013-11-05 NOTE — Telephone Encounter (Signed)
Dr Christella HartiganJacobs the pt called to state she is still having 12 or more diarrhea stools daily. She did not start budesonide because of cost and has not been taking imodium.  She is on prednisone 10 mg daily but says that is not helping. Please advise

## 2013-11-12 ENCOUNTER — Other Ambulatory Visit: Payer: Self-pay | Admitting: Family Medicine

## 2013-11-12 NOTE — Telephone Encounter (Signed)
plz phone in. 

## 2013-11-13 NOTE — Telephone Encounter (Signed)
Rx called in as directed.   

## 2013-12-01 ENCOUNTER — Telehealth: Payer: Self-pay | Admitting: Family Medicine

## 2013-12-10 ENCOUNTER — Other Ambulatory Visit: Payer: Self-pay | Admitting: Family Medicine

## 2013-12-10 NOTE — Telephone Encounter (Signed)
Ok to refill 

## 2013-12-10 NOTE — Telephone Encounter (Signed)
plz phone in. 

## 2013-12-11 NOTE — Telephone Encounter (Signed)
Rx called in as directed.   

## 2013-12-22 ENCOUNTER — Ambulatory Visit: Payer: Medicare Other | Admitting: Family Medicine

## 2013-12-26 NOTE — Telephone Encounter (Signed)
error 

## 2014-01-01 ENCOUNTER — Ambulatory Visit (INDEPENDENT_AMBULATORY_CARE_PROVIDER_SITE_OTHER): Payer: Medicare Other | Admitting: Family Medicine

## 2014-01-01 ENCOUNTER — Encounter: Payer: Self-pay | Admitting: Family Medicine

## 2014-01-01 ENCOUNTER — Encounter: Payer: Self-pay | Admitting: *Deleted

## 2014-01-01 VITALS — BP 106/58 | HR 80 | Temp 97.7°F | Wt 122.2 lb

## 2014-01-01 DIAGNOSIS — E039 Hypothyroidism, unspecified: Secondary | ICD-10-CM

## 2014-01-01 DIAGNOSIS — K52832 Lymphocytic colitis: Secondary | ICD-10-CM

## 2014-01-01 DIAGNOSIS — R7309 Other abnormal glucose: Secondary | ICD-10-CM

## 2014-01-01 DIAGNOSIS — I4819 Other persistent atrial fibrillation: Secondary | ICD-10-CM

## 2014-01-01 DIAGNOSIS — I10 Essential (primary) hypertension: Secondary | ICD-10-CM

## 2014-01-01 DIAGNOSIS — R7303 Prediabetes: Secondary | ICD-10-CM

## 2014-01-01 DIAGNOSIS — R55 Syncope and collapse: Secondary | ICD-10-CM

## 2014-01-01 DIAGNOSIS — R634 Abnormal weight loss: Secondary | ICD-10-CM

## 2014-01-01 DIAGNOSIS — K5289 Other specified noninfective gastroenteritis and colitis: Secondary | ICD-10-CM

## 2014-01-01 DIAGNOSIS — I481 Persistent atrial fibrillation: Secondary | ICD-10-CM

## 2014-01-01 LAB — COMPREHENSIVE METABOLIC PANEL
ALBUMIN: 4.1 g/dL (ref 3.5–5.2)
ALT: 20 U/L (ref 0–35)
AST: 25 U/L (ref 0–37)
Alkaline Phosphatase: 84 U/L (ref 39–117)
BUN: 15 mg/dL (ref 6–23)
CALCIUM: 9.4 mg/dL (ref 8.4–10.5)
CO2: 26 mEq/L (ref 19–32)
Chloride: 94 mEq/L — ABNORMAL LOW (ref 96–112)
Creatinine, Ser: 1.3 mg/dL — ABNORMAL HIGH (ref 0.4–1.2)
GFR: 41.2 mL/min — ABNORMAL LOW (ref >60.00)
Glucose, Bld: 97 mg/dL (ref 70–99)
POTASSIUM: 3.5 meq/L (ref 3.5–5.1)
SODIUM: 130 meq/L — AB (ref 135–145)
TOTAL PROTEIN: 7.3 g/dL (ref 6.0–8.3)
Total Bilirubin: 1.3 mg/dL — ABNORMAL HIGH (ref 0.2–1.2)

## 2014-01-01 LAB — CBC WITH DIFFERENTIAL/PLATELET
BASOS PCT: 0.6 % (ref 0.0–3.0)
Basophils Absolute: 0.1 10*3/uL (ref 0.0–0.1)
EOS ABS: 0.7 10*3/uL (ref 0.0–0.7)
Eosinophils Relative: 7.4 % — ABNORMAL HIGH (ref 0.0–5.0)
HCT: 38.8 % (ref 36.0–46.0)
Hemoglobin: 13 g/dL (ref 12.0–15.0)
Lymphocytes Relative: 19.1 % (ref 12.0–46.0)
Lymphs Abs: 1.9 10*3/uL (ref 0.7–4.0)
MCHC: 33.5 g/dL (ref 30.0–36.0)
MCV: 96.8 fl (ref 78.0–100.0)
Monocytes Absolute: 0.8 10*3/uL (ref 0.1–1.0)
Monocytes Relative: 7.8 % (ref 3.0–12.0)
NEUTROS PCT: 65.1 % (ref 43.0–77.0)
Neutro Abs: 6.4 10*3/uL (ref 1.4–7.7)
PLATELETS: 248 10*3/uL (ref 150.0–400.0)
RBC: 4.01 Mil/uL (ref 3.87–5.11)
RDW: 14.8 % (ref 11.5–15.5)
WBC: 9.8 10*3/uL (ref 4.0–10.5)

## 2014-01-01 LAB — T4, FREE: Free T4: 1.01 ng/dL (ref 0.60–1.60)

## 2014-01-01 LAB — HEMOGLOBIN A1C: HEMOGLOBIN A1C: 6.6 % — AB (ref 4.6–6.5)

## 2014-01-01 LAB — TSH: TSH: 0.89 u[IU]/mL (ref 0.35–4.50)

## 2014-01-01 MED ORDER — HYDROCHLOROTHIAZIDE 12.5 MG PO TABS: 12.5000 mg | ORAL_TABLET | Freq: Every morning | ORAL | Status: DC

## 2014-01-01 NOTE — Assessment & Plan Note (Signed)
Check TSH today

## 2014-01-01 NOTE — Assessment & Plan Note (Signed)
Somewhat low today - will decrease hctz to 12.5mg  daily and may decrease potassium based on labs today.
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
## 2014-01-01 NOTE — Assessment & Plan Note (Signed)
Check digoxin level today. Reports compliance with eliquis.
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
## 2014-01-01 NOTE — Assessment & Plan Note (Signed)
Not improved despite prednisone. Reports compliance with prednisone daily. I encouraged she schedule f/u appt with GI.

## 2014-01-01 NOTE — Progress Notes (Signed)
Pre visit review using our clinic review tool, if applicable. No additional management support is needed unless otherwise documented below in the visit note. 

## 2014-01-01 NOTE — Patient Instructions (Addendum)
Update controlled substance agreement today. Schedule follow up appointment with GI Dr. Christella HartiganJacobs. Blood work today. Pass by Marion's office for head CT and echocardiogram. I'd also like her to help set you up with follow up appointments with Dr. Antoine PocheHochrein and Christella HartiganJacobs. Decrease hydrochlorothiazide to 12.5mg  daily - may take 1/2 tablet until you run out then new lower dose will be at your pharmacy.

## 2014-01-02 ENCOUNTER — Telehealth: Payer: Self-pay | Admitting: Family Medicine

## 2014-01-02 ENCOUNTER — Ambulatory Visit (INDEPENDENT_AMBULATORY_CARE_PROVIDER_SITE_OTHER): Payer: Medicare Other | Admitting: Gastroenterology

## 2014-01-02 ENCOUNTER — Encounter: Payer: Self-pay | Admitting: Gastroenterology

## 2014-01-02 VITALS — BP 126/62 | HR 68 | Ht 62.0 in | Wt 122.8 lb

## 2014-01-02 DIAGNOSIS — K5289 Other specified noninfective gastroenteritis and colitis: Secondary | ICD-10-CM

## 2014-01-02 DIAGNOSIS — K52832 Lymphocytic colitis: Secondary | ICD-10-CM

## 2014-01-02 LAB — DIGOXIN LEVEL: Digoxin Level: 0.6 ng/mL — ABNORMAL LOW (ref 0.8–2.0)

## 2014-01-02 MED ORDER — MESALAMINE 800 MG PO TBEC: 1600.0000 mg | DELAYED_RELEASE_TABLET | Freq: Three times a day (TID) | ORAL | Status: DC
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
## 2014-01-02 NOTE — Telephone Encounter (Signed)
emmi mailed  °

## 2014-01-02 NOTE — Patient Instructions (Addendum)
STay on prednisone at (currently taking 5mg  pills, take two pills every morning). Do not taper this medicine. Start asacol, 2 pills three times daily. Please return to see Dr. Christella HartiganJacobs (or extender) in 4-5 weeks. Your follow up appointment is scheduled with Willette ClusterPaula Guenther, NP on 01/30/14 at 8:30am. If you need to reschedule or cancel please call [phone].

## 2014-01-05 ENCOUNTER — Ambulatory Visit: Payer: Medicare Other | Admitting: Gastroenterology

## 2014-01-05 ENCOUNTER — Ambulatory Visit (INDEPENDENT_AMBULATORY_CARE_PROVIDER_SITE_OTHER)
Admission: RE | Admit: 2014-01-05 | Discharge: 2014-01-05 | Disposition: A | Discharge status: Home / Self Care | Payer: Medicare Other | Source: Ambulatory Visit | Attending: Family Medicine | Admitting: Family Medicine

## 2014-01-05 DIAGNOSIS — I481 Persistent atrial fibrillation: Secondary | ICD-10-CM

## 2014-01-05 DIAGNOSIS — R55 Syncope and collapse: Secondary | ICD-10-CM

## 2014-01-05 DIAGNOSIS — I4819 Other persistent atrial fibrillation: Secondary | ICD-10-CM

## 2014-01-06 ENCOUNTER — Other Ambulatory Visit: Payer: Self-pay | Admitting: Cardiology

## 2014-01-12 ENCOUNTER — Telehealth: Payer: Self-pay | Admitting: Cardiology

## 2014-01-12 NOTE — Telephone Encounter (Signed)
New message      Pt is having an echo tomorrow and is scheduled to see Dr Antoine PocheHochrein on 10-30.  She want to know if her echo is normal what is the game plan?

## 2014-01-13 ENCOUNTER — Other Ambulatory Visit: Payer: Self-pay

## 2014-01-13 ENCOUNTER — Other Ambulatory Visit (INDEPENDENT_AMBULATORY_CARE_PROVIDER_SITE_OTHER): Payer: Medicare Other

## 2014-01-13 DIAGNOSIS — R55 Syncope and collapse: Secondary | ICD-10-CM

## 2014-01-13 DIAGNOSIS — I481 Persistent atrial fibrillation: Secondary | ICD-10-CM

## 2014-01-15 ENCOUNTER — Other Ambulatory Visit: Payer: Self-pay | Admitting: Cardiology

## 2014-01-15 ENCOUNTER — Telehealth: Payer: Self-pay | Admitting: Cardiology

## 2014-01-15 ENCOUNTER — Other Ambulatory Visit: Payer: Self-pay | Admitting: Family Medicine

## 2014-01-15 NOTE — Telephone Encounter (Signed)
Called back no answer, LMTCB

## 2014-01-15 NOTE — Telephone Encounter (Signed)
Attempted to call pt, unsuccessful and unable to leave a message

## 2014-01-15 NOTE — Telephone Encounter (Signed)
plz phone in. 

## 2014-01-15 NOTE — Telephone Encounter (Signed)
Pt was returning JC's call  Thanks

## 2014-01-15 NOTE — Telephone Encounter (Signed)
Ok to refill 

## 2014-01-15 NOTE — Telephone Encounter (Signed)
Rx called in as directed.   

## 2014-01-18 ENCOUNTER — Other Ambulatory Visit: Payer: Self-pay | Admitting: Family Medicine

## 2014-01-18 MED ORDER — METOPROLOL TARTRATE 25 MG PO TABS: 25.0000 mg | ORAL_TABLET | Freq: Two times a day (BID) | ORAL | Status: DC

## 2014-01-19 ENCOUNTER — Encounter: Payer: Self-pay | Admitting: Family Medicine

## 2014-01-30 ENCOUNTER — Ambulatory Visit (INDEPENDENT_AMBULATORY_CARE_PROVIDER_SITE_OTHER): Payer: Medicare Other | Admitting: Cardiology

## 2014-01-30 ENCOUNTER — Ambulatory Visit: Payer: Medicare Other | Admitting: Nurse Practitioner

## 2014-01-30 ENCOUNTER — Other Ambulatory Visit: Payer: Self-pay | Admitting: Family Medicine

## 2014-01-30 ENCOUNTER — Encounter: Payer: Self-pay | Admitting: Cardiology

## 2014-01-30 VITALS — BP 124/68 | HR 106 | Ht 65.0 in | Wt 123.7 lb

## 2014-01-30 DIAGNOSIS — I4891 Unspecified atrial fibrillation: Secondary | ICD-10-CM

## 2014-01-30 DIAGNOSIS — B029 Zoster without complications: Secondary | ICD-10-CM

## 2014-01-30 MED ORDER — VALACYCLOVIR HCL 1 G PO TABS: 1000.0000 mg | ORAL_TABLET | Freq: Three times a day (TID) | ORAL | Status: DC

## 2014-01-30 NOTE — Patient Instructions (Signed)
Your physician recommends that you schedule a follow-up appointment in: 6 months with Dr. Antoine PocheHochrein  Take 1000 mg of Valtrex three time a day for 7 days
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
## 2014-02-02 ENCOUNTER — Ambulatory Visit: Payer: Medicare Other | Admitting: Cardiology

## 2014-02-02 ENCOUNTER — Ambulatory Visit: Payer: Medicare Other | Admitting: Family Medicine

## 2014-02-03 ENCOUNTER — Telehealth: Payer: Self-pay | Admitting: Family Medicine

## 2014-02-03 NOTE — Telephone Encounter (Signed)
Spoke with patient and she just wanted you to be aware that she was started on valtrex for shingles and will get PNA vaccine when everything is cleared up.

## 2014-02-03 NOTE — Telephone Encounter (Signed)
Pt has some questions regarding the shingles medication that she is taking (Valtrex) and about when she can get her PNA inj. She is requesting a call back. She wanted to make it clear that it was not an emergency but wanted to ask someone about this when they had a few minutes.

## 2014-02-03 NOTE — Telephone Encounter (Signed)
Noted. Thanks.

## 2014-02-05 ENCOUNTER — Other Ambulatory Visit: Payer: Self-pay | Admitting: Family Medicine

## 2014-02-05 NOTE — Telephone Encounter (Signed)
plz check with patient - how is shingles rash? Should be improving and likely doesn't need another valtrex course. To complete 7 d course.

## 2014-02-05 NOTE — Telephone Encounter (Signed)
Electronic Rx request for Valtrex received. Rx prescribed by Dr. Antoine PocheHochrein on 01/30/14. Patient's last office visit with PCP 01/01/14. Please advise.

## 2014-02-06 NOTE — Telephone Encounter (Signed)
She said the rash isn't much better and is still painful. She asks if she should extend course or if she should give this some time.

## 2014-02-08 NOTE — Telephone Encounter (Signed)
If rash is drying out doesn't need course extended. If new spots spreading plz send in another 7 days.

## 2014-02-08 NOTE — Telephone Encounter (Signed)
I'll defer to PCP, but extending the antiviral usually isn't helpful.

## 2014-02-09 NOTE — Telephone Encounter (Signed)
Attempted to call patient. No answer, no machine. Will try again later.  

## 2014-02-11 NOTE — Telephone Encounter (Signed)
Spoke with patient and she said the rash is drying up but is still itchy. Rx not refilled.

## 2014-03-03 ENCOUNTER — Telehealth: Payer: Self-pay | Admitting: *Deleted

## 2014-03-03 NOTE — Telephone Encounter (Signed)
Patient walked in to schedule an appt because her pain dr told her to. She had passed out and hit her head/face causing a black eye and knot on her forehead. When I spoke with patient, she couldn't recall the exact day she passed out, but said it was last week and she couldn't recall what precipitated it other than she was walking down the hall and that was all she remembered. Spoke with Dr. Reece AgarG who advised that since it had happened last week, that it was okay to wait to see her until next available appt unless she wanted to be seen today, in which case [facility]. Advised patient and she said she would wait to see him because she didn't feel that anything was urgent. She said she is no longer driving and said her son drove her today. I advised that if she passed out again between now and her appt or if anything changed, then [facility]. She verbalized understanding.

## 2014-03-04 NOTE — Telephone Encounter (Signed)
Will see thursday

## 2014-03-05 ENCOUNTER — Encounter: Payer: Self-pay | Admitting: Family Medicine

## 2014-03-05 ENCOUNTER — Ambulatory Visit (INDEPENDENT_AMBULATORY_CARE_PROVIDER_SITE_OTHER): Payer: Medicare Other | Admitting: Family Medicine

## 2014-03-05 VITALS — BP 126/84 | HR 100 | Temp 98.1°F | Wt 125.2 lb

## 2014-03-05 DIAGNOSIS — K5289 Other specified noninfective gastroenteritis and colitis: Secondary | ICD-10-CM

## 2014-03-05 DIAGNOSIS — R55 Syncope and collapse: Secondary | ICD-10-CM

## 2014-03-05 DIAGNOSIS — I481 Persistent atrial fibrillation: Secondary | ICD-10-CM

## 2014-03-05 DIAGNOSIS — R634 Abnormal weight loss: Secondary | ICD-10-CM

## 2014-03-05 DIAGNOSIS — I5032 Chronic diastolic (congestive) heart failure: Secondary | ICD-10-CM | POA: Insufficient documentation

## 2014-03-05 DIAGNOSIS — I10 Essential (primary) hypertension: Secondary | ICD-10-CM

## 2014-03-05 DIAGNOSIS — K52832 Lymphocytic colitis: Secondary | ICD-10-CM

## 2014-03-05 DIAGNOSIS — I4819 Other persistent atrial fibrillation: Secondary | ICD-10-CM

## 2014-03-05 DIAGNOSIS — F418 Other specified anxiety disorders: Secondary | ICD-10-CM

## 2014-03-05 NOTE — Assessment & Plan Note (Signed)
Improved on prozac 20mg .

## 2014-03-05 NOTE — Patient Instructions (Addendum)
Let's stop water pill - hydrochlorothiazide and may decrease potassium pill to 20mEq once in the morning Increase metoprolol to 50mg  twice daily. Continue lisinopril 5mg  daily. Continue eliquis. Orthostatic blood pressures today. I'd like to refer you to neurology to further evaluate these these falls and passing out episodes. Pass by Marion's office to schedule this.

## 2014-03-05 NOTE — Assessment & Plan Note (Signed)
BP better recently (no longer low) with recent changes but she is becoming tachycardic. Orthostatic vital signs positive on exam today (supine 160/84, HR 88; standing 126/84, HR 100) - will stop HCTZ, decrease kdur to 20mEq daily. Will increase metoprolol to 50mg  bid. Pt agrees with plan.

## 2014-03-05 NOTE — Progress Notes (Signed)
Pre visit review using our clinic review tool, if applicable. No additional management support is needed unless otherwise documented below in the visit note. 

## 2014-03-05 NOTE — Assessment & Plan Note (Signed)
Diarrhea resolving. Pt currently off mesalamine.

## 2014-03-05 NOTE — Assessment & Plan Note (Signed)
EF 45-50% on recent echo 01/2014. Stop HCTZ, continue lisinopril 5mg  and increase metoprolol to 50mg  bid. Continue digoxin.

## 2014-03-05 NOTE — Assessment & Plan Note (Signed)
Workup overall unrevealing. Recurrent episode last week. ?transient global amnesia. Discussed with patient referral back to neurology - she agrees. Will try and set her up with [facility] as pt requests [place]. Continue to abstain from driving.

## 2014-03-05 NOTE — Assessment & Plan Note (Signed)
Continue eliquis  ?

## 2014-03-05 NOTE — Progress Notes (Addendum)
BP 130/70 mmHg  Pulse 100  Temp(Src) 98.1 F (36.7 C) (Oral)  Wt 125 lb 4 oz (56.813 kg)   CC: recurrent syncope  Subjective:    Patient ID: [name], female    DOB: 12/18/1932, 80 y.o.   MRN: [id number]  HPI: [name] is a 80 y.o. female presenting on 03/05/2014 for Loss of Consciousness   See prior note for details.  Recurrent syncopal spells of unclear etiology, possible transient global amnesia. Workup included stable labs, echo, EKG, and CT head. Neurology referral was placed 01/01/2014 but it looks like this was accidentally cancelled prior to scheduling so pt never received call to schedule. Cardiology saw her 01/30/2014 and agreed with neurology referral. We also tried decreasing blood pressure med dose to see if possible orthostatic etiology. This helped for a while, but now she has had another episode of loss of consciousness.   Fall occurred 02/27/2014. Pt does not remember this episode, doesn't remember actually falling. Denies premonitory sxs. Woke up with R black eye. Bruising has improved but staying sore at L temple region. Pain doctor (Dr Vear ClockPhillips) thinks she may have had small seizures and made her promise she would come see us.   Pt is not driving herself anymore (since last visit).  Weight has stabilized - 3 lb weight gain since last visit. Appetite has improved.  However endorses persistent night sweats. Mood has significantly improved since starting prozac 20mg  daily. zoloft may have contributed to lymphocytic colitis.  H/o CHF, last echo 01/2014 - EF 45-50%. Endorses mild leg swelling. Lymphocytic colitis - diarrhea has improved. Anxiety - takes alprazolam mainly at night time. Atrial fibrillation - continue eliquis and rate control. Recently with shingles treated completed valtrex course (01/30/2014).  Relevant past medical, surgical, family and social history reviewed and updated as indicated. Interim medical history since our last visit  reviewed. Allergies and medications reviewed and updated.  Past Medical History  Diagnosis Date  . Dyslipidemia   . Depression with anxiety     celexa started 04/2010  . Hyperlipidemia   . Hypertension   . CAD (coronary artery disease)     95% LAD stenosis, [place] x 02 September 1996  . Atrial fibrillation   . Prediabetes 2001  . Cervicalgia   . LBP (low back pain) 2005    spinal stenosis and bulging discs at L2-L5 s/p lumbar laminectomy Dutch Quint([place])  . Osteopenia 07/2013    T score -2.2 L femur  . Gout     prior PCP stopped allopurinol  . CHF (congestive heart failure) 2014    on recent hospitalization  . Arthritis   . Syncopal episodes 2015    orthostatic  . T12 compression fracture 2015    by CT  . Atherosclerosis of abdominal aorta   . Peripheral neuropathy since feb 2015    right arm   . Lymphocytic colitis 10/28/2013    Past Surgical History  Procedure Laterality Date  . Lumbar laminectomy  2005    Dr. Dutch QuintPoole  . Tonsillectomy    . Cardiac catheterization    . Percutaneous coronary stent intervention (pci-s)      stent x3 in heart  . Cataract extraction Bilateral   . Dexa  07/2013    T score -2.2 L femur  . Colonoscopy with propofol N/A 10/16/2013    Rachael Feeaniel P Jacobs, MD    Current Outpatient Prescriptions on File Prior to Visit  Medication Sig  . allopurinol (ZYLOPRIM) 100 MG tablet TAKE  ONE TABLET EVERY DAY  . ALPRAZolam (XANAX) 0.25 MG tablet TAKE ONE TABLET BY MOUTH TWICE DAILY AS NEEDED  . atorvastatin (LIPITOR) 40 MG tablet Take 40 mg by mouth every evening.  . calcium carbonate (OS-CAL) 600 MG TABS tablet Take 600 mg by mouth daily with breakfast.  . Cholecalciferol (VITAMIN D3) 2000 UNITS capsule Take 2,000 Units by mouth every morning.  . digoxin (LANOXIN) 0.125 MG tablet Take 0.125 mg by mouth every morning.  Marland Kitchen FLUoxetine (PROZAC) 20 MG tablet Take 1 tablet (20 mg total) by mouth daily.  Marland Kitchen lisinopril (PRINIVIL,ZESTRIL) 5 MG tablet Take 5 mg by mouth every  morning.  . metoprolol (LOPRESSOR) 25 MG tablet Take 1 tablet (25 mg total) by mouth 2 (two) times daily.  . Multiple Vitamin (MULTIVITAMIN WITH MINERALS) TABS tablet Take 1 tablet by mouth every morning.  . Oxycodone HCl 10 MG TABS   . potassium chloride (K-DUR,KLOR-CON) 10 MEQ tablet Take 20 mEq by mouth daily.   Marland Kitchen apixaban (ELIQUIS) 2.5 MG TABS tablet Take 2.5 mg by mouth 2 (two) times daily.  Marland Kitchen HYDROcodone-acetaminophen (NORCO) 10-325 MG per tablet Take 1 tablet by mouth 2 (two) times daily as needed for moderate pain.   . Mesalamine 800 MG TBEC Take 2 tablets (1,600 mg total) by mouth 3 (three) times daily. (Patient not taking: Reported on 03/05/2014)  . NITROSTAT 0.4 MG SL tablet PLACE 1 TABLET UNDER TONGUE EVERY 5 MIN AS NEEDED FOR CHEST PAIN IF NO RELIEF IN15 MIN CALL 911 (MAX 3 TABS) (Patient not taking: Reported on 03/05/2014)  . predniSONE (DELTASONE) 5 MG tablet Take 2 tablets (10 mg total) by mouth daily with breakfast. (Patient not taking: Reported on 03/05/2014)   No current facility-administered medications on file prior to visit.    Review of Systems  Per HPI unless specifically indicated above     Objective:    BP 130/70 mmHg  Pulse 100  Temp(Src) 98.1 F (36.7 C) (Oral)  Wt 125 lb 4 oz (56.813 kg)  Wt Readings from Last 3 Encounters:  03/05/14 125 lb 4 oz (56.813 kg)  01/30/14 123 lb 11.2 oz (56.11 kg)  01/02/14 122 lb 12.8 oz (55.702 kg)    Physical Exam  Constitutional: She appears well-developed and well-nourished. No distress.  HENT:  Mouth/Throat: Oropharynx is clear and moist. No oropharyngeal exudate.  Resolving bruising R temple without stepoff. Mild discomfort but not point tender to palpation. No goose egg or lumps noted  Eyes: Conjunctivae and EOM are normal. Pupils are equal, round, and reactive to light. No scleral icterus.  Cardiovascular: Normal rate, normal heart sounds and intact distal pulses.  An irregularly irregular rhythm present.  No murmur  heard. Pulmonary/Chest: Effort normal and breath sounds normal. No respiratory distress. She has no wheezes. She has no rales.  Musculoskeletal: She exhibits no edema.  Skin: Skin is warm and dry.  Nursing note and vitals reviewed.  orthostatics today:    Assessment & Plan:   Problem List Items Addressed This Visit    Syncope - Primary    Workup overall unrevealing. Recurrent episode last week. ?transient global amnesia. Discussed with patient referral back to neurology - she agrees. Will try and set her up with [facility] as pt requests [place]. Continue to abstain from driving.    Relevant Orders      Ambulatory referral to Neurology   Lymphocytic colitis    Diarrhea resolving. Pt currently off mesalamine.    Loss of weight  Actual weight gain noted. Pt endorses appetite improving as well.    Essential hypertension, benign    BP better recently (no longer low) with recent changes but she is becoming tachycardic. Orthostatic vital signs positive on exam today (supine 160/84, HR 88; standing 126/84, HR 100) - will stop HCTZ, decrease kdur to 20mEq daily. Will increase metoprolol to 50mg  bid. Pt agrees with plan.    Depression with anxiety    Improved on prozac 20mg .    CHF (congestive heart failure)    EF 45-50% on recent echo 01/2014. Stop HCTZ, continue lisinopril 5mg  and increase metoprolol to 50mg  bid. Continue digoxin.    Atrial fibrillation    Continue eliquis.        Follow up plan: Return if symptoms worsen or fail to improve.

## 2014-03-05 NOTE — Assessment & Plan Note (Signed)
Actual weight gain noted. Pt endorses appetite improving as well.

## 2014-03-09 ENCOUNTER — Telehealth: Payer: Self-pay | Admitting: Family Medicine

## 2014-03-09 ENCOUNTER — Telehealth: Payer: Self-pay | Admitting: Cardiology

## 2014-03-09 NOTE — Telephone Encounter (Signed)
Please call,she says she need to talk to.Pleas call as soon as you can,she has another doctor appt this afternoon.

## 2014-03-09 NOTE — Telephone Encounter (Signed)
Would have her see if cardiology has samples otherwise other option is coumadin.

## 2014-03-09 NOTE — Telephone Encounter (Signed)
Message left notifying patient.

## 2014-03-09 NOTE — Telephone Encounter (Signed)
Pt called stated Eliquis is to expensive and she cannot afford it. Insurance doesn't cover it, states it's $200. Please advise.

## 2014-03-09 NOTE — Telephone Encounter (Signed)
Pt. Called back no answer LMTCB 

## 2014-03-13 ENCOUNTER — Telehealth: Payer: Self-pay | Admitting: Cardiology

## 2014-03-13 NOTE — Telephone Encounter (Signed)
New message     Pt was seen in office on 12-7 for syncope and altered mental status.  Pt has afib. Pt was started on eliquis but cannot afford it.  She is not taking it.  Dr Malvin JohnsPotter want to make sure pt is started on a blood thinner because she may be having TIA's.  They are faxing over notes

## 2014-03-16 NOTE — Telephone Encounter (Signed)
Dr. Malvin JohnsPotter thinks pt is having tia's and she is not taking her eliquis because she cannot afford it, whats plan B

## 2014-03-23 NOTE — Telephone Encounter (Signed)
She can come to Coumadin clinic and talk about the alternatives.  She was already on warfarin in the past.  Call Ms. Maple with the results and send results to Eustaquio BoydenJavier Gutierrez, MD

## 2014-03-24 NOTE — Telephone Encounter (Signed)
Schedule this pt. With Haig ProphetKristan per Haig ProphetKristan and Dr. Antoine PocheHochrein  For a 30 min appt. To go over her options about her blood thinner

## 2014-03-24 NOTE — Telephone Encounter (Signed)
Dr. Antoine PocheHochrein wants this pt. To come to the coumadin clinic and talk to you about alternatives

## 2014-03-24 NOTE — Telephone Encounter (Signed)
Schedule her an appointment with me for office visit 30 minutes

## 2014-03-25 ENCOUNTER — Telehealth: Payer: Self-pay | Admitting: Cardiology

## 2014-03-25 NOTE — Telephone Encounter (Signed)
Pt called in and cancelled her appt for 12/28, I called her to clarify reason. She was initially cheerful and stated she did not want to take the Eliquis due to cost, but did not want to keep the consult appt with Belenda CruiseKristin because she had no interest in going on another blood thinner. There was a point of confusion because someone at her drug store stated she was not being prescribed any anticoagulants, but patient acknolwedged being on warfarin in the past and until recently taking Eliquis as prescribed.  She reports that she is scheduled for a visit with a neurologist and wants her recent TIA symptoms to be evaluated by them. When I asked if I could reschedule her pharm consult she strongly refused and stated she will not go on any anticoag now. She states she will follow up with Dr. Antoine PocheHochrein in April and if he recommends then, she will take something as prescribed.  Patient hung up before I could offer samples or alternatives.

## 2014-03-25 NOTE — Telephone Encounter (Signed)
Pt called in stating that she is not interested in taking any blood thinners

## 2014-03-30 ENCOUNTER — Ambulatory Visit: Payer: Medicare Other | Admitting: Pharmacist Clinician (PhC)/ Clinical Pharmacy Specialist

## 2014-04-07 ENCOUNTER — Encounter: Payer: Self-pay | Admitting: Internal Medicine

## 2014-04-07 ENCOUNTER — Other Ambulatory Visit: Payer: Self-pay | Admitting: Cardiology

## 2014-04-07 ENCOUNTER — Other Ambulatory Visit: Payer: Self-pay | Admitting: Family Medicine

## 2014-04-07 ENCOUNTER — Encounter: Payer: Self-pay | Admitting: Cardiology

## 2014-04-07 DIAGNOSIS — M4727 Other spondylosis with radiculopathy, lumbosacral region: Secondary | ICD-10-CM | POA: Diagnosis not present

## 2014-04-07 DIAGNOSIS — M47812 Spondylosis without myelopathy or radiculopathy, cervical region: Secondary | ICD-10-CM | POA: Diagnosis not present

## 2014-04-07 DIAGNOSIS — M961 Postlaminectomy syndrome, not elsewhere classified: Secondary | ICD-10-CM | POA: Diagnosis not present

## 2014-04-07 DIAGNOSIS — G894 Chronic pain syndrome: Secondary | ICD-10-CM | POA: Diagnosis not present

## 2014-04-16 DIAGNOSIS — R6889 Other general symptoms and signs: Secondary | ICD-10-CM | POA: Diagnosis not present

## 2014-04-16 DIAGNOSIS — R55 Syncope and collapse: Secondary | ICD-10-CM | POA: Diagnosis not present

## 2014-04-22 ENCOUNTER — Telehealth: Payer: Self-pay | Admitting: Family Medicine

## 2014-04-22 NOTE — Telephone Encounter (Signed)
Pt called checking on her results from dr Malvin Johnspotter   If pt doesn't answer ok to leave message

## 2014-04-22 NOTE — Telephone Encounter (Signed)
Dr. Malvin JohnsPotter isn't in our office/system. We don't have results for that doctor. Called and left message advising patient that she will need to call Dr. Daisy BlossomPotter's office to obtain results.

## 2014-05-05 DIAGNOSIS — M47812 Spondylosis without myelopathy or radiculopathy, cervical region: Secondary | ICD-10-CM | POA: Diagnosis not present

## 2014-05-05 DIAGNOSIS — M961 Postlaminectomy syndrome, not elsewhere classified: Secondary | ICD-10-CM | POA: Diagnosis not present

## 2014-05-05 DIAGNOSIS — M4727 Other spondylosis with radiculopathy, lumbosacral region: Secondary | ICD-10-CM | POA: Diagnosis not present

## 2014-05-05 DIAGNOSIS — G894 Chronic pain syndrome: Secondary | ICD-10-CM | POA: Diagnosis not present

## 2014-05-11 ENCOUNTER — Other Ambulatory Visit: Payer: Self-pay | Admitting: Family Medicine

## 2014-05-11 NOTE — Telephone Encounter (Signed)
Plz phone in

## 2014-05-11 NOTE — Telephone Encounter (Signed)
Ok to refill 

## 2014-05-12 NOTE — Telephone Encounter (Signed)
Rx called in as directed.   

## 2014-05-25 ENCOUNTER — Other Ambulatory Visit: Payer: Self-pay | Admitting: Family Medicine

## 2014-05-26 ENCOUNTER — Ambulatory Visit (INDEPENDENT_AMBULATORY_CARE_PROVIDER_SITE_OTHER): Payer: Medicare Other | Admitting: Family Medicine

## 2014-05-26 ENCOUNTER — Encounter: Payer: Self-pay | Admitting: Family Medicine

## 2014-05-26 VITALS — BP 134/84 | HR 88 | Temp 98.1°F | Wt 128.2 lb

## 2014-05-26 DIAGNOSIS — B373 Candidiasis of vulva and vagina: Secondary | ICD-10-CM | POA: Diagnosis not present

## 2014-05-26 DIAGNOSIS — B3731 Acute candidiasis of vulva and vagina: Secondary | ICD-10-CM | POA: Insufficient documentation

## 2014-05-26 MED ORDER — NYSTATIN 100000 UNIT/GM EX CREA: 1.0000 "application " | TOPICAL_CREAM | Freq: Two times a day (BID) | CUTANEOUS | Status: DC

## 2014-05-26 MED ORDER — NYSTATIN-TRIAMCINOLONE 100000-0.1 UNIT/GM-% EX OINT: 1.0000 "application " | TOPICAL_OINTMENT | Freq: Two times a day (BID) | CUTANEOUS | Status: DC

## 2014-05-26 MED ORDER — FLUCONAZOLE 150 MG PO TABS: 150.0000 mg | ORAL_TABLET | ORAL | Status: DC

## 2014-05-26 NOTE — Progress Notes (Signed)
Pre visit review using our clinic review tool, if applicable. No additional management support is needed unless otherwise documented below in the visit note. 

## 2014-05-26 NOTE — Patient Instructions (Addendum)
You have bad yeast infection of private area - treat with oral antifungal sent to pharmacy today, may also use antifungal/steroid cream prescribed today for 1 week then stop and only use antifungal cream. All meds sent to pharmacy. Let us know if after 1-2 weeks no improvement noted. Cool compresses to area as well may help.  Candidal Vulvovaginitis Candidal vulvovaginitis is an infection of the vagina and vulva. The vulva is the skin around the opening of the vagina. This may cause itching and discomfort in and around the vagina.  HOME CARE  Only take medicine as told by your doctor.  Do not have sex (intercourse) until the infection is healed or as told by your doctor.  Practice safe sex.  Tell your sex partner about your infection.  Do not douche or use tampons.  Wear cotton underwear. Do not wear tight pants or panty hose.  Eat yogurt. This may help treat and prevent yeast infections. GET HELP RIGHT AWAY IF:   You have a fever.  Your problems get worse during treatment or do not get better in 3 days.  You have discomfort, irritation, or itching in your vagina or vulva area.  You have pain after sex.  You start to get belly (abdominal) pain. MAKE SURE YOU:  Understand these instructions.  Will watch your condition.  Will get help right away if you are not doing well or get worse. Document Released: 06/16/2008 Document Revised: 03/25/2013 Document Reviewed: 06/16/2008 [facility]ExitCare Patient Information 2015 [place]. This information is not intended to replace advice given to you by your health care provider. Make sure you discuss any questions you have with your health care provider.

## 2014-05-26 NOTE — Progress Notes (Signed)
BP 134/84 mmHg  Pulse 88  Temp(Src) 98.1 F (36.7 C) (Oral)  Wt 128 lb 4 oz (58.174 kg)   CC: vaginiti Subjective:    Patient ID: [name], female    DOB: 11/23/1932, 81 y.o.   MRN: [id number]  HPI: [name] is a 81 y.o. female presenting on 05/26/2014 for Vaginitis and Hemorrhoids   Terrible vaginal itch for for last 3 days. Has tried OTC remedies including triple abx, vagisil, barrier paste, as well as corn starch and vinegar. Tried medicated douche as well. No vaginal discharge. No vaginal odor.   No fevers/chills, abd pain. Has increased yogurt.   Sister passed away suddenly recently, grieving over this.  Known lymphocytic colitis.   Syncope - EEG normal per patient Malvin Johns(Potter). I never received results. Have requested today.  Relevant past medical, surgical, family and social history reviewed and updated as indicated. Interim medical history since our last visit reviewed. Allergies and medications reviewed and updated. Current Outpatient Prescriptions on File Prior to Visit  Medication Sig  . allopurinol (ZYLOPRIM) 100 MG tablet TAKE ONE TABLET EVERY DAY  . ALPRAZolam (XANAX) 0.25 MG tablet TAKE 1 TABLET BY MOUTH TWICE DAILY AS NEEDED  . apixaban (ELIQUIS) 2.5 MG TABS tablet Take 2.5 mg by mouth 2 (two) times daily.  Marland Kitchen. atorvastatin (LIPITOR) 40 MG tablet Take 40 mg by mouth every evening.  . Cholecalciferol (VITAMIN D3) 2000 UNITS capsule Take 2,000 Units by mouth every morning.  . digoxin (LANOXIN) 0.125 MG tablet Take 0.125 mg by mouth every morning.  Marland Kitchen. FLUoxetine (PROZAC) 20 MG tablet Take 1 tablet (20 mg total) by mouth daily.  Marland Kitchen. lisinopril (PRINIVIL,ZESTRIL) 5 MG tablet Take 5 mg by mouth every morning.  . Mesalamine 800 MG TBEC Take 2 tablets (1,600 mg total) by mouth 3 (three) times daily.  . metoprolol tartrate (LOPRESSOR) 25 MG tablet TAKE ONE TABLET BY MOUTH TWICE DAILY  . Multiple Vitamin (MULTIVITAMIN WITH MINERALS) TABS tablet Take 1 tablet by  mouth every morning.  . Oxycodone HCl 10 MG TABS   . calcium carbonate (OS-CAL) 600 MG TABS tablet Take 600 mg by mouth daily with breakfast.  . NITROSTAT 0.4 MG SL tablet PLACE 1 TABLET UNDER TONGUE EVERY 5 MIN AS NEEDED FOR CHEST PAIN IF NO RELIEF IN15 MIN CALL 911 (MAX 3 TABS) (Patient not taking: Reported on 03/05/2014)  . potassium chloride (K-DUR,KLOR-CON) 10 MEQ tablet Take 20 mEq by mouth daily.    No current facility-administered medications on file prior to visit.    Review of Systems Per HPI unless specifically indicated above     Objective:    BP 134/84 mmHg  Pulse 88  Temp(Src) 98.1 F (36.7 C) (Oral)  Wt 128 lb 4 oz (58.174 kg)  Wt Readings from Last 3 Encounters:  05/26/14 128 lb 4 oz (58.174 kg)  03/05/14 125 lb 4 oz (56.813 kg)  01/30/14 123 lb 11.2 oz (56.11 kg)    Physical Exam  Constitutional: She appears well-developed and well-nourished. No distress.  HENT:  Mouth/Throat: Oropharynx is clear and moist. No oropharyngeal exudate.  Cardiovascular: Normal rate and intact distal pulses.  An irregularly irregular rhythm present.  No murmur heard. Pulmonary/Chest: Effort normal and breath sounds normal. No respiratory distress. She has no wheezes. She has no rales.  Abdominal: Soft. Normal appearance and bowel sounds are normal. She exhibits no distension and no mass. There is no hepatosplenomegaly. There is no tenderness. There is no rigidity, no rebound,  no guarding, no CVA tenderness and negative Murphy's sign.  Genitourinary: Rectum normal. Rectal exam shows no external hemorrhoid and no tenderness. Pelvic exam was performed with patient supine. There is rash, tenderness and lesion on the right labia. There is no injury on the right labia. There is rash, tenderness and lesion on the left labia. There is no injury on the left labia.  Erythematous pruritic rash on vulva bilaterally as well as perianally and perineally present, some erosions bilateral labia majora,  some erythematous satellite lesions present as well.   Musculoskeletal: She exhibits no edema.  Psychiatric: Her mood appears anxious.  Nursing note and vitals reviewed.     Assessment & Plan:   Problem List Items Addressed This Visit    Candidal vulvovaginitis - Primary    With extension perianally. Treat with oral diflucan course + mycolog 1 wk course. Then start plain nystatin.  Advised against douching. Less likely psoriatic type rash or contact dermatitis.      Relevant Medications   fluconazole (DIFLUCAN) tablet 150 mg   nystatin-triamcinolone (MYCOLOG) ointment   nystatin cream (MYCOSTATIN)       Follow up plan: Return if symptoms worsen or fail to improve.

## 2014-05-26 NOTE — Assessment & Plan Note (Signed)
With extension perianally. Treat with oral diflucan course + mycolog 1 wk course. Then start plain nystatin.  Advised against douching. Less likely psoriatic type rash or contact dermatitis.

## 2014-05-28 ENCOUNTER — Encounter: Payer: Self-pay | Admitting: Family Medicine

## 2014-05-29 ENCOUNTER — Telehealth: Payer: Self-pay | Admitting: Family Medicine

## 2014-05-29 NOTE — Telephone Encounter (Signed)
Pt called stated she received a letter from [place] stating it was time for her mammogram Any day but Thursday and any time

## 2014-06-01 ENCOUNTER — Telehealth: Payer: Self-pay | Admitting: *Deleted

## 2014-06-01 ENCOUNTER — Encounter: Payer: Self-pay | Admitting: Family Medicine

## 2014-06-01 ENCOUNTER — Ambulatory Visit: Payer: Self-pay | Admitting: Family Medicine

## 2014-06-01 ENCOUNTER — Ambulatory Visit (INDEPENDENT_AMBULATORY_CARE_PROVIDER_SITE_OTHER): Payer: Medicare Other | Admitting: Family Medicine

## 2014-06-01 VITALS — BP 144/64 | HR 72 | Temp 97.4°F

## 2014-06-01 DIAGNOSIS — S0993XA Unspecified injury of face, initial encounter: Secondary | ICD-10-CM

## 2014-06-01 DIAGNOSIS — R55 Syncope and collapse: Secondary | ICD-10-CM | POA: Diagnosis not present

## 2014-06-01 DIAGNOSIS — E785 Hyperlipidemia, unspecified: Secondary | ICD-10-CM | POA: Diagnosis not present

## 2014-06-01 DIAGNOSIS — I6782 Cerebral ischemia: Secondary | ICD-10-CM | POA: Diagnosis not present

## 2014-06-01 DIAGNOSIS — W19XXXA Unspecified fall, initial encounter: Secondary | ICD-10-CM

## 2014-06-01 DIAGNOSIS — R220 Localized swelling, mass and lump, head: Secondary | ICD-10-CM | POA: Diagnosis not present

## 2014-06-01 DIAGNOSIS — S0003XA Contusion of scalp, initial encounter: Secondary | ICD-10-CM | POA: Diagnosis not present

## 2014-06-01 DIAGNOSIS — S0990XA Unspecified injury of head, initial encounter: Secondary | ICD-10-CM | POA: Diagnosis not present

## 2014-06-01 HISTORY — DX: Unspecified injury of face, initial encounter: S09.93XA

## 2014-06-01 LAB — CBC WITH DIFFERENTIAL/PLATELET
Basophils Absolute: 0.1 10*3/uL (ref 0.0–0.1)
Basophils Relative: 1 % (ref 0.0–3.0)
Eosinophils Absolute: 1 10*3/uL — ABNORMAL HIGH (ref 0.0–0.7)
Eosinophils Relative: 12.7 % — ABNORMAL HIGH (ref 0.0–5.0)
HCT: 40.7 % (ref 36.0–46.0)
HEMOGLOBIN: 13.5 g/dL (ref 12.0–15.0)
LYMPHS PCT: 19.4 % (ref 12.0–46.0)
Lymphs Abs: 1.5 10*3/uL (ref 0.7–4.0)
MCHC: 33.2 g/dL (ref 30.0–36.0)
MCV: 99.3 fl (ref 78.0–100.0)
MONOS PCT: 8 % (ref 3.0–12.0)
Monocytes Absolute: 0.6 10*3/uL (ref 0.1–1.0)
NEUTROS ABS: 4.4 10*3/uL (ref 1.4–7.7)
Neutrophils Relative %: 58.9 % (ref 43.0–77.0)
Platelets: 216 10*3/uL (ref 150.0–400.0)
RBC: 4.1 Mil/uL (ref 3.87–5.11)
RDW: 14 % (ref 11.5–15.5)
WBC: 7.5 10*3/uL (ref 4.0–10.5)

## 2014-06-01 LAB — RENAL FUNCTION PANEL
Albumin: 4.2 g/dL (ref 3.5–5.2)
BUN: 12 mg/dL (ref 6–23)
CO2: 32 meq/L (ref 19–32)
CREATININE: 0.75 mg/dL (ref 0.40–1.20)
Calcium: 9.9 mg/dL (ref 8.4–10.5)
Chloride: 101 mEq/L (ref 96–112)
GFR: 79.03 mL/min (ref >60.00)
GLUCOSE: 112 mg/dL — AB (ref 70–99)
Phosphorus: 3.9 mg/dL (ref 2.3–4.6)
Potassium: 3.9 mEq/L (ref 3.5–5.1)
SODIUM: 140 meq/L (ref 135–145)

## 2014-06-01 LAB — TSH: TSH: 2.72 u[IU]/mL (ref 0.35–4.50)

## 2014-06-01 NOTE — Telephone Encounter (Signed)
plz notify CT scans reassuringly ok - just showing hematoma/knot R forehead but no facial fracture.  Will call with results of labwork and plan.

## 2014-06-01 NOTE — Assessment & Plan Note (Addendum)
Recurrent episodes of unclear etiology s/p cardiology and neurology evaluations. EKG today - atrial fibrillation with rate 60s, no acute ST/T changes. rec start using [facility] regularly.

## 2014-06-01 NOTE — Patient Instructions (Addendum)
Start using [facility] regularly. Bring me mammogram letter. EKG and blood work today. Pass by Marion's office to schedule CT scan for today. We will call you with results.

## 2014-06-01 NOTE — Progress Notes (Signed)
BP 144/64 mmHg  Pulse 72  Temp(Src) 97.4 F (36.3 C) (Oral)   CC: fall with facial trauma Subjective:    Patient ID: [name], female    DOB: 10/17/1932, 81 y.o.   MRN: [id number]  HPI: [name] is a 81 y.o. female presenting on 06/01/2014 for Fall   Pt presents today as a walk-in with CC "I fell over weekend". Hit her face and has large facial bruise. Passed out outside her vet's office while walking to her car. Denies any premonitory symptoms. Denies chest pain or tightness, palpitations, headache, no new drainage out of ears or out of nose.   Does feel very unsteady when transferring out of chair or when waking up at night time. Feels pressure at knot on forehead when she bends over.   Has had recurrent syncopal episodes thought orthostatic after extensive cardiac and neurological work-up including EEG. States bp at home has been running very well - 120s/70s. HR 90s.  Treated last week for candidal vulvo-vaginintis with diflucan and mycolog x1 wk then nystatin cream.   Lymphocytic colitis - mild diarrhea. Compliant with mesalamine.   Relevant past medical, surgical, family and social history reviewed and updated as indicated. Interim medical history since our last visit reviewed. Allergies and medications reviewed and updated. Current Outpatient Prescriptions on File Prior to Visit  Medication Sig  . allopurinol (ZYLOPRIM) 100 MG tablet TAKE ONE TABLET EVERY DAY  . ALPRAZolam (XANAX) 0.25 MG tablet TAKE 1 TABLET BY MOUTH TWICE DAILY AS NEEDED  . apixaban (ELIQUIS) 2.5 MG TABS tablet Take 2.5 mg by mouth 2 (two) times daily.  Marland Kitchen atorvastatin (LIPITOR) 40 MG tablet Take 40 mg by mouth every evening.  . calcium carbonate (OS-CAL) 600 MG TABS tablet Take 600 mg by mouth daily with breakfast.  . Cholecalciferol (VITAMIN D3) 2000 UNITS capsule Take 2,000 Units by mouth every morning.  . digoxin (LANOXIN) 0.125 MG tablet Take 0.125 mg by mouth every morning.  .  fluconazole (DIFLUCAN) 150 MG tablet Take 1 tablet (150 mg total) by mouth every 3 (three) days. For 3 doses  . FLUoxetine (PROZAC) 20 MG tablet Take 1 tablet (20 mg total) by mouth daily.  Marland Kitchen lisinopril (PRINIVIL,ZESTRIL) 5 MG tablet Take 5 mg by mouth every morning.  . Mesalamine 800 MG TBEC Take 2 tablets (1,600 mg total) by mouth 3 (three) times daily.  . metoprolol tartrate (LOPRESSOR) 25 MG tablet TAKE ONE TABLET BY MOUTH TWICE DAILY  . Multiple Vitamin (MULTIVITAMIN WITH MINERALS) TABS tablet Take 1 tablet by mouth every morning.  Marland Kitchen NITROSTAT 0.4 MG SL tablet PLACE 1 TABLET UNDER TONGUE EVERY 5 MIN AS NEEDED FOR CHEST PAIN IF NO RELIEF IN15 MIN CALL 911 (MAX 3 TABS)  . nystatin cream (MYCOSTATIN) Apply 1 application topically 2 (two) times daily.  Marland Kitchen nystatin-triamcinolone ointment (MYCOLOG) Apply 1 application topically 2 (two) times daily. For 1 week then stop  . potassium chloride (K-DUR,KLOR-CON) 10 MEQ tablet Take 20 mEq by mouth daily.    No current facility-administered medications on file prior to visit.   Past Medical History  Diagnosis Date  . Dyslipidemia   . Depression with anxiety     celexa started 04/2010  . Hyperlipidemia   . Hypertension   . CAD (coronary artery disease)     95% LAD stenosis, Cypher x 02 September 1996  . Atrial fibrillation   . Prediabetes 2001  . Cervicalgia   . LBP (low back pain) 2005  spinal stenosis and bulging discs at L2-L5 s/p lumbar laminectomy Dutch Quint)  . Osteopenia 07/2013    T score -2.2 L femur  . Gout     prior PCP stopped allopurinol  . CHF (congestive heart failure) 2014    on recent hospitalization  . Arthritis   . Syncopal episodes 2015    orthostatic, normal EEG and neuro eval ([facility])  . T12 compression fracture 2015    by CT  . Atherosclerosis of abdominal aorta   . Peripheral neuropathy since feb 2015    right arm   . Lymphocytic colitis 10/28/2013    Past Surgical History  Procedure Laterality Date  . Lumbar  laminectomy  2005    Dr. Dutch Quint  . Tonsillectomy    . Cardiac catheterization    . Percutaneous coronary stent intervention (pci-s)      stent x3 in heart  . Cataract extraction Bilateral   . Dexa  07/2013    T score -2.2 L femur  . Colonoscopy with propofol N/A 10/16/2013    Rachael Fee, MD   Review of Systems Per HPI unless specifically indicated above     Objective:    BP 144/64 mmHg  Pulse 72  Temp(Src) 97.4 F (36.3 C) (Oral)  Wt Readings from Last 3 Encounters:  05/26/14 128 lb 4 oz (58.174 kg)  03/05/14 125 lb 4 oz (56.813 kg)  01/30/14 123 lb 11.2 oz (56.11 kg)    Physical Exam  Constitutional: She appears well-developed and well-nourished. No distress.  HENT:  Head: Head is with raccoon's eyes and with contusion. Head is without Battle's sign.    Right Ear: External ear normal.  Left Ear: External ear normal.  Mouth/Throat: Oropharynx is clear and moist. No oropharyngeal exudate.  R forehead abrasion with knot/hematoma and bruising present Periorbital ecchymoses bilaterally  Eyes: Conjunctivae and EOM are normal. Pupils are equal, round, and reactive to light. No scleral icterus.  Neck: Normal range of motion. Neck supple. No thyromegaly present.  Cardiovascular: Normal rate, normal heart sounds and intact distal pulses.  An irregular rhythm present.  No murmur heard. Mildly irregular  Pulmonary/Chest: Effort normal and breath sounds normal. No respiratory distress. She has no wheezes. She has no rales.  Musculoskeletal: She exhibits no edema.  Lymphadenopathy:    She has no cervical adenopathy.  Skin: Skin is warm and dry. Abrasion, bruising and ecchymosis noted.  Psychiatric:  Talkative, circumferential  Nursing note and vitals reviewed.  Results for orders placed or performed in visit on 01/01/14  Digoxin level  Result Value Ref Range   Digoxin Level 0.6 (L) 0.8 - 2.0 ng/mL  Hemoglobin A1c  Result Value Ref Range   Hgb A1c MFr Bld 6.6 (H) 4.6 -  6.5 %  TSH  Result Value Ref Range   TSH 0.89 0.35 - 4.50 uIU/mL  T4, free  Result Value Ref Range   Free T4 1.01 0.60 - 1.60 ng/dL  CBC with Differential  Result Value Ref Range   WBC 9.8 4.0 - 10.5 K/uL   RBC 4.01 3.87 - 5.11 Mil/uL   Hemoglobin 13.0 12.0 - 15.0 g/dL   HCT 04.5 40.9 - 81.1 %   MCV 96.8 78.0 - 100.0 fl   MCHC 33.5 30.0 - 36.0 g/dL   RDW 91.4 78.2 - 95.6 %   Platelets 248.0 150.0 - 400.0 K/uL   Neutrophils Relative % 65.1 43.0 - 77.0 %   Lymphocytes Relative 19.1 12.0 - 46.0 %   Monocytes  Relative 7.8 3.0 - 12.0 %   Eosinophils Relative 7.4 (H) 0.0 - 5.0 %   Basophils Relative 0.6 0.0 - 3.0 %   Neutro Abs 6.4 1.4 - 7.7 K/uL   Lymphs Abs 1.9 0.7 - 4.0 K/uL   Monocytes Absolute 0.8 0.1 - 1.0 K/uL   Eosinophils Absolute 0.7 0.0 - 0.7 K/uL   Basophils Absolute 0.1 0.0 - 0.1 K/uL  Comprehensive metabolic panel  Result Value Ref Range   Sodium 130 (L) 135 - 145 mEq/L   Potassium 3.5 3.5 - 5.1 mEq/L   Chloride 94 (L) 96 - 112 mEq/L   CO2 26 19 - 32 mEq/L   Glucose, Bld 97 70 - 99 mg/dL   BUN 15 6 - 23 mg/dL   Creatinine, Ser 1.3 (H) 0.4 - 1.2 mg/dL   Total Bilirubin 1.3 (H) 0.2 - 1.2 mg/dL   Alkaline Phosphatase 84 39 - 117 U/L   AST 25 0 - 37 U/L   ALT 20 0 - 35 U/L   Total Protein 7.3 6.0 - 8.3 g/dL   Albumin 4.1 3.5 - 5.2 g/dL   Calcium 9.4 8.4 - 96.010.5 mg/dL   GFR 45.4041.20 (L) >98.11>60.00 mL/min      Assessment & Plan:   Problem List Items Addressed This Visit    Syncope    Recurrent episodes of unclear etiology s/p cardiology and neurology evaluations. EKG today - atrial fibrillation with rate 60s, no acute ST/T changes. rec start using Labelle regularly.      Relevant Orders   CBC with Differential/Platelet   Renal function panel   TSH   Digoxin level   EKG 12-Lead (Completed)   Facial trauma - Primary    Fall 3d ago on blood thinners with facial trauma and now with raccoon eyes.. I touched base with radiology - recommend facial and head CT without  contrast to eval for basilar skull fracture or hematoma etc. Will call patient with results. In interim, will also check CBC, TSH, renal panel and digoxin level. Pt agrees with plan.      Relevant Orders   CT Maxillofacial WO CM   CT Head Wo Contrast    Other Visit Diagnoses    Fall, initial encounter        Relevant Orders    CT Maxillofacial WO CM        Follow up plan: No Follow-up on file.

## 2014-06-01 NOTE — Telephone Encounter (Signed)
[facility] called with CT results:  Mild chronic microvascular ischemic changes in white matter; no acute intracranial abnormality; soft tissue swelling to right face; negative for facial fracture.   Patient has gone home to await results.

## 2014-06-01 NOTE — Assessment & Plan Note (Addendum)
Fall 3d ago on blood thinners with facial trauma and now with raccoon eyes.. I touched base with radiology - recommend facial and head CT without contrast to eval for basilar skull fracture or hematoma etc. Will call patient with results. In interim, will also check CBC, TSH, renal panel and digoxin level. Pt agrees with plan.

## 2014-06-01 NOTE — Progress Notes (Signed)
Pre visit review using our clinic review tool, if applicable. No additional management support is needed unless otherwise documented below in the visit note. 

## 2014-06-01 NOTE — Telephone Encounter (Signed)
Patient notified and verbalized understanding. 

## 2014-06-02 ENCOUNTER — Other Ambulatory Visit: Payer: Self-pay | Admitting: Family Medicine

## 2014-06-02 DIAGNOSIS — M4727 Other spondylosis with radiculopathy, lumbosacral region: Secondary | ICD-10-CM | POA: Diagnosis not present

## 2014-06-02 DIAGNOSIS — G894 Chronic pain syndrome: Secondary | ICD-10-CM | POA: Diagnosis not present

## 2014-06-02 DIAGNOSIS — M47812 Spondylosis without myelopathy or radiculopathy, cervical region: Secondary | ICD-10-CM | POA: Diagnosis not present

## 2014-06-02 DIAGNOSIS — M961 Postlaminectomy syndrome, not elsewhere classified: Secondary | ICD-10-CM | POA: Diagnosis not present

## 2014-06-02 LAB — DIGOXIN LEVEL: Digoxin Level: 0.6 ng/mL — ABNORMAL LOW (ref 0.8–2.0)

## 2014-06-04 ENCOUNTER — Encounter: Payer: Self-pay | Admitting: *Deleted

## 2014-06-17 NOTE — Telephone Encounter (Signed)
[facility] appt made for 06/24/14 at 2:40pm.

## 2014-06-24 ENCOUNTER — Ambulatory Visit: Payer: Self-pay | Admitting: Family Medicine

## 2014-06-24 DIAGNOSIS — Z1231 Encounter for screening mammogram for malignant neoplasm of breast: Secondary | ICD-10-CM | POA: Diagnosis not present

## 2014-06-24 LAB — HM MAMMOGRAPHY: HM MAMMO: NORMAL

## 2014-06-29 ENCOUNTER — Encounter: Payer: Self-pay | Admitting: *Deleted

## 2014-06-29 ENCOUNTER — Encounter: Payer: Self-pay | Admitting: Family Medicine

## 2014-06-30 DIAGNOSIS — M4727 Other spondylosis with radiculopathy, lumbosacral region: Secondary | ICD-10-CM | POA: Diagnosis not present

## 2014-06-30 DIAGNOSIS — G894 Chronic pain syndrome: Secondary | ICD-10-CM | POA: Diagnosis not present

## 2014-06-30 DIAGNOSIS — M47812 Spondylosis without myelopathy or radiculopathy, cervical region: Secondary | ICD-10-CM | POA: Diagnosis not present

## 2014-06-30 DIAGNOSIS — M961 Postlaminectomy syndrome, not elsewhere classified: Secondary | ICD-10-CM | POA: Diagnosis not present

## 2014-07-01 ENCOUNTER — Telehealth: Payer: Self-pay | Admitting: Family Medicine

## 2014-07-01 NOTE — Telephone Encounter (Signed)
Letter was mailed on 06/29/14 advising results.

## 2014-07-01 NOTE — Telephone Encounter (Signed)
Pt called in wanting results from mammogram.  Please call after about an hour (she is going shopping) or put a letter in the mail.  Thanks.

## 2014-07-10 ENCOUNTER — Telehealth: Payer: Self-pay

## 2014-07-10 NOTE — Telephone Encounter (Signed)
Pt left v/m requesting letter stating that pt is disabled due to heart condition,severe arthritis and gout and that pts son is her caregiver, son gets her groceries and takes care of pt at home. Pt is trying to get assistance from social services due to pts bills being expensive. Pt request cb when letter ready. Pt last seen 06/01/2014.

## 2014-07-17 NOTE — Telephone Encounter (Signed)
Letter written and placed in [name]'s box. 

## 2014-07-20 NOTE — Telephone Encounter (Signed)
Attempted to call patient. No answer, no machine. Will try again later.  

## 2014-07-20 NOTE — Telephone Encounter (Signed)
Pt returned your call. Please call back.

## 2014-07-20 NOTE — Telephone Encounter (Signed)
Patient notified and letter placed up front for pick up. 

## 2014-07-21 ENCOUNTER — Other Ambulatory Visit: Payer: Self-pay | Admitting: Family Medicine

## 2014-07-24 NOTE — Discharge Summary (Signed)
PATIENT NAME:  [name], [name] MR#:  [id number] DATE OF BIRTH:  01-11-1933  DATE OF ADMISSION:  04/14/2012 DATE OF DISCHARGE:  04/17/2012  ADMITTING DIAGNOSIS: Atrial fibrillation, rapid ventricular response.    DISCHARGE DIAGNOSES:   1.  Atrial fibrillation, rapid ventricular response.  2.  Congestive heart failure, left heart, acute, diastolic.  3.  Acute bronchitis.  4.  Delirium due to steroids, resolved.  5.  Hypertension.  6.  Hyperlipidemia.  7.  History of gout.  8.  Anxiety.  9.  Diabetes mellitus, hemoglobin A1c 7.2.  10.  Generalized weakness.  11.  History of left ventricular hypertrophy with ejection fraction more than 55%, mild MR, TR, aortic regurgitation, elevated right ventricular systolic pressure is 30 to 40 mm Hg in May 2012. Current echocardiogram done on this admission is still pending.   DISCHARGE CONDITION: Fair.   DISCHARGE MEDICATIONS: The patient is to resume her outpatient medications which are:  1.  Alprazolam 0.25 mg p.o. 3 times daily.  2.  Coenzyme Q10 100 mg p.o. daily.  3.  Simvastatin 40 mg p.o. at bedtime.  4.  Aspirin 81 mg per day.  5.  Multivitamin once daily.  6.  Acetaminophen/hydrocodone 325/10 mg, 1 tablet every 6 hours as needed.  7.  Ibuprofen 600 mg p.o. 3 times daily as needed.  8.  Allopurinol 100 mg p.o. at bedtime.  9.  Nitrostat 0.4 mg sublingually every 5 minutes as needed.  10.  Benzonatate 200 mg p.o. every 8 hours as needed.  11.  Metoprolol tartrate 50 mg p.o. 3 times daily. This is a new dose.  12.  Digoxin 125 mcg p.o. daily.  13.  Citalopram 10 mg p.o. at bedtime.  14.  Albuterol ipratropium 2.5/0.5 mg in 3 mL inhalation solution, 1 inhalation 4 times day.  15.  Furosemide 20 mg p.o. daily.  16.  Levofloxacin 250 mg p.o. daily.  17.  Lisinopril 2.5 mg p.o. at bedtime.  18.  Patient is not to take potassium chloride, hydrochlorothiazide or prednisone.   HOME OXYGEN: None.   DIET: Two grams salt, low fat, low  cholesterol, carbohydrate controlled diet, regular consistency.   ACTIVITY LIMITATIONS: As tolerated.   FOLLOWUP  APPOINTMENT: With Dr. Randa Lynn in 2 days after discharge.   CONSULTANTS: Care management.   RADIOLOGIC STUDIES: Chest x-ray, portable, single view on 04/14/2012 revealed findings consistent with congestive heart failure with mild pulmonary interstitial edema.   HISTORY OF PRESENT ILLNESS:  The patient is 81 year old female with past medical history significant for history of A. fib., coronary artery disease, anxiety, hyperlipidemia, as well as hypertension and gout, who presented to the hospital with complaints of shortness of breath. Please refer to Dr. Mathews Robinsons admission note on 04/14/2012. Apparently, patient has been having cold for the past 2 or 3 weeks. She was taking over-the-counter medications and was getting progressively short of breath. She was also having green-looking phlegm with bloody solution. She was seen at a walk-in clinic where she was diagnosed with upper respiratory infection and was given Z-Pak, however, even with Z-Pak she did not improve. She was seen by her primary care physician, Dr. Randa Lynn, who gave her nebulizer treatments as well as Symbicort and prednisone taper. She did not get any better. She had a few panic attacks with heart rate increasing to 160 and elevated blood pressure. She was very dizzy and was having poor appetite. She came into the Emergency Room for further evaluation where she was noted to have  A. fib./RVR. She was given Cardizem IV push, was started on Cardizem drip. Congestive heart failure was noted on chest x-ray.   On arrival to the hospital, the patient's pulse was 134, temperature was 97.8, respiratory rate was 20, blood pressure 133/76, saturation was 93% on room air. Physical exam revealed decreased breath sounds bilaterally, positive rales bilateral bases as well as inferiorly wheezes were noted. No other abnormalities were noted except  mild 1+ edema in lower extremities.   DATA:  The patient's lab data showed elevated B-type natriuretic peptide of 5104. Glucose was 169, otherwise BMP was unremarkable. Magnesium level was low at 1.4. Liver enzymes revealed AST elevation to 68. Cardiac enzymes x 3 were negative. TSH was normal at 0.813. White blood cell count was elevated to 14.1, hemoglobin was 13.6, platelet count 276. Coagulation panel was unremarkable. Influenza test was negative. Her urine cultures showed also mixed bacterial organisms, results suggestive of contamination. Urinalysis was unremarkable. The patient's chest x-ray was consistent with congestive heart failure.   HOSPITAL COURSE:  1.  The patient was admitted to the hospital for further evaluation. Her heart rate was controlled with increased doses of metoprolol as well as digoxin. She was initially managed on Cardizem as well, however, with Cardizem patient had some intermittent pauses. For this reason the Cardizem was discontinued.  With metoprolol as well as digoxin alone the patient did quite well and her heart rate improved. On day of discharge, the patient's heart rate is ranging between 90s to 100s and was around 110 whenever she was exerting herself. Her blood pressure was remaining also stable. On the day of discharge, the patient's temperature is 97.9, pulse was 90s to 100s, however, 110 on exertion; respiration rate was 18 to 20, blood pressure 121/74, saturation 95% to 96% on room air at rest as well as on exertion. It was felt that the patient is to continue a rate-controlling medications with increased doses of metoprolol as well as digoxin. She is to continue anticoagulation with aspirin therapy. No other anticoagulation was given due to her propensity to falls. She is to continue Lasix as well as lisinopril for her congestive heart failure. Echocardiogram was ordered, however, results were not received by the day of discharge.  It is recommended to follow up with  echocardiogram results and make decisions about therapies as needed.  2.  In regards to acute bronchitis, initially the patient was started on high doses of steroids, however, she developed delirium and her steroids were discontinued. She was continued on antibiotic therapy with levofloxacin and improved significantly. On the day of discharge, patient did not have any significant discomfort or wheezing.  3.  In regards to hypertension. The patient is to continue her outpatient medications with  metoprolol.  4.  Hyperlipidemia. The patient is to continue her outpatient management of her hyperlipidemia. 5.  Gout and anxiety. She is to continue her management.  6.  The patient was noted to have diabetes. Her hemoglobin A1c was found to be 7.2. She was advised to continue diabetic diet.  7.  For generalized weakness, she was advised also for physical therapy, however, she did quite well in the hospital and did not require any home health physical therapy.   She is to follow up with her primary physician, Dr. Randa Lynn, in the next few days after discharge.  TIME SPENT:  40 minutes.      ____________________________ Katharina Caper, MD rv:cs D: 04/17/2012 15:19:00 ET T: 04/17/2012 18:42:01 ET JOB#: [id number]  cc: Katharina Caper, MD, <Dictator> Reola Mosher. Randa Lynn, MD Katharina Caper MD ELECTRONICALLY SIGNED 04/28/2012 21:36

## 2014-07-24 NOTE — H&P (Signed)
PATIENT NAME:  [name], [name] MR#:  [id number] DATE OF BIRTH:  [birth year]  DATE OF ADMISSION:  04/14/2012  PRIMARY CARE PHYSICIAN: Alonna Buckler, MD  CARDIOLOGIST:  Rollene Rotunda, MD  CHIEF COMPLAINT: Shortness of breath.   HISTORY OF PRESENT ILLNESS: This is a 81 year old female who has been having a cold for the past 3 to 4 weeks. She has been taking over-the-counter meds.  She could not breathe. She has been coughing up green phlegm with bloody solution. She went to the walk-in clinic, was told she had an upper respiratory tract infection and given a Z-Pak. She lost her voice. After the Z-Pak, she went to see Dr. Randa Lynn and he was given nebulizer treatments, Symbicort and a prednisone taper. She still has not gotten any better. Yesterday, she had two panic attacks as she found that her pulse rate was up at 160 and her blood pressure was high. She felt dizzy when she was walking around, and she could not rest night. She has been having a poor appetite. She came to the ER for further evaluation. She was found to be in rapid atrial fibrillation, given a Cardizem push and started on a Cardizem drip. Chest x-ray had findings suggestive of congestive heart failure, and elevated BNP was obtained at 5100. The patient also was found to have a low magnesium and an elevated white count. Hospitalist services were contacted for further evaluation.   PAST MEDICAL HISTORY: Atrial fibrillation, coronary artery disease, anxiety, hyperlipidemia, hypertension and gout.   PAST SURGICAL HISTORY: Back surgery many years ago, tonsils and lens implants.   ALLERGIES: CODEINE, HYDROCODONE, MAGNESIUM OXIDE, NSAIDS, OPIATES, AND VYTORIN.   FAMILY HISTORY: Father died of old age. Mother died of Alzheimer's.   MEDICATIONS: Pharmacy tech is awaiting for the fax to come over from the pharmacy on the medication list, and then she will update the Prescription Writer. The patient states that she does take metoprolol 50 mg twice  a day.   REVIEW OF SYSTEMS:  CONSTITUTIONAL: Positive for night sweats. No fever or chills. Positive for weight gain. Positive for fatigue.  EYES: No issues with the eyes.  EARS, NOSE, MOUTH, AND THROAT: Decreased hearing, hoarse voice. No difficulty swallowing, poor appetite.  CARDIOVASCULAR: Occasional chest pain. Positive for palpitations.  RESPIRATORY: Positive for shortness of breath. Positive for cough, red-green phlegm.  GASTROINTESTINAL: No nausea. No vomiting. No abdominal pain. No diarrhea. Positive for constipation. No bright red blood per rectum. No melena.  GENITOURINARY: No burning on urination, no hematuria.  MUSCULOSKELETAL: Positive for low back pain and arthritis pain.  NEUROLOGICAL: No fainting or blackouts.  PSYCHIATRIC: No anxiety or depression.  ENDOCRINE: No thyroid problems.  HEMATOLOGIC/LYMPHATIC: No anemia. No easy bruising or bleeding.   PHYSICAL EXAMINATION:  VITAL SIGNS: On presentation to the ER, pulse of 134, temperature 97.8, respirations 20, blood pressure 133/76, pulse oximetry 93% on room air.  GENERAL: No respiratory distress.  HEENT: Eyes: Conjunctivae and lids normal. Pupils are equal, round, and reactive to light. Extraocular muscles are intact. No nystagmus. Ears, nose, mouth, and throat: Tympanic membrane blocked by wax. Nasal mucosa no erythema. Throat: Positive for erythema. No exudate seen. Lips and gums no lesions.  NECK: No JVD. No bruits. No lymphadenopathy. No thyromegaly. No thyroid nodules palpated.  RESPIRATORY: Decreased breath sounds bilaterally. Positive rales bilateral bases, anteriorly wheeze heard.  CARDIOVASCULAR: S1, S2 normal, irregularly irregular, tachycardic. No gallops, rubs, or murmurs heard. Carotid upstroke 2+ bilaterally. No bruits. Dorsalis pedis pulses 2+ bilaterally.  Edema 1+ bilateral lower extremity.  ABDOMEN: Soft, nontender. No organomegaly or splenomegaly. Normoactive bowel sounds. No masses felt.  LYMPHATIC: No lymph  nodes in the neck.  MUSCULOSKELETAL: Edema 1+. No clubbing. No cyanosis.  SKIN: Secondary excoriations bilateral lower extremity.   NEUROLOGICAL: Cranial nerves II through XII are grossly intact. Deep tendon reflexes 1+ bilateral lower extremity.  PSYCHIATRIC: The patient is oriented to person, place, and time.   LABORATORY AND RADIOLOGICAL DATA:  Urinalysis: Trace leukocyte esterase. BNP 5104. Glucose 169, BUN 18, creatinine 0.99, sodium 140, potassium 4.1, chloride 102, CO2 29, calcium 9.4. Liver function tests: AST elevated at 68. White blood cell count 14.1, hemoglobin and hematocrit 13.6 and 40.7, platelet count of 276. Magnesium 1.4, phosphorus 3.1. Troponin negative. INR 1.0.   Chest x-ray: Findings consistent with congestive heart failure. EKG: Atrial fibrillation, 139 beats per minute, flipped T waves laterally.   ASSESSMENT AND PLAN: 1. Rapid atrial fibrillation: I will admit to the critical care unit for a Cardizem drip. I will increase the patient's metoprolol to 50 mg t.i.d. I will obtain an echocardiogram. I will check thyroid function, continue to monitor closely.  2. Acute congestive heart failure: This is probably secondary to the rapid atrial fibrillation. I will give Lasix 20 mg IV b.i.d., obtain an echocardiogram, continue metoprolol at this point.  3. Acute bronchitis with wheeze and leukocytosis: I will give Solu-Medrol, DuoNebs nebulizer solution and Levaquin at this point. I will obtain a sputum culture. I will also send off an influenza swab.  4. Hypertension: Blood pressure currently is stable. Watch closely on Cardizem drip and increased metoprolol.  5. Hyperlipidemia: I will continue Zocor.  6. Gout: I was on allopurinol in the past. I will continue that.  7. Anxiety: On Xanax. I will continue that.   TIME SPENT ON ADMISSION: 55 minutes.  ____________________________ Herschell Dimes. Renae Gloss, MD rjw:cb D: 04/14/2012 12:14:51 ET T: 04/14/2012 14:27:38  ET JOB#: [id number]  cc: Herschell Dimes. Renae Gloss, MD, <Dictator> Reola Mosher. Randa Lynn, MD Rollene Rotunda, MD Salley Scarlet MD ELECTRONICALLY SIGNED 04/18/2012 08:65

## 2014-07-24 NOTE — Consult Note (Signed)
General Aspect 81 year old female with PMH of Atrial fibrillation, syncope, coronary artery disease, anxiety, hyperlipidemia, hypertension and gout presenting with dizziness, tachycardia, URI sx. Cardiology was consulted for atrial fibrillation.  She reports that she has had URI sx for the past 3 to 4 weeks. She has been taking over-the-counter meds, coughing up green phlegm.  She went to the walk-in clinic and given a Z-Pak. After the Z-Pak, she went to see Dr. Arline Asp and started on nebulizer treatments, Symbicort and a prednisone taper. she measured her pulse rate  at 160 and her blood pressure was high. She felt dizzy when she was walking around, and she could not sleep.    She came to the ER for further evaluation. She was found to be in rapid atrial fibrillation, given a Cardizem push and started on a Cardizem drip. Chest x-ray had findings suggestive of congestive heart failure, and elevated BNP was obtained at 5100.   H/o syncope. She was hospitalized 2012 and had a work up with echocardiography demonstrating an EF 55%. Carotid Dopplers were negative. Telemetry demonstrated ectopy. She was slightly hypokalemic and had a slightly low magnesium. She wore an outpatient Holter with no significant arrhythmias.  She has also had worn a 21 day event monitor.  This did not "stick" very well per her report but it showed no arrhythmias.  She didn't have any syncope while wearing it.  She has had three syncopal episodes since this [place] hospitalizations.      PAST MEDICAL HISTORY: Atrial fibrillation,  coronary artery disease,  anxiety,  hyperlipidemia,  hypertension  and gout.   PAST SURGICAL HISTORY: Back surgery many years ago, tonsils and lens implants.   ALLERGIES: CODEINE, HYDROCODONE, MAGNESIUM OXIDE, NSAIDS, OPIATES, AND VYTORIN.   FAMILY HISTORY:  Father died of old age. Mother died of Alzheimer's.  Social Hx: Lives at home, no smoking, etoh   Physical Exam:   GEN well  developed, well nourished    HEENT red conjunctivae    NECK supple  No masses    RESP normal resp effort  clear BS    CARD Irregular rate and rhythm  Tachycardic    ABD denies tenderness  soft    EXTR negative edema    SKIN normal to palpation    NEURO motor/sensory function intact    PSYCH alert, A+O to time, place, person, good insight   Review of Systems:   Subjective/Chief Complaint cough, palpitations    General: Fatigue    Skin: No Complaints    ENT: No Complaints    Eyes: No Complaints    Neck: No Complaints    Respiratory: No Complaints    Cardiovascular: Palpitations    Gastrointestinal: No Complaints    Genitourinary: No Complaints    Vascular: No Complaints    Musculoskeletal: No Complaints    Neurologic: No Complaints    Hematologic: No Complaints    Endocrine: No Complaints    Review of Systems: All other systems were reviewed and found to be negative        Admit Reason:   Rapid atrial fibrillation: (427.31) Active, ICD9, Atrial fibrillation  Home Medications: Medication Instructions Status  predniSONE 20 mg oral tablet 3 tab(s) orally today, then decrease by 1/2 tab (39m) orally once a day until finished. Active  alprazolam 0.25 mg oral tablet 1 tab(s) orally 3 times a day  Active  Co-Q10 100 mg oral capsule 1 cap(s) orally once a day  Active  simvastatin 40 mg oral  tablet 1 tab(s) orally once a day (at bedtime) for cholesterol Active  Klor-Con M20 oral tablet, extended release 1 tab(s) orally once a day  Active  hydrochlorothiazide 25 mg oral tablet 1 tab(s) orally once a day  Active  aspirin 81 mg oral tablet 1 tab(s) orally once a day  Active  One-A-Day 50+ oral tablet 1 tab(s) orally once a day  Active  acetaminophen-hydrocodone 325 mg-10 mg oral tablet 1 tab(s) orally every 6 hours as needed for pain.  Active  ibuprofen 600 mg oral tablet 1 tab(s) orally 3 times a day as needed with food. Active  allopurinol 100 mg oral tablet  1 tab(s) orally once a day (at bedtime) as needed for gout. Active  Nitrostat 0.4 mg sublingual tablet 1 tab(s) sublingual every 5 minutes up to 3 tabs as needed for chest pain.  Active  Metoprolol Tartrate 25 mg oral tablet 2 tabs (22m) orally 2 times a day. Active  benzonatate 200 mg oral capsule 1 cap(s) orally every 8 hours as needed for cough. Active   Lab Results:  Thyroid:  13-Jan-14 01:50    Thyroid Stimulating Hormone 0.813 (0.45-4.50 (International Unit)  ----------------------- Pregnant patients have  different reference  ranges for TSH:  - - - - - - - - - -  Pregnant, first trimetser:  0.36 - 2.50 uIU/mL)  Routine Chem:  13-Jan-14 01:50    Glucose, Serum  234   BUN  23   Creatinine (comp) 0.87   Sodium, Serum 139   Potassium, Serum 3.8   Chloride, Serum 104   CO2, Serum 25   Calcium (Total), Serum 8.8   Anion Gap 10   Osmolality (calc) 289   eGFR (African American) >60   eGFR (Non-African American) >60 (eGFR values <695mmin/1.73 m2 may be an indication of chronic kidney disease (CKD). Calculated eGFR is useful in patients with stable renal function. The eGFR calculation will not be reliable in acutely ill patients when serum creatinine is changing rapidly. It is not useful in  patients on dialysis. The eGFR calculation may not be applicable to patients at the low and high extremes of body sizes, pregnant women, and vegetarians.)   Cholesterol, Serum 199   Triglycerides, Serum 136   HDL (INHOUSE)  38   VLDL Cholesterol Calculated 27   LDL Cholesterol Calculated  134 (Result(s) reported on 15 Apr 2012 at 03:15AM.)  Cardiac:  13-Jan-14 01:50    Troponin I < 0.02 (0.00-0.05 0.05 ng/mL or less: NEGATIVE  Repeat testing in 3-6 hrs  if clinically indicated. >0.05 ng/mL: POTENTIAL  MYOCARDIAL INJURY. Repeat  testing in 3-6 hrs if  clinically indicated. NOTE: An increase or decrease  of 30% or more on serial  testing suggests a  clinically important  change)  Routine Hem:  13-Jan-14 01:50    WBC (CBC) 8.2   RBC (CBC)  3.61   Hemoglobin (CBC) 12.2   Hematocrit (CBC) 36.1   Platelet Count (CBC) 232   MCV 100   MCH 33.9   MCHC 33.9   RDW 12.6   Neutrophil % 87.2   Lymphocyte % 10.9   Monocyte % 1.5   Eosinophil % 0.0   Basophil % 0.4   Neutrophil #  7.2   Lymphocyte #  0.9   Monocyte #  0.1   Eosinophil # 0.0   Basophil # 0.0 (Result(s) reported on 15 Apr 2012 at 02:27AM.)   EKG:   Interpretation EKG shows atrial fibrillation with rate 139  bpm, nonspcific ST abn    Codeine: Rash  OPIATES: Rash  Vytorin: GI Distress, Other  NSAIDS: Rash  Shellfish: GI Distress  IVP Dye: Other  Vital Signs/Nurse's Notes: **Vital Signs.:   13-Jan-14 08:00   Vital Signs Type Routine   Temperature Temperature (F) 97.3   Celsius 36.2   Temperature Source oral   Pulse Pulse 114   Respirations Respirations 22   Systolic BP Systolic BP 767   Diastolic BP (mmHg) Diastolic BP (mmHg) 68   Mean BP 86   Pulse Ox % Pulse Ox % 94   Oxygen Delivery 2L   Pulse Ox Heart Rate 102     Impression 81 year old female with PMH of Atrial fibrillation, syncope, coronary artery disease, anxiety, hyperlipidemia, hypertension and gout presenting with dizziness, tachycardia, URI sx. Cardiology was consulted for atrial fibrillation.  1)atrial fibrillation Suspect she has been in the rhythm for at least the past three days rate continues to be fast this AM,  metoprolol and diltiazem po started, increased Will add digoxin load 0.5 x 1 now, 0.25 tomorrow. Need to consider starting anticoagulation, could start xarelto 20 mg daily  2) Syncope: prior epsiodes, etiology uncertain. Atrial fib seems to be in setting of URI Prior workup showed no arrhythmia (though no syncope when wearing a 30 day monitor) She did not want implantable loop  3) URI: on abx possible bronchitis   Electronic Signatures: Ida Rogue (MD)  (Signed 13-Jan-14  08:48)  Authored: General Aspect/Present Illness, History and Physical Exam, Review of System, Health Issues, Home Medications, Labs, EKG , Allergies, Vital Signs/Nurse's Notes, Impression/Plan   Last Updated: 13-Jan-14 08:48 by Ida Rogue (MD)

## 2014-07-31 ENCOUNTER — Encounter: Payer: Self-pay | Admitting: Cardiology

## 2014-07-31 ENCOUNTER — Ambulatory Visit (INDEPENDENT_AMBULATORY_CARE_PROVIDER_SITE_OTHER): Payer: Medicare Other | Admitting: Cardiology

## 2014-07-31 VITALS — BP 172/90 | HR 93 | Ht 64.0 in | Wt 127.1 lb

## 2014-07-31 DIAGNOSIS — I10 Essential (primary) hypertension: Secondary | ICD-10-CM | POA: Diagnosis not present

## 2014-07-31 DIAGNOSIS — R55 Syncope and collapse: Secondary | ICD-10-CM

## 2014-07-31 DIAGNOSIS — I4891 Unspecified atrial fibrillation: Secondary | ICD-10-CM

## 2014-07-31 NOTE — Patient Instructions (Addendum)
Dr.Hochrein recommends that you schedule a follow-up appointment in: 6 months  .[facility] and discuss a loop recorder implant
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
## 2014-08-03 ENCOUNTER — Telehealth: Payer: Self-pay | Admitting: *Deleted

## 2014-08-03 DIAGNOSIS — M47812 Spondylosis without myelopathy or radiculopathy, cervical region: Secondary | ICD-10-CM | POA: Diagnosis not present

## 2014-08-03 DIAGNOSIS — M961 Postlaminectomy syndrome, not elsewhere classified: Secondary | ICD-10-CM | POA: Diagnosis not present

## 2014-08-03 DIAGNOSIS — Z79891 Long term (current) use of opiate analgesic: Secondary | ICD-10-CM | POA: Diagnosis not present

## 2014-08-03 DIAGNOSIS — G894 Chronic pain syndrome: Secondary | ICD-10-CM | POA: Diagnosis not present

## 2014-08-03 NOTE — Telephone Encounter (Signed)
Patient left a voicemail that she went to see her cardiologist last week and he does not understand why they can not get her blood pressure done. Patient stated that the cardiologist wants to put in a permanent EKG in her for 2 years and she is not interested in something like this and wants Dr. Nicanor AlconGutierrez's opinion.  Patient request a call back today after 3:00.

## 2014-08-04 NOTE — Telephone Encounter (Signed)
We have been unable to find cause of passing out - this is a reasonable next step.  I don't think it would necessarily have to be in place for 2 years. I would encourage her to consider having this done.

## 2014-08-04 NOTE — Telephone Encounter (Signed)
Patient notified and verbalized understanding. 

## 2014-08-18 ENCOUNTER — Institutional Professional Consult (permissible substitution): Payer: Medicare Other | Admitting: Internal Medicine

## 2014-08-20 ENCOUNTER — Other Ambulatory Visit: Payer: Self-pay | Admitting: Family Medicine

## 2014-08-20 NOTE — Telephone Encounter (Signed)
Rx called in as directed.   

## 2014-08-20 NOTE — Telephone Encounter (Signed)
plz phone in. 

## 2014-09-03 DIAGNOSIS — M4727 Other spondylosis with radiculopathy, lumbosacral region: Secondary | ICD-10-CM | POA: Diagnosis not present

## 2014-09-03 DIAGNOSIS — G894 Chronic pain syndrome: Secondary | ICD-10-CM | POA: Diagnosis not present

## 2014-09-03 DIAGNOSIS — M961 Postlaminectomy syndrome, not elsewhere classified: Secondary | ICD-10-CM | POA: Diagnosis not present

## 2014-09-03 DIAGNOSIS — Z79891 Long term (current) use of opiate analgesic: Secondary | ICD-10-CM | POA: Diagnosis not present

## 2014-09-07 ENCOUNTER — Ambulatory Visit: Payer: Medicare Other | Admitting: Family Medicine

## 2014-09-11 ENCOUNTER — Other Ambulatory Visit: Payer: Self-pay | Admitting: Cardiology

## 2014-09-18 DIAGNOSIS — G894 Chronic pain syndrome: Secondary | ICD-10-CM | POA: Diagnosis not present

## 2014-09-18 DIAGNOSIS — Z79891 Long term (current) use of opiate analgesic: Secondary | ICD-10-CM | POA: Diagnosis not present

## 2014-09-18 DIAGNOSIS — M4727 Other spondylosis with radiculopathy, lumbosacral region: Secondary | ICD-10-CM | POA: Diagnosis not present

## 2014-09-18 DIAGNOSIS — M961 Postlaminectomy syndrome, not elsewhere classified: Secondary | ICD-10-CM | POA: Diagnosis not present

## 2014-09-24 ENCOUNTER — Ambulatory Visit (INDEPENDENT_AMBULATORY_CARE_PROVIDER_SITE_OTHER)
Admission: RE | Admit: 2014-09-24 | Discharge: 2014-09-24 | Disposition: A | Discharge status: Home / Self Care | Payer: Medicare Other | Source: Ambulatory Visit | Attending: Family Medicine | Admitting: Family Medicine

## 2014-09-24 ENCOUNTER — Encounter: Payer: Self-pay | Admitting: Family Medicine

## 2014-09-24 ENCOUNTER — Ambulatory Visit (INDEPENDENT_AMBULATORY_CARE_PROVIDER_SITE_OTHER): Payer: Medicare Other | Admitting: Family Medicine

## 2014-09-24 ENCOUNTER — Other Ambulatory Visit: Payer: Self-pay | Admitting: Family Medicine

## 2014-09-24 VITALS — BP 140/70 | HR 84 | Temp 98.5°F | Wt 129.0 lb

## 2014-09-24 DIAGNOSIS — M25511 Pain in right shoulder: Secondary | ICD-10-CM

## 2014-09-24 DIAGNOSIS — I10 Essential (primary) hypertension: Secondary | ICD-10-CM

## 2014-09-24 DIAGNOSIS — I481 Persistent atrial fibrillation: Secondary | ICD-10-CM

## 2014-09-24 DIAGNOSIS — R55 Syncope and collapse: Secondary | ICD-10-CM | POA: Diagnosis not present

## 2014-09-24 DIAGNOSIS — I4819 Other persistent atrial fibrillation: Secondary | ICD-10-CM

## 2014-09-24 DIAGNOSIS — M898X8 Other specified disorders of bone, other site: Secondary | ICD-10-CM | POA: Diagnosis not present

## 2014-09-24 NOTE — Assessment & Plan Note (Signed)
bp stable today. Continue current regimen. In setting of recurrent unexplained syncope, would aim for BP <150/90

## 2014-09-24 NOTE — Patient Instructions (Addendum)
xrays today. I think you have shoulder bursitis and frozen shoulder. Do mobility exercises provided today. Schedule tylenol 500mg  three times daily with meals. May use oxycodone for breakthrough pain. Ask Dr Antoine Poche about blood thinner.  Return for medicare wellness visit in 2-3 months.

## 2014-09-24 NOTE — Assessment & Plan Note (Addendum)
Anticipate contribution of significant cervical arthritis along with R shoulder bursitis and adhesive capsulitis. Check Xray to eval for arthritic burden and r/o trauma given fall history - no significant arthritis of shoulders but significant cervical spine. Discussed steroid injection vs PT vs orthopedic referral - pt hesitant for steroid shot today but may return for this if shoulder pain worsening.  rec scheduled tylenol 500mg  TID and use oxycodone for breakthrough pain.  Continue heating pad. Provided with exercises from [facility] pt advisor on shoulder bursitis and frozen shoulder.

## 2014-09-24 NOTE — Progress Notes (Signed)
BP 140/70 mmHg  Pulse 84  Temp(Src) 98.5 F (36.9 C) (Tympanic)  Wt 129 lb (58.514 kg)  SpO2 97%   CC: neck/shoulder pain  Subjective:    Patient ID: [name], female    DOB: 20-Sep-1932, 81 y.o.   MRN: [id number]  HPI: [name] is a 81 y.o. female presenting on 09/24/2014 for Neck Pain and Shoulder Pain   H/o spinal stenosis and bulging disks s/p lumbar laminectomy by Dr Dutch Quint 2005 and chronic cervical neck pain. Has had several syncopal episodes with falls with facial trauma in the last year. Endorses stable blood pressure readings as well as no recurrent falls since February. She has atrial fibrillation but is currently off anticoagulant. She has high CHADSvasc2 score and low HAS-BLED score but eliquis and pradaxa were too expensive.  Followed by pain clinic Dr Vear Clock. Recently filled prednisone course as well as has been filling #120 percocets monthly over last 3 months.   She presents today with severe R shoulder pain ongoing since Mother's day. Denies inciting trauma/falls or injury. Denies swelling or redness of shoulder. Also with 1 wk h/o R neck pain. Dr Vear Clock thinks this is arthritis related. She was recently treated with prednisone course without significant improvement. She has continued taking oxycodone 10mg  but only takes once daily. Any movement worsens R shoulder pain. Treating with heating pad which is helpful. She has filled oxycodone #120 for the past 3 months. Advised this is not a safe medicine to have extra pills laying around at home.   Relevant past medical, surgical, family and social history reviewed and updated as indicated. Interim medical history since our last visit reviewed. Allergies and medications reviewed and updated. Current Outpatient Prescriptions on File Prior to Visit  Medication Sig  . allopurinol (ZYLOPRIM) 100 MG tablet TAKE ONE TABLET EVERY DAY  . ALPRAZolam (XANAX) 0.25 MG tablet TAKE ONE TABLET BY MOUTH TWICE DAILY AS  NEEDED  . atorvastatin (LIPITOR) 40 MG tablet Take 40 mg by mouth every evening.  . digoxin (LANOXIN) 0.125 MG tablet Take 0.125 mg by mouth every morning.  Marland Kitchen lisinopril (PRINIVIL,ZESTRIL) 5 MG tablet TAKE ONE TABLET BY MOUTH EVERY DAY  . metoprolol tartrate (LOPRESSOR) 25 MG tablet TAKE ONE TABLET BY MOUTH TWICE DAILY  . NITROSTAT 0.4 MG SL tablet PLACE 1 TABLET UNDER TONGUE EVERY 5 MIN AS NEEDED FOR CHEST PAIN IF NO RELIEF IN15 MIN CALL 911 (MAX 3 TABS)  . Sertraline HCl (ZOLOFT PO) Take by mouth as needed (depression).   No current facility-administered medications on file prior to visit.    Review of Systems Per HPI unless specifically indicated above     Objective:    BP 140/70 mmHg  Pulse 84  Temp(Src) 98.5 F (36.9 C) (Tympanic)  Wt 129 lb (58.514 kg)  SpO2 97%  Wt Readings from Last 3 Encounters:  09/24/14 129 lb (58.514 kg)  07/31/14 127 lb 1.6 oz (57.652 kg)  05/26/14 128 lb 4 oz (58.174 kg)    Physical Exam  Constitutional: She appears well-developed and well-nourished. No distress.  Elderly female  Musculoskeletal: She exhibits no edema.  Exam very limited by pain TTP midline cervical spine and R paracervical mm Markedly decreased ROM on right 2/2 pain/stiffness both actively and passively TTP throughout R shoulder A/P/L, tender at subacromial bursa. No significant pain with testing SITS against resistance but again testing very limited.  Skin: No rash noted.  Psychiatric:  tangential  Nursing note and vitals reviewed.  Results for orders placed or performed in visit on 06/29/14  HM MAMMOGRAPHY  Result Value Ref Range   HM Mammogram Normal Birads 1-Repeat 1 year       Assessment & Plan:   Problem List Items Addressed This Visit    Atrial fibrillation    She is currently off anticoagulation. States eliquis and pradaxa unaffordable.  Asks about coumadin. I rec she touch base with cards for further recommendations.      Essential hypertension, benign      bp stable today. Continue current regimen. In setting of recurrent unexplained syncope, would aim for BP <150/90      Right shoulder pain - Primary    Anticipate contribution of significant cervical arthritis along with R shoulder bursitis and adhesive capsulitis. Check Xray to eval for arthritic burden and r/o trauma given fall history - no significant arthritis of shoulders but significant cervical spine. Discussed steroid injection vs PT vs orthopedic referral - pt hesitant for steroid shot today but may return for this if shoulder pain worsening.  rec scheduled tylenol  TID and use oxycodone for breakthrough pain.  Continue heating pad. Provided with exercises from [facility] pt advisor on shoulder bursitis and frozen shoulder.      Relevant Orders   DG Cervical Spine Complete   DG Shoulder Right   Syncope    Actually states no syncope or falls in 4 months.          Follow up plan: Return in about 3 months (around 12/25/2014), or as needed, for medicare wellness visit.

## 2014-09-24 NOTE — Progress Notes (Signed)
Pre visit review using our clinic review tool, if applicable. No additional management support is needed unless otherwise documented below in the visit note. 

## 2014-09-24 NOTE — Assessment & Plan Note (Signed)
Actually states no syncope or falls in 4 months.

## 2014-09-24 NOTE — Assessment & Plan Note (Addendum)
She is currently off anticoagulation. States eliquis and pradaxa unaffordable.  Asks about coumadin. I rec she touch base with cards for further recommendations.

## 2014-09-30 ENCOUNTER — Other Ambulatory Visit: Payer: Self-pay | Admitting: Family Medicine

## 2014-09-30 NOTE — Telephone Encounter (Signed)
Ok to refill 

## 2014-10-01 NOTE — Telephone Encounter (Signed)
Rx called in as directed.   

## 2014-10-01 NOTE — Telephone Encounter (Signed)
plz phone in. 

## 2014-10-22 ENCOUNTER — Telehealth: Payer: Self-pay | Admitting: Cardiology

## 2014-10-22 DIAGNOSIS — I4819 Other persistent atrial fibrillation: Secondary | ICD-10-CM

## 2014-10-22 NOTE — Telephone Encounter (Signed)
Mrs Standlee returned your call , she said to leave a message or send something to her in the mail .Marland Kitchen Thanks

## 2014-10-22 NOTE — Telephone Encounter (Signed)
Outgoing call - left msg for patient to return call.  Per last OV note, Dr. Antoine Poche states to continue anticoagulation - I do not see coumadin or NOACs on her med list.

## 2014-10-22 NOTE — Telephone Encounter (Signed)
Mrs.Reichow is wanting to know if she should be on a blood thinner , her primary is saying that she should be on one . Please call   Thanks

## 2014-10-22 NOTE — Telephone Encounter (Signed)
Researched chart, apixiban was decreased from 5 mg bid to 2.5 mg bid (appropriately) by Dr. Sharen Hones in July 2015.  Cannot find indication to discontinue unless was held for colonoscopy that month and not resumed.

## 2014-10-22 NOTE — Telephone Encounter (Signed)
Left msg at available number per pt request - informed of recommendation on historic med. Will route to Dr. Sharen Hones to f/u.

## 2014-10-22 NOTE — Telephone Encounter (Signed)
Agree 

## 2014-10-23 NOTE — Telephone Encounter (Signed)
Given that message was left for patient to restart apixiban, this note can wait for PCP who is currently out of office.

## 2014-10-30 MED ORDER — APIXABAN 2.5 MG PO TABS: 2.5000 mg | ORAL_TABLET | Freq: Two times a day (BID) | ORAL | Status: DC

## 2014-10-30 NOTE — Telephone Encounter (Signed)
plz f/u with pt to ensure that she is taking apixiban. Refill sent to pharmacy. Call cardiology office if needs assistance with affording medication to see if they may have samples.

## 2014-11-03 ENCOUNTER — Other Ambulatory Visit: Payer: Self-pay | Admitting: *Deleted

## 2014-11-03 NOTE — Telephone Encounter (Signed)
Rx cancelled.

## 2014-11-03 NOTE — Telephone Encounter (Signed)
Noted. plz call and [facility].

## 2014-11-03 NOTE — Telephone Encounter (Signed)
Spoke with patient. She absolutely refuses the eliquis. She said she knows the med causes strokes and uncontrollable bleeding. She said she is doing good with her blood pressure and not having any chest pain. I advised that this wasn't a BP med and that it was to help prevent blood clots that can cause strokes due to her A-fib. She said she knew that and still refused to take it. She asked that it not be refilled and hung up the phone. Sent to Dr. G andReece Agar Dr. Antoine Poche.

## 2014-11-09 DIAGNOSIS — Z79891 Long term (current) use of opiate analgesic: Secondary | ICD-10-CM | POA: Diagnosis not present

## 2014-11-09 DIAGNOSIS — M4727 Other spondylosis with radiculopathy, lumbosacral region: Secondary | ICD-10-CM | POA: Diagnosis not present

## 2014-11-09 DIAGNOSIS — G894 Chronic pain syndrome: Secondary | ICD-10-CM | POA: Diagnosis not present

## 2014-11-09 DIAGNOSIS — M961 Postlaminectomy syndrome, not elsewhere classified: Secondary | ICD-10-CM | POA: Diagnosis not present

## 2014-11-17 ENCOUNTER — Other Ambulatory Visit: Payer: Self-pay | Admitting: Family Medicine

## 2014-11-17 NOTE — Telephone Encounter (Signed)
Electronically refill request  Last prescribed on 10/06/13. Last seen on acute on 09/24/14. No future apt.

## 2014-11-30 ENCOUNTER — Other Ambulatory Visit: Payer: Self-pay | Admitting: Family Medicine

## 2014-11-30 NOTE — Telephone Encounter (Signed)
plz phone in. 

## 2014-11-30 NOTE — Telephone Encounter (Signed)
Ok to refill 

## 2014-12-01 NOTE — Telephone Encounter (Signed)
Rx called in as directed.   

## 2014-12-08 DIAGNOSIS — M4727 Other spondylosis with radiculopathy, lumbosacral region: Secondary | ICD-10-CM | POA: Diagnosis not present

## 2014-12-08 DIAGNOSIS — M961 Postlaminectomy syndrome, not elsewhere classified: Secondary | ICD-10-CM | POA: Diagnosis not present

## 2014-12-08 DIAGNOSIS — Z79891 Long term (current) use of opiate analgesic: Secondary | ICD-10-CM | POA: Diagnosis not present

## 2014-12-08 DIAGNOSIS — G894 Chronic pain syndrome: Secondary | ICD-10-CM | POA: Diagnosis not present

## 2014-12-17 ENCOUNTER — Other Ambulatory Visit: Payer: Self-pay | Admitting: Family Medicine

## 2014-12-25 ENCOUNTER — Encounter: Payer: Self-pay | Admitting: Cardiology

## 2014-12-25 ENCOUNTER — Ambulatory Visit (INDEPENDENT_AMBULATORY_CARE_PROVIDER_SITE_OTHER): Payer: Medicare Other | Admitting: Cardiology

## 2014-12-25 VITALS — BP 152/74 | HR 56 | Ht 64.0 in | Wt 122.0 lb

## 2014-12-25 DIAGNOSIS — I2510 Atherosclerotic heart disease of native coronary artery without angina pectoris: Secondary | ICD-10-CM

## 2014-12-25 DIAGNOSIS — I481 Persistent atrial fibrillation: Secondary | ICD-10-CM

## 2014-12-25 DIAGNOSIS — I4819 Other persistent atrial fibrillation: Secondary | ICD-10-CM

## 2014-12-25 MED ORDER — FUROSEMIDE 20 MG PO TABS: 10.0000 mg | ORAL_TABLET | Freq: Every day | ORAL | Status: DC

## 2014-12-25 NOTE — Patient Instructions (Signed)
Your physician wants you to follow-up in: 6 Months. You will receive a reminder letter in the mail two months in advance. If you don't receive a letter, please call our office to schedule the follow-up appointment.  Your physician has recommended you make the following change in your medication: Start Furosemide ( lasix) 10 mg daily

## 2014-12-25 NOTE — Progress Notes (Signed)
HPI The patient presents for followup of atrial fibrillation and syncope.  She has been on anticoagulation.  However, after the last visit she called and said she refused to take her Eliquis. The risks were discussed with her. She adamantly refuses anticoagulation.  Since I last saw her she has had no further syncopal episodes. She has had occasional bouts of fibrillation with her heart racing. It might last for 5 minutes at a time.  She'll take nitroglycerin occasionally. It will pass spontaneously. She's not describing any presyncope. She's had no new shortness of breath, PND or orthopnea. She's had no weight gain or edema. Back she's been losing weight.  Allergies  Allergen Reactions  . Adhesive [Tape] Rash  . Ivp Dye [Iodinated Diagnostic Agents] Other (See Comments)    Rash and turns purple  . Vytorin [Ezetimibe-Simvastatin] Other (See Comments)    leg cramps    Current Outpatient Prescriptions  Medication Sig Dispense Refill  . allopurinol (ZYLOPRIM) 100 MG tablet TAKE ONE TABLET EVERY DAY 30 tablet 3  . ALPRAZolam (XANAX) 0.25 MG tablet TAKE ONE TABLET TWICE DAILY 60 tablet 0  . atorvastatin (LIPITOR) 40 MG tablet Take 40 mg by mouth every evening.    . digoxin (LANOXIN) 0.125 MG tablet Take 0.125 mg by mouth every morning.    Marland Kitchen FLUoxetine (PROZAC) 20 MG capsule Take 20 mg by mouth daily.     Marland Kitchen FLUoxetine (PROZAC) 20 MG capsule TAKE 1 CAPSULE BY MOUTH ONCE DAILY 30 capsule 3  . lisinopril (PRINIVIL,ZESTRIL) 5 MG tablet TAKE ONE TABLET BY MOUTH EVERY DAY 30 tablet 3  . metoprolol tartrate (LOPRESSOR) 25 MG tablet TAKE ONE TABLET BY MOUTH TWICE DAILY 60 tablet 3  . Multiple Vitamins-Minerals (ONE-A-DAY WOMENS 50+ ADVANTAGE) TABS Take by mouth daily.    Marland Kitchen NITROSTAT 0.4 MG SL tablet PLACE 1 TABLET UNDER TONGUE EVERY 5 MIN AS NEEDED FOR CHEST PAIN IF NO RELIEF IN15 MIN CALL 911 (MAX 3 TABS) 25 tablet 5  . Oxycodone HCl 10 MG TABS Take by mouth daily.     . sertraline (ZOLOFT) 25 MG  tablet TAKE ONE TABLET BY MOUTH EVERY DAY 30 tablet 6   No current facility-administered medications for this visit.    Past Medical History  Diagnosis Date  . Dyslipidemia   . Depression with anxiety     celexa started 04/2010  . Hyperlipidemia   . Hypertension   . CAD (coronary artery disease)     95% LAD stenosis, [place] x 02 September 1996  . Atrial fibrillation   . Prediabetes 2001  . Cervicalgia   . LBP (low back pain) 2005    spinal stenosis and bulging discs at L2-L5 s/p lumbar laminectomy Dutch Quint)  . Osteopenia 07/2013    T score -2.2 L femur  . Gout     prior PCP stopped allopurinol  . CHF (congestive heart failure) 2014    on recent hospitalization  . Arthritis   . Syncopal episodes 2015    orthostatic, normal EEG and neuro eval ([facility])  . T12 compression fracture 2015    by CT  . Atherosclerosis of abdominal aorta   . Peripheral neuropathy since feb 2015    right arm   . Lymphocytic colitis 10/28/2013    Past Surgical History  Procedure Laterality Date  . Lumbar laminectomy  2005    Dr. Dutch Quint  . Tonsillectomy    . Cardiac catheterization    . Percutaneous coronary stent intervention (pci-s)  stent x3 in heart  . Cataract extraction Bilateral   . Dexa  07/2013    T score -2.2 L femur  . Colonoscopy with propofol N/A 10/16/2013    Rachael Fee, MD    ROS:  Occasional left arm numbness.  Otherwise as stated in the HPI and negative for all other systems.  PHYSICAL EXAM BP 152/74 mmHg  Pulse 56  Ht  (1.626 m)  Wt 122 lb (55.339 kg)  BMI 20.93 kg/m2 GENERAL:  Well appearing NECK:  No jugular venous distention, waveform within normal limits, carotid upstroke brisk and symmetric, no bruits, no thyromegaly LUNGS:  Clear to auscultation bilaterally BACK:  No CVA tenderness but tender to palpation over the left chest at the site of her rash. HEART:  PMI not displaced or sustained,S1 and S2 within normal limits, no S3, no clicks, no rubs, no murmurs,  irregular ABD:  Flat, positive bowel sounds normal in frequency in pitch, no bruits, no rebound, no guarding, no midline pulsatile mass, no hepatomegaly, no splenomegaly EXT:  2 plus pulses throughout, trace edema, no cyanosis no clubbing NEURO:  Non focal   ASSESSMENT AND PLAN  DIASTOLIC HF:   She seems to be euvolemic.  She was taken off of diuretic because of her syncope.  However, she has some occasional lower extremity edema and I will give her 10 mg when necessary Lasix.   CAD -  She had a negative stress perfusion study in 2013  No testing is indicated at this point.   ESSENTIAL HYPERTENSION, BENIGN -  The blood pressure is slightly elevated but she forgot to take her blood pressure medicines this morning. Is otherwise been well controlled. No change in therapy is indicated.  ATRIAL FIBRILLATION -  Ms. KENZLEI RUNIONS has a CHA2DS2 - VASc score of 5 with a risk of stroke of 6.7%  . She refuses to take medications for this. She should be on aspirin.  SYNCOPE - She's had no further events. Further workup is planned.

## 2014-12-26 ENCOUNTER — Telehealth: Payer: Self-pay | Admitting: Family Medicine

## 2014-12-26 MED ORDER — ASPIRIN EC 81 MG PO TBEC: 81.0000 mg | DELAYED_RELEASE_TABLET | Freq: Every day | ORAL | Status: DC

## 2014-12-26 NOTE — Telephone Encounter (Signed)
Plz notify I received Dr Hochrein's last office note. I would like her to start aspirin  daily.

## 2014-12-28 NOTE — Telephone Encounter (Signed)
Patient notified

## 2014-12-29 ENCOUNTER — Other Ambulatory Visit: Payer: Self-pay | Admitting: Family Medicine

## 2015-01-05 DIAGNOSIS — Z79891 Long term (current) use of opiate analgesic: Secondary | ICD-10-CM | POA: Diagnosis not present

## 2015-01-05 DIAGNOSIS — M961 Postlaminectomy syndrome, not elsewhere classified: Secondary | ICD-10-CM | POA: Diagnosis not present

## 2015-01-05 DIAGNOSIS — M4727 Other spondylosis with radiculopathy, lumbosacral region: Secondary | ICD-10-CM | POA: Diagnosis not present

## 2015-01-05 DIAGNOSIS — G894 Chronic pain syndrome: Secondary | ICD-10-CM | POA: Diagnosis not present

## 2015-01-13 ENCOUNTER — Telehealth: Payer: Self-pay | Admitting: Family Medicine

## 2015-01-13 NOTE — Telephone Encounter (Signed)
Patient Name: Talbot GrumblingLIZABETH Gawron  DOB: 02/06/1933    Initial Comment Caller states she is having severe shoulder pain. Cannot even turn her head. Pain radiates from bottom of neck to right elbow.    Nurse Assessment  Nurse: Sherilyn CooterHenry, RN, Thurmond ButtsWade Date/Time Lamount Cohen(Eastern Time): 01/13/2015 10:47:44 AM  Confirm and document reason for call. If symptomatic, describe symptoms. ---Caller states that she has severe shoulder pain which began 2 days ago. She rates her pain as 10 on 0-10 scale. She took Tylenol but it didn't help her pain at all. She states that she cannot turn her head. Denies fever.  Has the patient traveled out of the country within the last 30 days? ---No  Does the patient have any new or worsening symptoms? ---Yes  Will a triage be completed? ---Yes  Related visit to physician within the last 2 weeks? ---No  Does the PT have any chronic conditions? (i.e. diabetes, asthma, etc.) ---Yes  List chronic conditions. ---HTN, CAD, AFib     Guidelines    Guideline Title Affirmed Question Affirmed Notes  Shoulder Pain [1] Age > 40 AND [2] no obvious cause AND [3] pain even when not moving the arm (Exception: pain is clearly made worse by moving arm or bending neck)    Final Disposition User   Go to ED Now Sherilyn CooterHenry, RN, Wade    [facility]   Disagree/Comply: Comply

## 2015-01-14 NOTE — Telephone Encounter (Signed)
Attempted to call patient. No answer, no machine. Will try again later.  

## 2015-01-14 NOTE — Telephone Encounter (Signed)
I don't see where pt went to ER for eval. plz call for update today.

## 2015-01-15 ENCOUNTER — Other Ambulatory Visit: Payer: Self-pay | Admitting: Family Medicine

## 2015-01-15 ENCOUNTER — Encounter: Payer: Medicare Other | Admitting: Primary Care

## 2015-01-15 NOTE — Telephone Encounter (Signed)
Attempted to call patient. No answer, no machine. Will try again later.  

## 2015-01-15 NOTE — Telephone Encounter (Signed)
Spoke with patient. She said she didn't go for eval bc she didn't feel like waiting in the waiting room. She said she was still hurting but was using ice and heat. I scheduled her an appt today with Jae DireKate and when she realized kate was a NP and not a MD, she said she didn't want anyone other than a MD messing with her and to just forget it and hung up on me.

## 2015-01-15 NOTE — Telephone Encounter (Signed)
Ok to refill 

## 2015-01-15 NOTE — Telephone Encounter (Signed)
Patient returned [name]'s call. °

## 2015-01-17 NOTE — Telephone Encounter (Signed)
plz phone in. 

## 2015-01-18 NOTE — Telephone Encounter (Signed)
Rx called in as directed.   

## 2015-01-21 ENCOUNTER — Other Ambulatory Visit: Payer: Self-pay | Admitting: Family Medicine

## 2015-01-27 ENCOUNTER — Ambulatory Visit: Payer: Medicare Other | Admitting: Cardiology

## 2015-01-28 ENCOUNTER — Other Ambulatory Visit: Payer: Self-pay | Admitting: Family Medicine

## 2015-02-02 DIAGNOSIS — M961 Postlaminectomy syndrome, not elsewhere classified: Secondary | ICD-10-CM | POA: Diagnosis not present

## 2015-02-02 DIAGNOSIS — G894 Chronic pain syndrome: Secondary | ICD-10-CM | POA: Diagnosis not present

## 2015-02-02 DIAGNOSIS — M4727 Other spondylosis with radiculopathy, lumbosacral region: Secondary | ICD-10-CM | POA: Diagnosis not present

## 2015-02-02 DIAGNOSIS — Z79891 Long term (current) use of opiate analgesic: Secondary | ICD-10-CM | POA: Diagnosis not present

## 2015-02-08 ENCOUNTER — Other Ambulatory Visit: Payer: Self-pay | Admitting: Family Medicine

## 2015-02-22 ENCOUNTER — Other Ambulatory Visit: Payer: Self-pay | Admitting: Family Medicine

## 2015-02-22 NOTE — Telephone Encounter (Signed)
plz phone in. 

## 2015-02-23 NOTE — Telephone Encounter (Signed)
Rx called in as directed.   

## 2015-03-02 ENCOUNTER — Other Ambulatory Visit: Payer: Self-pay | Admitting: Cardiology

## 2015-03-02 ENCOUNTER — Other Ambulatory Visit: Payer: Self-pay | Admitting: Family Medicine

## 2015-03-05 DIAGNOSIS — Z79891 Long term (current) use of opiate analgesic: Secondary | ICD-10-CM | POA: Diagnosis not present

## 2015-03-05 DIAGNOSIS — M961 Postlaminectomy syndrome, not elsewhere classified: Secondary | ICD-10-CM | POA: Diagnosis not present

## 2015-03-05 DIAGNOSIS — M4727 Other spondylosis with radiculopathy, lumbosacral region: Secondary | ICD-10-CM | POA: Diagnosis not present

## 2015-03-05 DIAGNOSIS — G894 Chronic pain syndrome: Secondary | ICD-10-CM | POA: Diagnosis not present

## 2015-04-05 ENCOUNTER — Other Ambulatory Visit: Payer: Self-pay | Admitting: Family Medicine

## 2015-04-05 DIAGNOSIS — I5032 Chronic diastolic (congestive) heart failure: Secondary | ICD-10-CM

## 2015-04-05 DIAGNOSIS — I4819 Other persistent atrial fibrillation: Secondary | ICD-10-CM

## 2015-04-05 DIAGNOSIS — I10 Essential (primary) hypertension: Secondary | ICD-10-CM

## 2015-04-05 DIAGNOSIS — E785 Hyperlipidemia, unspecified: Secondary | ICD-10-CM

## 2015-04-05 DIAGNOSIS — R7303 Prediabetes: Secondary | ICD-10-CM

## 2015-04-05 DIAGNOSIS — M109 Gout, unspecified: Secondary | ICD-10-CM

## 2015-04-05 DIAGNOSIS — E039 Hypothyroidism, unspecified: Secondary | ICD-10-CM

## 2015-04-06 ENCOUNTER — Other Ambulatory Visit (INDEPENDENT_AMBULATORY_CARE_PROVIDER_SITE_OTHER): Payer: PPO

## 2015-04-06 DIAGNOSIS — R7303 Prediabetes: Secondary | ICD-10-CM | POA: Diagnosis not present

## 2015-04-06 DIAGNOSIS — E785 Hyperlipidemia, unspecified: Secondary | ICD-10-CM

## 2015-04-06 DIAGNOSIS — I5032 Chronic diastolic (congestive) heart failure: Secondary | ICD-10-CM

## 2015-04-06 DIAGNOSIS — E039 Hypothyroidism, unspecified: Secondary | ICD-10-CM | POA: Diagnosis not present

## 2015-04-06 DIAGNOSIS — I10 Essential (primary) hypertension: Secondary | ICD-10-CM

## 2015-04-06 DIAGNOSIS — M109 Gout, unspecified: Secondary | ICD-10-CM | POA: Diagnosis not present

## 2015-04-06 LAB — HEMOGLOBIN A1C: Hgb A1c MFr Bld: 5.3 % (ref 4.6–6.5)

## 2015-04-06 LAB — LIPID PANEL
CHOLESTEROL: 163 mg/dL (ref 0–200)
HDL: 55.7 mg/dL (ref >39.00)
LDL CALC: 85 mg/dL (ref 0–99)
NonHDL: 107.69
Total CHOL/HDL Ratio: 3
Triglycerides: 113 mg/dL (ref 0.0–149.0)
VLDL: 22.6 mg/dL (ref 0.0–40.0)

## 2015-04-06 LAB — BASIC METABOLIC PANEL
BUN: 12 mg/dL (ref 6–23)
CALCIUM: 9.8 mg/dL (ref 8.4–10.5)
CO2: 31 mEq/L (ref 19–32)
CREATININE: 0.74 mg/dL (ref 0.40–1.20)
Chloride: 104 mEq/L (ref 96–112)
GFR: 80.09 mL/min (ref >60.00)
GLUCOSE: 102 mg/dL — AB (ref 70–99)
Potassium: 4 mEq/L (ref 3.5–5.1)
SODIUM: 145 meq/L (ref 135–145)

## 2015-04-06 LAB — TSH: TSH: 3.77 u[IU]/mL (ref 0.35–4.50)

## 2015-04-06 LAB — URIC ACID: Uric Acid, Serum: 6.5 mg/dL (ref 2.4–7.0)

## 2015-04-07 LAB — DIGOXIN LEVEL: DIGOXIN LVL: 0.7 ug/L — AB (ref 0.8–2.0)

## 2015-04-13 ENCOUNTER — Encounter: Payer: Self-pay | Admitting: Family Medicine

## 2015-04-13 ENCOUNTER — Ambulatory Visit (INDEPENDENT_AMBULATORY_CARE_PROVIDER_SITE_OTHER): Payer: PPO | Admitting: Family Medicine

## 2015-04-13 VITALS — BP 140/80 | HR 88 | Temp 98.2°F | Ht 63.0 in | Wt 126.0 lb

## 2015-04-13 DIAGNOSIS — I2510 Atherosclerotic heart disease of native coronary artery without angina pectoris: Secondary | ICD-10-CM

## 2015-04-13 DIAGNOSIS — I481 Persistent atrial fibrillation: Secondary | ICD-10-CM | POA: Diagnosis not present

## 2015-04-13 DIAGNOSIS — Z23 Encounter for immunization: Secondary | ICD-10-CM

## 2015-04-13 DIAGNOSIS — R7303 Prediabetes: Secondary | ICD-10-CM

## 2015-04-13 DIAGNOSIS — H9193 Unspecified hearing loss, bilateral: Secondary | ICD-10-CM

## 2015-04-13 DIAGNOSIS — I4819 Other persistent atrial fibrillation: Secondary | ICD-10-CM

## 2015-04-13 DIAGNOSIS — F418 Other specified anxiety disorders: Secondary | ICD-10-CM

## 2015-04-13 DIAGNOSIS — M8580 Other specified disorders of bone density and structure, unspecified site: Secondary | ICD-10-CM

## 2015-04-13 DIAGNOSIS — Z7189 Other specified counseling: Secondary | ICD-10-CM

## 2015-04-13 DIAGNOSIS — E785 Hyperlipidemia, unspecified: Secondary | ICD-10-CM

## 2015-04-13 DIAGNOSIS — R21 Rash and other nonspecific skin eruption: Secondary | ICD-10-CM

## 2015-04-13 DIAGNOSIS — I5032 Chronic diastolic (congestive) heart failure: Secondary | ICD-10-CM

## 2015-04-13 DIAGNOSIS — Z0000 Encounter for general adult medical examination without abnormal findings: Secondary | ICD-10-CM

## 2015-04-13 DIAGNOSIS — M109 Gout, unspecified: Secondary | ICD-10-CM

## 2015-04-13 DIAGNOSIS — E039 Hypothyroidism, unspecified: Secondary | ICD-10-CM

## 2015-04-13 DIAGNOSIS — R296 Repeated falls: Secondary | ICD-10-CM

## 2015-04-13 DIAGNOSIS — I10 Essential (primary) hypertension: Secondary | ICD-10-CM

## 2015-04-13 DIAGNOSIS — R55 Syncope and collapse: Secondary | ICD-10-CM

## 2015-04-13 DIAGNOSIS — K52832 Lymphocytic colitis: Secondary | ICD-10-CM

## 2015-04-13 MED ORDER — LISINOPRIL 5 MG PO TABS: 5.0000 mg | ORAL_TABLET | Freq: Every day | ORAL | Status: DC

## 2015-04-13 MED ORDER — ALPRAZOLAM 0.25 MG PO TABS: 0.1250 mg | ORAL_TABLET | Freq: Every evening | ORAL | Status: DC | PRN

## 2015-04-13 NOTE — Patient Instructions (Addendum)
Prevnar today. Flu shot today.  Try permethrin cream for possible scabies - if no improvement in skin rash please return to see dermatologist.  You failed hearing test today - we will refer you to hearing doctor. Don't take any more sertraline. Just continue prozac.  Bring me copy of living will to update your chart. I'd like to set you up with home health for physical therapy and social work.  Return to see me in 3 months for follow up.

## 2015-04-13 NOTE — Progress Notes (Signed)
BP 140/80 mmHg  Pulse 88  Temp(Src) 98.2 F (36.8 C) (Oral)  Ht 5\' 3"  (1.6 m)  Wt 126 lb (57.153 kg)  BMI 22.33 kg/m2   CC: medicare wellness visit  Subjective:    Patient ID: [name], female    DOB: [birth year], 82 y.o.   MRN: [id number]  HPI: [name] is a 82 y.o. female presenting on 04/13/2015 for Annual Exam   Skin rash ongoing for 4 yrs - severe persistent pruritic dermatitis. Has seen 4 dermatologist. Today started vit E ointment. No sick contacts at home. No new lotions, detergents, soaps or shampoos.   Last saw Dr Antoine Poche 12/2014 - stable evaluation. On lasix 10mg  QD PRN leg swelling. Had negative stress perfusion study 2013. afib with high stroke risk but refuses anticoagulation. Takes aspirin daily.   Failed hearing screen again 2017, but declines further evaluation with audiology exam.  Passes vision screen 3+ fall risk - persistent syncopal episodes s/p extensive neurological and cardiac evaluation. ?TIAs - refuses anticoagulation.  Depression - PHQ2 = 1  Preventative: COLONOSCOPY WITH PROPOFOL lymphocytic colitis  10/16/2013 Rachael Fee, MD incomplete response to prednisone. Thought unreliable historian on last eval with GI 01/2014. Cervical cancer screening - has aged out.  Mammogram - 06/2014 normal.  DEXA - 07/2013 T -2.2 L femur Flu shot today Pneumovax 02/2013, prevnar today Shingles shot - discussed Tetanus - not recently Advanced directives: discussed, doesn't want prolonged life support. "I want to die natural death". Would want son to be HCPOA despite his mental health issues. Has living will at home, asked to bring [place] a copy.  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 Interim medical history since our last visit reviewed. Allergies and medications reviewed and  updated. Current Outpatient Prescriptions on File Prior to Visit  Medication Sig  . allopurinol (ZYLOPRIM) 100 MG tablet TAKE ONE TABLET EVERY DAY  . atorvastatin (LIPITOR) 40 MG tablet Take 40 mg by mouth every evening.  Marland Kitchen DIGOX 125 MCG tablet TAKE ONE TABLET EVERY DAY  . FLUoxetine (PROZAC) 20 MG capsule TAKE 1 CAPSULE BY MOUTH ONCE DAILY  . furosemide (LASIX) 20 MG tablet Take 0.5 tablets (10 mg total) by mouth daily.  . metoprolol tartrate (LOPRESSOR) 25 MG tablet TAKE ONE TABLET BY MOUTH TWICE DAILY  . Multiple Vitamins-Minerals (ONE-A-DAY WOMENS 50+ ADVANTAGE) TABS Take by mouth daily.  Marland Kitchen NITROSTAT 0.4 MG SL tablet PLACE 1 TABLET UNDER TONGUE EVERY 5 MIN AS NEEDED FOR CHEST PAIN IF NO RELIEF IN15 MIN CALL 911 (MAX 3 TABS)   No current facility-administered medications on file prior to visit.    Review of Systems Per HPI unless specifically indicated in ROS section     Objective:    BP 140/80 mmHg  Pulse 88  Temp(Src) 98.2 F (36.8 C) (Oral)  Ht 5\' 3"  (1.6 m)  Wt 126 lb (57.153 kg)  BMI 22.33 kg/m2  Wt Readings from Last 3 Encounters:  04/13/15 126 lb (57.153 kg)  12/25/14 122 lb (55.339 kg)  09/24/14 129 lb (58.514 kg)    Physical Exam  Constitutional: She is oriented to person, place, and time. She appears well-developed and well-nourished. No distress.  HENT:  Head: Normocephalic and atraumatic.  Right Ear: Hearing, tympanic membrane, external ear and ear canal normal.  Left Ear: Hearing, tympanic  membrane, external ear and ear canal normal.  Nose: Nose normal.  Mouth/Throat: Uvula is midline, oropharynx is clear and moist and mucous membranes are normal. No oropharyngeal exudate, posterior oropharyngeal edema or posterior oropharyngeal erythema.  Eyes: Conjunctivae and EOM are normal. Pupils are equal, round, and reactive to light. No scleral icterus.  Neck: Normal range of motion. Neck supple. Carotid bruit is not present. No thyromegaly present.  Cardiovascular:  Normal rate, normal heart sounds and intact distal pulses.  An irregularly irregular rhythm present.  No murmur heard. Pulses:      Radial pulses are 2+ on the right side, and 2+ on the left side.  Pulmonary/Chest: Effort normal and breath sounds normal. No respiratory distress. She has no wheezes. She has no rales.  Abdominal: Soft. Bowel sounds are normal. She exhibits no distension and no mass. There is no tenderness. There is no rebound and no guarding.  Musculoskeletal: Normal range of motion. She exhibits no edema.  Lymphadenopathy:    She has no cervical adenopathy.  Neurological: She is alert and oriented to person, place, and time.  CN grossly intact, station and gait intact  Skin: Skin is warm and dry. Rash noted.  Diffuse pruritic papular rash with excoriations throughout trunk, LLE>RLE, some spots on forearms  Psychiatric: She has a normal mood and affect. Her behavior is normal. Judgment and thought content normal.  Nursing note and vitals reviewed.  Results for orders placed or performed in visit on 04/06/15  Lipid panel  Result Value Ref Range   Cholesterol 163 0 - 200 mg/dL   Triglycerides 161.0 0.0 - 149.0 mg/dL   HDL 96.04 >54.09 mg/dL   VLDL 81.1 0.0 - 91.4 mg/dL   LDL Cholesterol 85 0 - 99 mg/dL   Total CHOL/HDL Ratio 3    NonHDL 107.69   Basic metabolic panel  Result Value Ref Range   Sodium 145 135 - 145 mEq/L   Potassium 4.0 3.5 - 5.1 mEq/L   Chloride 104 96 - 112 mEq/L   CO2 31 19 - 32 mEq/L   Glucose, Bld 102 (H) 70 - 99 mg/dL   BUN 12 6 - 23 mg/dL   Creatinine, Ser 7.82 0.40 - 1.20 mg/dL   Calcium 9.8 8.4 - 95.6 mg/dL   GFR 21.30 >86.57 mL/min  Uric acid  Result Value Ref Range   Uric Acid, Serum 6.5 2.4 - 7.0 mg/dL  Hemoglobin Q4O  Result Value Ref Range   Hgb A1c MFr Bld 5.3 4.6 - 6.5 %  TSH  Result Value Ref Range   TSH 3.77 0.35 - 4.50 uIU/mL  Digoxin level  Result Value Ref Range   Digoxin Level 0.7 (L) 0.8 - 2.0 ug/L      Assessment &  Plan:   Problem List Items Addressed This Visit    Syncope    Ongoing, ?TIAs. Start coumadin in h/o afib.       Relevant Medications   lisinopril (PRINIVIL,ZESTRIL) 5 MG tablet   aspirin EC 81 MG tablet   Recurrent falls    3 in the last year. Unclear etiology. Has had unrevealing cardiology evaluation. Has seen neurology. Recommended HH PT and home safety evaluation. Pt declines. Also declines outpt PT referral for fall prevention program and balance training "will think about it"      Rash and nonspecific skin eruption    Ongoing pruritic rash for at least 4 yrs, states has had multiple unrevealing dermatologist evaluations. ?scabies given extent and pruritis - trial  permethrin 5% cream. If persistent, rec f/u with derm. Pt agrees.      RESOLVED: Prediabetes    A1c now in normal range. Will remove this problem from list.      Osteopenia    Reviewed latest dexa with patient, rec rpt 2 yrs given T score.       Medicare annual wellness visit, initial - Primary    I have personally reviewed the Medicare Annual Wellness questionnaire and have noted 1. The patient's medical and social history 2. Their use of alcohol, tobacco or illicit drugs 3. Their current medications and supplements 4. The patient's functional ability including ADL's, fall risks, home safety risks and hearing or visual impairment. Cognitive function has been assessed and addressed as indicated.  5. Diet and physical activity 6. Evidence for depression or mood disorders The patients weight, height, BMI have been recorded in the chart. I have made referrals, counseling and provided education to the patient based on review of the above and I have provided the pt with a written personalized care plan for preventive services. Provider list updated.. See scanned questionairre as needed for further documentation. Reviewed preventative protocols and updated unless pt declined.       Lymphocytic colitis    Did not f/u  with GI. Presumed related to sertraline. She has continued this along with prozac. Advised STOP sertraline, med removed from her medication bag and discarded today.      Hypothyroidism    TSH stable off levothyroxine. Continue to monitor.       HLD (hyperlipidemia)    Chronic, stable. Continue atorvastatin.      Relevant Medications   lisinopril (PRINIVIL,ZESTRIL) 5 MG tablet   aspirin EC 81 MG tablet   Gout    Chronic, stable, asxs on allopurinol low dose. Continue.      Essential hypertension, benign    Chronic, stable. continue current regimen.      Relevant Medications   lisinopril (PRINIVIL,ZESTRIL) 5 MG tablet   aspirin EC 81 MG tablet   Depression with anxiety    PHQ9 = 14/27. On 2 SSRIs by accident rec continue prozac, stop sertraline.       Coronary atherosclerosis    Continue aspirin for now.       Relevant Medications   lisinopril (PRINIVIL,ZESTRIL) 5 MG tablet   aspirin EC 81 MG tablet   Chronic diastolic CHF (congestive heart failure) (HCC)    Seems euvolemic. Continue current regimen      Relevant Medications   lisinopril (PRINIVIL,ZESTRIL) 5 MG tablet   aspirin EC 81 MG tablet   Atrial fibrillation (HCC)    NOAC refused, unaffordable. Interested in coumadin. High falls risk but also high stroke risk. Will start coumadin 2mg  daily and pt will go to coumadin clinic on Monday to establish.  Goal INR 2-2.5 given fall risk.      Relevant Medications   lisinopril (PRINIVIL,ZESTRIL) 5 MG tablet   aspirin EC 81 MG tablet   Advanced care planning/counseling discussion    Advanced directives: discussed, doesn't want prolonged life support. "I want to die natural death". Would want son to be HCPOA despite his mental health issues. Has living will at home, asked to bring us a copy.       Other Visit Diagnoses    Need for influenza vaccination        Relevant Orders    Flu Vaccine QUAD 36+ mos PF IM (Fluarix & Fluzone Quad PF) (Completed)    Need for  vaccination with 13-polyvalent pneumococcal conjugate vaccine        Relevant Orders    Pneumococcal conjugate vaccine 13-valent (Completed)    Hearing loss, bilateral        Relevant Orders    Ambulatory referral to Audiology        Follow up plan: Return in about 3 months (around 07/12/2015), or as needed.

## 2015-04-13 NOTE — Progress Notes (Signed)
Pre visit review using our clinic review tool, if applicable. No additional management support is needed unless otherwise documented below in the visit note. 

## 2015-04-14 ENCOUNTER — Encounter: Payer: Self-pay | Admitting: Family Medicine

## 2015-04-14 ENCOUNTER — Telehealth: Payer: Self-pay | Admitting: Family Medicine

## 2015-04-14 DIAGNOSIS — Z7189 Other specified counseling: Secondary | ICD-10-CM | POA: Insufficient documentation

## 2015-04-14 DIAGNOSIS — R296 Repeated falls: Secondary | ICD-10-CM | POA: Insufficient documentation

## 2015-04-14 MED ORDER — ASPIRIN EC 81 MG PO TBEC: 81.0000 mg | DELAYED_RELEASE_TABLET | Freq: Every day | ORAL | Status: AC

## 2015-04-14 MED ORDER — PERMETHRIN 5 % EX CREA: 1.0000 "application " | TOPICAL_CREAM | Freq: Once | CUTANEOUS | Status: DC

## 2015-04-14 MED ORDER — WARFARIN SODIUM 2 MG PO TABS: 2.0000 mg | ORAL_TABLET | Freq: Every day | ORAL | Status: DC

## 2015-04-14 NOTE — Telephone Encounter (Signed)
Left message for patient to return call.

## 2015-04-14 NOTE — Assessment & Plan Note (Signed)

## 2015-04-14 NOTE — Assessment & Plan Note (Signed)
Continue aspirin for now.

## 2015-04-14 NOTE — Assessment & Plan Note (Addendum)
Advanced directives: discussed, doesn't want prolonged life support. "I want to die natural death". Would want son to be HCPOA despite his mental health issues. Has living will at home, asked to bring us a copy.  

## 2015-04-14 NOTE — Assessment & Plan Note (Addendum)
NOAC refused, unaffordable. Interested in coumadin. High falls risk but also high stroke risk. Will start coumadin 2mg  daily and pt will go to coumadin clinic on Monday to establish.  Goal INR 2-2.5 given fall risk.

## 2015-04-14 NOTE — Telephone Encounter (Addendum)
Plz call patient - let's start coumadin 2mg  daily first dose on Thursday evening through weekend and will check coumadin levels on Monday at scheduled appointment.  Continue aspirin for now.  Goal INR 2-2.5 given fall risk.

## 2015-04-14 NOTE — Assessment & Plan Note (Signed)
PHQ9 = 14/27. On 2 SSRIs by accident rec continue prozac, stop sertraline.

## 2015-04-14 NOTE — Assessment & Plan Note (Signed)
A1c now in normal range. Will remove this problem from list.

## 2015-04-14 NOTE — Telephone Encounter (Signed)
Patient called to let Dr.Gutierrez know she has an appointment at [facility] tomorrow at 4:00.

## 2015-04-14 NOTE — Assessment & Plan Note (Signed)
Ongoing pruritic rash for at least 4 yrs, states has had multiple unrevealing dermatologist evaluations. ?scabies given extent and pruritis - trial permethrin 5% cream. If persistent, rec f/u with derm. Pt agrees.

## 2015-04-14 NOTE — Assessment & Plan Note (Signed)
Chronic, stable continue current regimen.  

## 2015-04-14 NOTE — Assessment & Plan Note (Signed)
3 in the last year. Unclear etiology. Has had unrevealing cardiology evaluation. Has seen neurology. Recommended HH PT and home safety evaluation. Pt declines. Also declines outpt PT referral for fall prevention program and balance training "will think about it"

## 2015-04-14 NOTE — Assessment & Plan Note (Signed)
Reviewed latest dexa with patient, rec rpt 2 yrs given T score.

## 2015-04-14 NOTE — Assessment & Plan Note (Addendum)
Seems euvolemic. Continue current regimen

## 2015-04-14 NOTE — Assessment & Plan Note (Signed)
TSH stable off levothyroxine. Continue to monitor.

## 2015-04-14 NOTE — Assessment & Plan Note (Signed)
Did not f/u with GI. Presumed related to sertraline. She has continued this along with prozac. Advised STOP sertraline, med removed from her medication bag and discarded today.

## 2015-04-14 NOTE — Assessment & Plan Note (Signed)
Chronic, stable, asxs on allopurinol low dose. Continue.

## 2015-04-14 NOTE — Assessment & Plan Note (Addendum)
Ongoing, ?TIAs. Start coumadin in h/o afib.

## 2015-04-14 NOTE — Assessment & Plan Note (Signed)
Chronic, stable Continue atorvastatin 

## 2015-04-15 NOTE — Telephone Encounter (Signed)
Patient notified of instructions and she verbalizes understanding.  Will check INR at Coumadin clinic on 01/16 and make necessary adjustments at that time.

## 2015-04-15 NOTE — Telephone Encounter (Signed)
Noted! Thank you

## 2015-04-15 NOTE — Telephone Encounter (Signed)
Left message for patient to return call.

## 2015-04-19 ENCOUNTER — Ambulatory Visit (INDEPENDENT_AMBULATORY_CARE_PROVIDER_SITE_OTHER): Payer: PPO

## 2015-04-19 ENCOUNTER — Ambulatory Visit (INDEPENDENT_AMBULATORY_CARE_PROVIDER_SITE_OTHER): Payer: PPO | Admitting: *Deleted

## 2015-04-19 DIAGNOSIS — I481 Persistent atrial fibrillation: Secondary | ICD-10-CM

## 2015-04-19 DIAGNOSIS — Z5181 Encounter for therapeutic drug level monitoring: Secondary | ICD-10-CM | POA: Diagnosis not present

## 2015-04-19 DIAGNOSIS — I4819 Other persistent atrial fibrillation: Secondary | ICD-10-CM

## 2015-04-19 LAB — POCT INR: INR: 1.4

## 2015-04-19 NOTE — Progress Notes (Signed)
Pre visit review using our clinic review tool, if applicable. No additional management support is needed unless otherwise documented below in the visit note. 

## 2015-04-26 ENCOUNTER — Ambulatory Visit (INDEPENDENT_AMBULATORY_CARE_PROVIDER_SITE_OTHER): Payer: PPO | Admitting: *Deleted

## 2015-04-26 ENCOUNTER — Ambulatory Visit: Payer: PPO

## 2015-04-26 DIAGNOSIS — I481 Persistent atrial fibrillation: Secondary | ICD-10-CM

## 2015-04-26 DIAGNOSIS — Z5181 Encounter for therapeutic drug level monitoring: Secondary | ICD-10-CM | POA: Diagnosis not present

## 2015-04-26 DIAGNOSIS — I4819 Other persistent atrial fibrillation: Secondary | ICD-10-CM

## 2015-04-26 LAB — POCT INR: INR: 1.5

## 2015-04-26 NOTE — Progress Notes (Signed)
Pre visit review using our clinic review tool, if applicable. No additional management support is needed unless otherwise documented below in the visit note. 

## 2015-04-26 NOTE — Progress Notes (Signed)
Patient started on Coumadin 2 mg daily on 04/15/15.  INR still subtherapeutic today, goal 2.0-2.5.  Dose increased and patient to begin taking 2 mg daily except 3 mg on Mondays and Fridays.  Discussed appropriate diet and foods to avoid while on Coumadin.  Will recheck INR in one week.

## 2015-05-03 ENCOUNTER — Ambulatory Visit: Payer: PPO

## 2015-05-03 ENCOUNTER — Ambulatory Visit (INDEPENDENT_AMBULATORY_CARE_PROVIDER_SITE_OTHER): Payer: PPO | Admitting: *Deleted

## 2015-05-03 DIAGNOSIS — I4819 Other persistent atrial fibrillation: Secondary | ICD-10-CM

## 2015-05-03 DIAGNOSIS — Z5181 Encounter for therapeutic drug level monitoring: Secondary | ICD-10-CM | POA: Diagnosis not present

## 2015-05-03 DIAGNOSIS — I481 Persistent atrial fibrillation: Secondary | ICD-10-CM

## 2015-05-03 LAB — POCT INR: INR: 2.4

## 2015-05-03 NOTE — Progress Notes (Signed)
Pre visit review using our clinic review tool, if applicable. No additional management support is needed unless otherwise documented below in the visit note. 

## 2015-05-10 ENCOUNTER — Encounter (INDEPENDENT_AMBULATORY_CARE_PROVIDER_SITE_OTHER): Payer: PPO | Admitting: *Deleted

## 2015-05-10 DIAGNOSIS — I481 Persistent atrial fibrillation: Secondary | ICD-10-CM | POA: Diagnosis not present

## 2015-05-10 DIAGNOSIS — I4819 Other persistent atrial fibrillation: Secondary | ICD-10-CM

## 2015-05-10 DIAGNOSIS — Z5181 Encounter for therapeutic drug level monitoring: Secondary | ICD-10-CM

## 2015-05-10 NOTE — Progress Notes (Signed)
Patient left without being seen.

## 2015-05-28 ENCOUNTER — Other Ambulatory Visit: Payer: Self-pay | Admitting: Family Medicine

## 2015-07-04 NOTE — Progress Notes (Signed)
HPI The patient presents for followup of atrial fibrillation and syncope.  She has been on anticoagulation.  However, after the last visit she called and said she refused to take her Eliquis.  She said she had increased falls on Coumadin and she refuses to take this. The risks were discussed with her. She adamantly refuses anticoagulation.  Since I last saw her she has had possibly one fall or syncopal episode.  It is difficult to get a clear handle on what's happening during these events but she's had an extensive workup. She does feel her heart racing at times at night. However, she can't tell that she is fibrillation right now. She has not taken any nitroglycerin.She's had no new shortness of breath, PND or orthopnea.   Allergies  Allergen Reactions  . Coumadin [Warfarin Sodium] Other (See Comments)    Makes pass out  . Adhesive [Tape] Rash  . Ivp Dye [Iodinated Diagnostic Agents] Other (See Comments)    Rash and turns purple Rash and turns purple  . Vytorin [Ezetimibe-Simvastatin] Other (See Comments)    leg cramps    Current Outpatient Prescriptions  Medication Sig Dispense Refill  . allopurinol (ZYLOPRIM) 100 MG tablet TAKE ONE TABLET EVERY DAY 30 tablet 11  . aspirin EC 81 MG tablet Take 1 tablet (81 mg total) by mouth daily.    Marland Kitchen atorvastatin (LIPITOR) 40 MG tablet Take 40 mg by mouth every evening.    Marland Kitchen DIGOX 125 MCG tablet TAKE ONE TABLET EVERY DAY 30 tablet 6  . FLUoxetine (PROZAC) 20 MG capsule TAKE 1 CAPSULE BY MOUTH ONCE DAILY 30 capsule 6  . furosemide (LASIX) 20 MG tablet Take 0.5 tablets (10 mg total) by mouth daily. 30 tablet 6  . lisinopril (PRINIVIL,ZESTRIL) 5 MG tablet Take 1 tablet (5 mg total) by mouth daily. 30 tablet 6  . metoprolol tartrate (LOPRESSOR) 25 MG tablet TAKE ONE TABLET BY MOUTH TWICE DAILY 60 tablet 11  . Multiple Vitamins-Minerals (ONE-A-DAY WOMENS 50+ ADVANTAGE) TABS Take by mouth daily.    Marland Kitchen NITROSTAT 0.4 MG SL tablet PLACE 1 TABLET UNDER TONGUE  EVERY 5 MIN AS NEEDED FOR CHEST PAIN IF NO RELIEF IN15 MIN CALL 911 (MAX 3 TABS) 25 tablet 5  . permethrin (ACTICIN) 5 % cream Apply 1 application topically once. 60 g 0   No current facility-administered medications for this visit.    Past Medical History  Diagnosis Date  . Dyslipidemia   . Depression with anxiety     celexa started 04/2010  . Hyperlipidemia   . Hypertension   . CAD (coronary artery disease)     95% LAD stenosis, Cypher x 02 September 1996  . Atrial fibrillation (HCC)   . Prediabetes 2001  . Cervicalgia   . LBP (low back pain) 2005    spinal stenosis and bulging discs at L2-L5 s/p lumbar laminectomy Dutch Quint)  . Osteopenia 07/2013    T score -2.2 L femur  . Gout     prior PCP stopped allopurinol  . CHF (congestive heart failure) (HCC) 2014    on recent hospitalization  . Arthritis   . Syncopal episodes 2015    orthostatic, normal EEG and neuro eval ([facility])  . T12 compression fracture (HCC) 2015    by CT  . Atherosclerosis of abdominal aorta (HCC)   . Peripheral neuropathy (HCC) since feb 2015    right arm   . Lymphocytic colitis 10/28/2013  . Facial trauma 06/01/2014    Fall 05/2014 without  fracture by facial/head CT     Past Surgical History  Procedure Laterality Date  . Lumbar laminectomy  2005    Dr. Dutch QuintPoole  . Tonsillectomy    . Cardiac catheterization    . Percutaneous coronary stent intervention (pci-s)      stent x3 in heart  . Cataract extraction Bilateral   . Dexa  07/2013    T score -2.2 L femur  . Colonoscopy with propofol N/A 10/16/2013    Rachael Feeaniel P Jacobs, MD    ROS:  Insomnia, diarrhea.  Otherwise as stated in the HPI and negative for all other systems.  PHYSICAL EXAM BP 128/80 mmHg  Pulse 66  Ht 5\' 3"  (1.6 m)  Wt 119 lb (53.978 kg)  BMI 21.09 kg/m2 GENERAL:  Well appearing NECK:  No jugular venous distention, waveform within normal limits, carotid upstroke brisk and symmetric, no bruits, no thyromegaly LUNGS:  Clear to auscultation  bilaterally BACK:  No CVA tenderness but tender to palpation over the left chest at the site of her rash. HEART:  PMI not displaced or sustained,S1 and S2 within normal limits, no S3, no clicks, no rubs, no murmurs, irregular ABD:  Flat, positive bowel sounds normal in frequency in pitch, no bruits, no rebound, no guarding, no midline pulsatile mass, no hepatomegaly, no splenomegaly EXT:  2 plus pulses throughout, trace edema, no cyanosis no clubbing NEURO:  Non focal  EKG:  Atrial fibrillation, rate 66, axis within normal limits, intervals within normal limits, lateral ST depression nonspecific and  unchanged from previous.  ASSESSMENT AND PLAN  DIASTOLIC HF:   She seems to be euvolemic.  She was taken off of diuretic because of her syncope.  No change in therapy is indicated.   CAD -  She had a negative stress perfusion study in 2013  No testing is indicated at this point.   ESSENTIAL HYPERTENSION, BENIGN -  The blood pressure is at target. No change in medications is indicated. We will continue with therapeutic lifestyle changes (TLC).  ATRIAL FIBRILLATION -  [name] has a CHA2DS2 - VASc score of 5 with a risk of stroke of 6.7%  . She refuses to take anticoagulation.   SYNCOPE - She has either syncope or falls but has had an extensive work up without clear etiology.  No change in therapy is indicated.  She should not drive.

## 2015-07-05 ENCOUNTER — Ambulatory Visit (INDEPENDENT_AMBULATORY_CARE_PROVIDER_SITE_OTHER): Payer: PPO | Admitting: Cardiology

## 2015-07-05 ENCOUNTER — Encounter: Payer: Self-pay | Admitting: Cardiology

## 2015-07-05 ENCOUNTER — Encounter: Payer: Self-pay | Admitting: *Deleted

## 2015-07-05 ENCOUNTER — Telehealth: Payer: Self-pay | Admitting: Family Medicine

## 2015-07-05 VITALS — BP 128/80 | HR 66 | Ht 63.0 in | Wt 119.0 lb

## 2015-07-05 DIAGNOSIS — I4819 Other persistent atrial fibrillation: Secondary | ICD-10-CM

## 2015-07-05 DIAGNOSIS — I481 Persistent atrial fibrillation: Secondary | ICD-10-CM

## 2015-07-05 NOTE — Patient Instructions (Signed)
Your physician wants you to follow-up in: 6 Months You will receive a reminder letter in the mail two months in advance. If you don't receive a letter, please call our office to schedule the follow-up appointment.  

## 2015-07-05 NOTE — Telephone Encounter (Signed)
Pt needs a letter stating that [name] needs a letter stating that he is the power of attorney for her.  cb number home number

## 2015-07-05 NOTE — Telephone Encounter (Signed)
Message left advising patient that this type of letter has to come from an attorney since this is a legal representation. We have no copy of a POA in her chart, so there is nothing that we can verify anyway.

## 2015-07-06 ENCOUNTER — Encounter: Payer: Self-pay | Admitting: Cardiology

## 2015-07-12 ENCOUNTER — Ambulatory Visit: Payer: PPO | Admitting: Family Medicine

## 2015-07-12 DIAGNOSIS — Z0289 Encounter for other administrative examinations: Secondary | ICD-10-CM

## 2015-08-02 DIAGNOSIS — J849 Interstitial pulmonary disease, unspecified: Secondary | ICD-10-CM

## 2015-08-02 HISTORY — DX: Interstitial pulmonary disease, unspecified: J84.9

## 2015-08-03 ENCOUNTER — Ambulatory Visit (INDEPENDENT_AMBULATORY_CARE_PROVIDER_SITE_OTHER)
Admission: RE | Admit: 2015-08-03 | Discharge: 2015-08-03 | Disposition: A | Discharge status: Home / Self Care | Payer: PPO | Source: Ambulatory Visit | Attending: Family Medicine | Admitting: Family Medicine

## 2015-08-03 ENCOUNTER — Ambulatory Visit (INDEPENDENT_AMBULATORY_CARE_PROVIDER_SITE_OTHER): Payer: PPO | Admitting: Family Medicine

## 2015-08-03 ENCOUNTER — Encounter: Payer: Self-pay | Admitting: Family Medicine

## 2015-08-03 VITALS — BP 130/82 | HR 88 | Temp 98.0°F | Wt 121.8 lb

## 2015-08-03 DIAGNOSIS — I4819 Other persistent atrial fibrillation: Secondary | ICD-10-CM

## 2015-08-03 DIAGNOSIS — M545 Low back pain, unspecified: Secondary | ICD-10-CM

## 2015-08-03 DIAGNOSIS — R634 Abnormal weight loss: Secondary | ICD-10-CM | POA: Diagnosis not present

## 2015-08-03 DIAGNOSIS — I481 Persistent atrial fibrillation: Secondary | ICD-10-CM | POA: Diagnosis not present

## 2015-08-03 DIAGNOSIS — K52832 Lymphocytic colitis: Secondary | ICD-10-CM | POA: Diagnosis not present

## 2015-08-03 NOTE — Progress Notes (Signed)
Pre visit review using our clinic review tool, if applicable. No additional management support is needed unless otherwise documented below in the visit note. 

## 2015-08-03 NOTE — Assessment & Plan Note (Signed)
Concern for recurrent colitis exacerbation over last month. Last saw Dr Christella HartiganJacobs 01/2014, continued prednisone and started on asacol - never started this, never f/u with GI. Will refer back.

## 2015-08-03 NOTE — Progress Notes (Signed)
BP 130/82 mmHg  Pulse 88  Temp(Src) 98 F (36.7 C) (Oral)  Wt 121 lb 12 oz (55.225 kg)   CC: 4 mo f/u visit  Subjective:    Patient ID: [name], female    DOB: 10/27/1932, 82 y.o.   MRN: [id number]  HPI: [name] is a 82 y.o. female presenting on 08/03/2015 for Follow-up   Lives with son Olena LeatherwoodShannon Staver.   Atrial fibrillation - has seen Dr Antoine PocheHochrein. Refuses all anticoagulation. CHADSVASC2 score of 5. She endorses blood thinners made her fall. States she does not drive.   Colitis has returned over last month - up to 8 times per day. Stool urgency. Denies fevers/chills, chest pain, coughing. Mild dyspnea. No abdominal pain, nausea/vomiting. Has lost weight. Poor appetite. 1.5 mo h/o night sweats.   She had colonoscopy with biopsy showing lymphocytic colitis Christella Hartigan(Jacobs) 10/2013, started on prednisone. Seen in f/u 01/2014 by Dr Christella HartiganJacobs and rec start asacol, f/u in 1 month - never followed up or started asacol.   Back is hurting her over last 3 days - she has new pit bull dog and it is pulling her. Denies numbness or weakness of legs. Ongoing leg cramping. No bladder accidents. + bowel accidents.   Relevant past medical, surgical, family and social history reviewed and updated as indicated. Interim medical history since our last visit reviewed. Allergies and medications reviewed and updated. Current Outpatient Prescriptions on File Prior to Visit  Medication Sig  . allopurinol (ZYLOPRIM) 100 MG tablet TAKE ONE TABLET EVERY DAY  . aspirin EC 81 MG tablet Take 1 tablet (81 mg total) by mouth daily.  Marland Kitchen. atorvastatin (LIPITOR) 40 MG tablet Take 40 mg by mouth every evening.  Marland Kitchen. DIGOX 125 MCG tablet TAKE ONE TABLET EVERY DAY  . FLUoxetine (PROZAC) 20 MG capsule TAKE 1 CAPSULE BY MOUTH ONCE DAILY  . furosemide (LASIX) 20 MG tablet Take 0.5 tablets (10 mg total) by mouth daily.  Marland Kitchen. lisinopril (PRINIVIL,ZESTRIL) 5 MG tablet Take 1 tablet (5 mg total) by mouth daily.  . metoprolol  tartrate (LOPRESSOR) 25 MG tablet TAKE ONE TABLET BY MOUTH TWICE DAILY  . Multiple Vitamins-Minerals (ONE-A-DAY WOMENS 50+ ADVANTAGE) TABS Take by mouth daily.  Marland Kitchen. NITROSTAT 0.4 MG SL tablet PLACE 1 TABLET UNDER TONGUE EVERY 5 MIN AS NEEDED FOR CHEST PAIN IF NO RELIEF IN15 MIN CALL 911 (MAX 3 TABS)   No current facility-administered medications on file prior to visit.    Review of Systems Per HPI unless specifically indicated in ROS section     Objective:    BP 130/82 mmHg  Pulse 88  Temp(Src) 98 F (36.7 C) (Oral)  Wt 121 lb 12 oz (55.225 kg)  Wt Readings from Last 3 Encounters:  08/03/15 121 lb 12 oz (55.225 kg)  07/05/15 119 lb (53.978 kg)  04/13/15 126 lb (57.153 kg)    Physical Exam  Constitutional: She appears well-developed and well-nourished. No distress.  HENT:  Mouth/Throat: Oropharynx is clear and moist. No oropharyngeal exudate.  Eyes: Conjunctivae and EOM are normal. Pupils are equal, round, and reactive to light.  Neck: Normal range of motion. Neck supple.  Cardiovascular: Normal rate, normal heart sounds and intact distal pulses.  An irregularly irregular rhythm present.  No murmur heard. Pulmonary/Chest: Effort normal and breath sounds normal. No respiratory distress. She has no wheezes. She has no rales.  Abdominal: Soft. Normal appearance and bowel sounds are normal. She exhibits no distension and no mass. There is no  hepatosplenomegaly. There is tenderness (mild) in the right upper quadrant. There is no rigidity, no rebound, no guarding, no CVA tenderness and negative Murphy's sign.  Musculoskeletal: She exhibits no edema.  Midline lumbar spine tenderness to palpation  No pain with int/ ext rotation at hips bilaterally  Lymphadenopathy:    She has no cervical adenopathy.  Skin: Skin is warm and dry.  Psychiatric:  Talkative - difficult to get word in  Nursing note and vitals reviewed.     Assessment & Plan:   Problem List Items Addressed This Visit     Atrial fibrillation (HCC)    Persistent afib. Reviewed CVA risk off anticoagulant.      LBP (low back pain)    Check lumbar films.       Relevant Orders   DG Lumbar Spine 2-3 Views   Loss of weight    Check labs, check CXR.      Relevant Orders   Comprehensive metabolic panel   CBC with Differential/Platelet   Sedimentation rate   DG Chest 2 View   Lymphocytic colitis - Primary    Concern for recurrent colitis exacerbation over last month. Last saw Dr Christella Hartigan 01/2014, continued prednisone and started on asacol - never started this, never f/u with GI. Will refer back.       Relevant Orders   Ambulatory referral to Gastroenterology       Follow up plan: Return in about 2 months (around 10/03/2015), or as needed, for follow up visit.  Eustaquio Boyden, MD

## 2015-08-03 NOTE — Assessment & Plan Note (Signed)
Check lumbar films.

## 2015-08-03 NOTE — Assessment & Plan Note (Signed)
Persistent afib. Reviewed CVA risk off anticoagulant.

## 2015-08-03 NOTE — Patient Instructions (Addendum)
Call [facility] to schedule mammogram Labs today Lumbar xray and chest xray today.  Return to see me in 2 months for follow up.

## 2015-08-03 NOTE — Assessment & Plan Note (Signed)
Check labs, check CXR.

## 2015-08-04 LAB — CBC WITH DIFFERENTIAL/PLATELET
BASOS PCT: 1.5 % (ref 0.0–3.0)
Basophils Absolute: 0.1 10*3/uL (ref 0.0–0.1)
EOS PCT: 8.3 % — AB (ref 0.0–5.0)
Eosinophils Absolute: 0.7 10*3/uL (ref 0.0–0.7)
HCT: 38 % (ref 36.0–46.0)
HEMOGLOBIN: 12.7 g/dL (ref 12.0–15.0)
Lymphocytes Relative: 19.4 % (ref 12.0–46.0)
Lymphs Abs: 1.7 10*3/uL (ref 0.7–4.0)
MCHC: 33.4 g/dL (ref 30.0–36.0)
MCV: 101.1 fl — ABNORMAL HIGH (ref 78.0–100.0)
MONO ABS: 0.7 10*3/uL (ref 0.1–1.0)
Monocytes Relative: 7.6 % (ref 3.0–12.0)
Neutro Abs: 5.5 10*3/uL (ref 1.4–7.7)
Neutrophils Relative %: 63.2 % (ref 43.0–77.0)
Platelets: 236 10*3/uL (ref 150.0–400.0)
RBC: 3.75 Mil/uL — ABNORMAL LOW (ref 3.87–5.11)
RDW: 14 % (ref 11.5–15.5)
WBC: 8.7 10*3/uL (ref 4.0–10.5)

## 2015-08-04 LAB — COMPREHENSIVE METABOLIC PANEL
ALBUMIN: 4.4 g/dL (ref 3.5–5.2)
ALT: 14 U/L (ref 0–35)
AST: 24 U/L (ref 0–37)
Alkaline Phosphatase: 127 U/L — ABNORMAL HIGH (ref 39–117)
BUN: 12 mg/dL (ref 6–23)
CHLORIDE: 101 meq/L (ref 96–112)
CO2: 29 mEq/L (ref 19–32)
Calcium: 9.7 mg/dL (ref 8.4–10.5)
Creatinine, Ser: 0.75 mg/dL (ref 0.40–1.20)
GFR: 78.79 mL/min (ref >60.00)
GLUCOSE: 101 mg/dL — AB (ref 70–99)
POTASSIUM: 3.6 meq/L (ref 3.5–5.1)
SODIUM: 141 meq/L (ref 135–145)
Total Bilirubin: 0.9 mg/dL (ref 0.2–1.2)
Total Protein: 7.1 g/dL (ref 6.0–8.3)

## 2015-08-04 LAB — SEDIMENTATION RATE: SED RATE: 9 mm/h (ref 0–22)

## 2015-08-06 ENCOUNTER — Encounter: Payer: Self-pay | Admitting: *Deleted

## 2015-08-06 ENCOUNTER — Encounter: Payer: Self-pay | Admitting: Family Medicine

## 2015-08-11 ENCOUNTER — Other Ambulatory Visit: Payer: Self-pay | Admitting: Family Medicine

## 2015-08-11 DIAGNOSIS — Z1231 Encounter for screening mammogram for malignant neoplasm of breast: Secondary | ICD-10-CM

## 2015-08-17 ENCOUNTER — Other Ambulatory Visit: Payer: Self-pay | Admitting: Family Medicine

## 2015-08-17 ENCOUNTER — Ambulatory Visit
Admission: RE | Admit: 2015-08-17 | Discharge: 2015-08-17 | Disposition: A | Discharge status: Home / Self Care | Payer: PPO | Source: Ambulatory Visit | Attending: Family Medicine | Admitting: Family Medicine

## 2015-08-17 DIAGNOSIS — Z1231 Encounter for screening mammogram for malignant neoplasm of breast: Secondary | ICD-10-CM | POA: Insufficient documentation

## 2015-08-17 LAB — HM MAMMOGRAPHY

## 2015-08-18 ENCOUNTER — Encounter: Payer: Self-pay | Admitting: *Deleted

## 2015-09-06 ENCOUNTER — Encounter: Payer: Self-pay | Admitting: Gastroenterology

## 2015-09-06 ENCOUNTER — Ambulatory Visit (INDEPENDENT_AMBULATORY_CARE_PROVIDER_SITE_OTHER): Payer: PPO | Admitting: Gastroenterology

## 2015-09-06 ENCOUNTER — Telehealth: Payer: Self-pay | Admitting: Family Medicine

## 2015-09-06 VITALS — BP 156/70 | HR 80 | Ht 62.5 in | Wt 122.2 lb

## 2015-09-06 DIAGNOSIS — R197 Diarrhea, unspecified: Secondary | ICD-10-CM

## 2015-09-06 NOTE — Telephone Encounter (Signed)
fyi

## 2015-09-06 NOTE — Telephone Encounter (Signed)
Pt wants you to know she went to the GI Dr. and you will be getting results this week. Please add to chart.

## 2015-09-06 NOTE — Patient Instructions (Addendum)
Start imodium; 1 pill twice daily. You will have labs checked today in the basement lab.  Please head down after you check out with the front desk  (stool for C. Diff by PCR and by toxin, routine stool cultures). Keep up on your fluids. Depending on stool testing results, you may be restarted on prednisone. Please return to see Dr. Christella HartiganJacobs in 6-8 weeks.

## 2015-09-06 NOTE — Progress Notes (Signed)
Review of pertinent gastrointestinal problems: 1. Lymphocytic colitis: Colonoscopy Dr. Christella HartiganJacobs 10/2014 showed normal TI, petechii in cecum, otherwise essentially normal examination, random biopsies + lymphocytic coltiis.Has lost 20-30 pounds in past year or so. Diarrhea started this past February 2015. Watery diarrhea, never bloody. Can go as many as 10-20 times per day. CT recent (without IV contrast) was normal. The CT scan was May 2015 Colonoscopy about 10 years ago; [facility] and she tells me it was normal. Labs may 2015 show essentially normal CBC except for eosinophil percent is elevated at 12%. Stool for Clostridium difficile by toxin and GDH were both normal. Fecal active. Negative. Stool testing June 2015 shows positive for Hemoccult. She was started on budesonide 9mg  daily 10/2013, could not afford it, instead pred 5mg  BID.  October 2015 office visit; improved on prednisone but still not well. She was recommended to stay on prednisone 10 mg a day and start mesalamine full strength. She was set for follow-up 4-6 weeks later but that never happened.   HPI: This is a   very pleasant 82 year old woman whom I last saw almost 2 years ago  Chief complaint is watery diarrhea   I last saw her  almost 2 years ago. At that point she was taking prednisone 10 mg a day and I added mesalamine full strength. She was recommended to come back in 4-6 weeks. That never happened we have not heard from her since.  She is not on prednisone or mesalamine at the time of this visit.  No change in weight since 2 years ago (same scale here in GI)  She tells me daily diarrhea.  Non bloody, watery.   Will go 7-8 times and also have nocturnal symptoms.  She does not really recall being on prednisone.  She has clear memory difficulties, does not remember being on prednisone the past. Does not recall the diagnosis of lymphocytic colitis.  Past Medical History  Diagnosis Date  . Dyslipidemia   . Depression with  anxiety     celexa started 04/2010  . Hyperlipidemia   . Hypertension   . CAD (coronary artery disease)     95% LAD stenosis, Cypher x 02 September 1996  . Atrial fibrillation (HCC)   . Prediabetes 2001  . Cervicalgia   . LBP (low back pain) 2005    spinal stenosis and bulging discs at L2-L5 s/p lumbar laminectomy Dutch Quint([facility])  . Osteopenia 07/2013    T score -2.2 L femur  . Gout     prior PCP stopped allopurinol  . CHF (congestive heart failure) (HCC) 2014    on recent hospitalization  . Arthritis   . Syncopal episodes 2015    orthostatic, normal EEG and neuro eval ([facility])  . T12 compression fracture (HCC) 2015    by CT  . Atherosclerosis of abdominal aorta (HCC)     by xray  . Peripheral neuropathy (HCC) since feb 2015    right arm   . Lymphocytic colitis 10/28/2013  . Facial trauma 06/01/2014    Fall 05/2014 without fracture by facial/head CT   . Chronic interstitial lung disease (HCC) 08/2015    by CXR    Past Surgical History  Procedure Laterality Date  . Lumbar laminectomy  2005    Dr. Dutch QuintPoole  . Tonsillectomy    . Cardiac catheterization    . Percutaneous coronary stent intervention (pci-s)      stent x3 in heart  . Cataract extraction Bilateral   . Dexa  07/2013  T score -2.2 L femur  . Colonoscopy with propofol N/A 10/16/2013    Rachael Fee, MD    Current Outpatient Prescriptions  Medication Sig Dispense Refill  . allopurinol (ZYLOPRIM) 100 MG tablet TAKE ONE TABLET EVERY DAY 30 tablet 11  . aspirin EC 81 MG tablet Take 1 tablet (81 mg total) by mouth daily.    Marland Kitchen atorvastatin (LIPITOR) 40 MG tablet Take 40 mg by mouth every evening.    Marland Kitchen DIGOX 125 MCG tablet TAKE ONE TABLET EVERY DAY 30 tablet 6  . FLUoxetine (PROZAC) 20 MG capsule TAKE 1 CAPSULE BY MOUTH ONCE DAILY 30 capsule 6  . furosemide (LASIX) 20 MG tablet Take 0.5 tablets (10 mg total) by mouth daily. 30 tablet 6  . lisinopril (PRINIVIL,ZESTRIL) 5 MG tablet Take 1 tablet (5 mg total) by mouth daily. 30  tablet 6  . metoprolol tartrate (LOPRESSOR) 25 MG tablet TAKE ONE TABLET BY MOUTH TWICE DAILY 60 tablet 11  . Multiple Vitamins-Minerals (ONE-A-DAY WOMENS 50+ ADVANTAGE) TABS Take by mouth daily.    Marland Kitchen NITROSTAT 0.4 MG SL tablet PLACE 1 TABLET UNDER TONGUE EVERY 5 MIN AS NEEDED FOR CHEST PAIN IF NO RELIEF IN15 MIN CALL 911 (MAX 3 TABS) (Patient not taking: Reported on 09/06/2015) 25 tablet 5   No current facility-administered medications for this visit.    Allergies as of 09/06/2015 - Review Complete 09/06/2015  Allergen Reaction Noted  . Coumadin [warfarin sodium] Other (See Comments) 07/05/2015  . Adhesive [tape] Rash 09/27/2012  . Ivp dye [iodinated diagnostic agents] Other (See Comments) 09/15/2010  . Vytorin [ezetimibe-simvastatin] Other (See Comments)     Family History  Problem Relation Age of Onset  . Heart disease Brother   . Heart disease Other     First degree relatives with heart disease but later onset  . Prostate cancer Father   . Breast cancer Sister 62  . Heart disease Sister     pacer    Social History   Social History  . Marital Status: Widowed    Spouse Name: N/A  . Number of Children: 1  . Years of Education: N/A   Occupational History  .     Social History Main Topics  . Smoking status: Never Smoker   . Smokeless tobacco: Never Used  . Alcohol Use: Yes     Comment: Occasionally drinks beer  . Drug Use: No  . Sexual Activity: Not on file   Other Topics Concern  . 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 female with Flare of nonbloody diarrhea  She has had no abdominal pains. Labs last month show essentially normal CBC and complete metabolic profile. Sedimentation rate was normal. I'm most suspicious for persistent lymphocytic colitis. I think part of the difficulty in treating her is her clear memory difficulties. She does not recall ever being on prednisone in the past. She has missed several appointments. Currently she is not on any treatment for her lymphocytic colitis.  I recommended she start one over-the-counter Imodium twice daily and if stool testing shows no clear sign of infection I will restart her on prednisone 5 mg twice daily. We have scheduled a return follow-up visit with her for 6-8 weeks from now.   Rob Bunting, MD [facility] 09/06/2015, 9:22 AM

## 2015-09-07 ENCOUNTER — Other Ambulatory Visit: Payer: Self-pay | Admitting: Gastroenterology

## 2015-09-07 ENCOUNTER — Other Ambulatory Visit: Payer: PPO

## 2015-09-07 DIAGNOSIS — R197 Diarrhea, unspecified: Secondary | ICD-10-CM

## 2015-09-08 LAB — CLOSTRIDIUM DIFFICILE BY PCR: CDIFFPCR: NOT DETECTED

## 2015-09-11 LAB — STOOL CULTURE

## 2015-09-23 ENCOUNTER — Other Ambulatory Visit: Payer: Self-pay

## 2015-09-23 MED ORDER — PREDNISONE 5 MG PO TABS: 5.0000 mg | ORAL_TABLET | Freq: Two times a day (BID) | ORAL | Status: DC

## 2015-10-04 ENCOUNTER — Ambulatory Visit (INDEPENDENT_AMBULATORY_CARE_PROVIDER_SITE_OTHER): Payer: PPO | Admitting: Family Medicine

## 2015-10-04 ENCOUNTER — Encounter: Payer: Self-pay | Admitting: Family Medicine

## 2015-10-04 VITALS — BP 132/64 | HR 76 | Temp 97.9°F | Wt 124.0 lb

## 2015-10-04 DIAGNOSIS — K52832 Lymphocytic colitis: Secondary | ICD-10-CM

## 2015-10-04 DIAGNOSIS — R413 Other amnesia: Secondary | ICD-10-CM | POA: Diagnosis not present

## 2015-10-04 DIAGNOSIS — R21 Rash and other nonspecific skin eruption: Secondary | ICD-10-CM | POA: Diagnosis not present

## 2015-10-04 DIAGNOSIS — I481 Persistent atrial fibrillation: Secondary | ICD-10-CM | POA: Diagnosis not present

## 2015-10-04 DIAGNOSIS — I4819 Other persistent atrial fibrillation: Secondary | ICD-10-CM

## 2015-10-04 NOTE — Progress Notes (Signed)
BP 132/64 mmHg  Pulse 76  Temp(Src) 97.9 F (36.6 C) (Oral)  Wt 124 lb (56.246 kg)   CC: f/u visit  Subjective:    Patient ID: [name], female    DOB: May 04, 1932, 82 y.o.   MRN: [id number]  HPI: LILLIEN Thomas is a 82 y.o. female presenting on 10/04/2015 for Follow-up   Lives with son [name] Lost sister 2 wks ago - she lived in [place].   Here for 48mo f/u - seen by Dr Christella Hartigan. Labs did not show any obvious infectious cause of chronic diarrhea, presumed lymphocytic colitis - started on immodium and prednisone 5mg  twice daily. Feeling better. Stools are beginning to form, down to 4 times daily.   Finds prednisone causing some night sweats. No cough, fever, TB exposure. Ex nurse.   Atrial fibrillation - has seen Dr Antoine Poche. Refuses all anticoagulation. CHADSVASC2 score of 5. She endorses blood thinners made her fall. States she does not drive.   Endorses she's noticed some forgetfulness develop over last few months.   Relevant past medical, surgical, family and social history reviewed and updated as indicated. Interim medical history since our last visit reviewed. Allergies and medications reviewed and updated. Current Outpatient Prescriptions on File Prior to Visit  Medication Sig  . allopurinol (ZYLOPRIM) 100 MG tablet TAKE ONE TABLET EVERY DAY  . aspirin EC 81 MG tablet Take 1 tablet (81 mg total) by mouth daily.  Marland Kitchen atorvastatin (LIPITOR) 40 MG tablet Take 40 mg by mouth every evening.  Marland Kitchen DIGOX 125 MCG tablet TAKE ONE TABLET EVERY DAY  . FLUoxetine (PROZAC) 20 MG capsule TAKE 1 CAPSULE BY MOUTH ONCE DAILY  . furosemide (LASIX) 20 MG tablet Take 0.5 tablets (10 mg total) by mouth daily.  Marland Kitchen lisinopril (PRINIVIL,ZESTRIL) 5 MG tablet Take 1 tablet (5 mg total) by mouth daily.  . metoprolol tartrate (LOPRESSOR) 25 MG tablet TAKE ONE TABLET BY MOUTH TWICE DAILY  . Multiple Vitamins-Minerals (ONE-A-DAY WOMENS 50+ ADVANTAGE) TABS Take by mouth daily.  Marland Kitchen NITROSTAT  0.4 MG SL tablet PLACE 1 TABLET UNDER TONGUE EVERY 5 MIN AS NEEDED FOR CHEST PAIN IF NO RELIEF IN15 MIN CALL 911 (MAX 3 TABS)  . predniSONE (DELTASONE) 5 MG tablet Take 1 tablet (5 mg total) by mouth 2 (two) times daily with a meal. Decrease to once daily after 4 weeks   No current facility-administered medications on file prior to visit.    Review of Systems Per HPI unless specifically indicated in ROS section     Objective:    BP 132/64 mmHg  Pulse 76  Temp(Src) 97.9 F (36.6 C) (Oral)  Wt 124 lb (56.246 kg)  Wt Readings from Last 3 Encounters:  10/04/15 124 lb (56.246 kg)  09/06/15 122 lb 4 oz (55.452 kg)  08/03/15 121 lb 12 oz (55.225 kg)   Body mass index is 22.3 kg/(m^2).  Physical Exam  Constitutional: She appears well-developed and well-nourished. No distress.  HENT:  Mouth/Throat: Oropharynx is clear and moist. No oropharyngeal exudate.  Cardiovascular: Normal rate, regular rhythm, normal heart sounds and intact distal pulses.   No murmur heard. Pulmonary/Chest: Effort normal and breath sounds normal. No respiratory distress. She has no wheezes. She has no rales.  Musculoskeletal: She exhibits no edema.  Skin: Skin is warm and dry. No rash noted.  Psychiatric: She has a normal mood and affect.  In bright spirits today  Nursing note and vitals reviewed.  Results for orders placed or performed in  visit on 09/07/15  Clostridium Difficile by PCR  Result Value Ref Range   Toxigenic C Difficile by pcr Not Detected Not Detected      Assessment & Plan:   Problem List Items Addressed This Visit    Rash and nonspecific skin eruption    Improved after derm eval, on bleach baths      Atrial fibrillation (HCC)    Persistent afib. Refuses AC, continue aspirin.       Lymphocytic colitis - Primary    Appreciate GI care of patient. Marked improvement on prednisone, reminded of GI f/u 11/10/2015. Continue prednisone 5mg  BID x 4 wks then try decrease to 5mg  once daily.         Memory impairment    Pt endorses gradually noticed developing memory impairment RTC 3 mo geriatric assessment and MMSE          Follow up plan: Return in about 5 months (around 03/05/2016) for follow up visit.  Sherry Boyden, MD

## 2015-10-04 NOTE — Assessment & Plan Note (Signed)
Appreciate GI care of patient. Marked improvement on prednisone, reminded of GI f/u 11/10/2015. Continue prednisone 5mg  BID x 4 wks then try decrease to 5mg  once daily.

## 2015-10-04 NOTE — Assessment & Plan Note (Signed)
Pt endorses gradually noticed developing memory impairment RTC 3 mo geriatric assessment and MMSE

## 2015-10-04 NOTE — Assessment & Plan Note (Signed)
Improved after derm eval, on bleach baths

## 2015-10-04 NOTE — Patient Instructions (Addendum)
You are doing better today. Good to see you. Return in 3 months for flu shot and memory testing.

## 2015-10-04 NOTE — Progress Notes (Signed)
Pre visit review using our clinic review tool, if applicable. No additional management support is needed unless otherwise documented below in the visit note. 

## 2015-10-04 NOTE — Assessment & Plan Note (Signed)
Persistent afib. Refuses AC, continue aspirin.

## 2015-10-11 ENCOUNTER — Other Ambulatory Visit: Payer: Self-pay | Admitting: Family Medicine

## 2015-11-10 ENCOUNTER — Ambulatory Visit (INDEPENDENT_AMBULATORY_CARE_PROVIDER_SITE_OTHER): Payer: PPO | Admitting: Gastroenterology

## 2015-11-10 ENCOUNTER — Encounter: Payer: Self-pay | Admitting: Gastroenterology

## 2015-11-10 VITALS — BP 122/56 | HR 64 | Ht 62.5 in | Wt 129.0 lb

## 2015-11-10 DIAGNOSIS — K52832 Lymphocytic colitis: Secondary | ICD-10-CM | POA: Diagnosis not present

## 2015-11-10 NOTE — Patient Instructions (Signed)
Please continue on prednisone 5mg  once daily Also start taking two OTC imodium pills at bedtime every night. Please return to see Dr. Christella HartiganJacobs in 2 months.

## 2015-11-10 NOTE — Progress Notes (Signed)
Review of pertinent gastrointestinal problems: 1. Lymphocytic colitis: Colonoscopy Dr. Christella Hartigan 10/2014 showed normal TI, petechii in cecum, otherwise essentially normal examination, random biopsies + lymphocytic coltiis.Has lost 20-30 pounds in past year or so. Diarrhea started this past February 2015. Watery diarrhea, never bloody. Can go as many as 10-20 times per day. CT recent (without IV contrast) was normal. The CT scan was May 2015 Colonoscopy about 10 years ago; at [facility] and she tells me it was normal. Labs may 2015 show essentially normal CBC except for eosinophil percent is elevated at 12%. Stool for Clostridium difficile by toxin and GDH were both normal. Fecal active. Negative. Stool testing June 2015 shows positive for Hemoccult. She was started on budesonide  daily 10/2013, could not afford it, instead pred  BID.  October 2015 office visit; improved on prednisone but still not well. She was recommended to stay on prednisone 10 mg a day and start mesalamine full strength. She was set for follow-up 4-6 weeks later but that never happened.  09/2015 rov: she was taking no meds.  Repeat stool testing all negative for infection, started her on prednisone  bidd.    HPI: This is a very pleasant 82 year old woman whom I last saw a month and a half ago  Chief complaint is persistent diarrhea  She gets up all night long with diarrhea.  During day she is completely fine.  She is taking prednisone 5 mg once per day.  She is really back and forth with her history.     Past Medical History:  Diagnosis Date  . Arthritis   . Atherosclerosis of abdominal aorta (HCC)    by xray  . Atrial fibrillation (HCC)   . CAD (coronary artery disease)    95% LAD stenosis, Cypher x 02 September 1996  . Cervicalgia   . CHF (congestive heart failure) (HCC) 2014   on recent hospitalization  . Chronic interstitial lung disease (HCC) 08/2015   by CXR  . Depression with anxiety    celexa started 04/2010   . Dyslipidemia   . Facial trauma 06/01/2014   Fall 05/2014 without fracture by facial/head CT   . Gout    prior PCP stopped allopurinol  . Hyperlipidemia   . Hypertension   . LBP (low back pain) 2005   spinal stenosis and bulging discs at L2-L5 s/p lumbar laminectomy Dutch Quint)  . Lymphocytic colitis 10/28/2013  . Osteopenia 07/2013   T score -2.2 L femur  . Peripheral neuropathy (HCC) since feb 2015   right arm   . Prediabetes 2001  . Syncopal episodes 2015   orthostatic, normal EEG and neuro eval ([place])  . T12 compression fracture (HCC) 2015   by CT    Past Surgical History:  Procedure Laterality Date  . CARDIAC CATHETERIZATION    . CATARACT EXTRACTION Bilateral   . COLONOSCOPY WITH PROPOFOL N/A 10/16/2013   Rachael Fee, MD  . DEXA  07/2013   T score -2.2 L femur  . LUMBAR LAMINECTOMY  2005   Dr. Dutch Quint  . PERCUTANEOUS CORONARY STENT INTERVENTION (PCI-S)     stent x3 in heart  . TONSILLECTOMY    . TUBAL LIGATION      Current Outpatient Prescriptions  Medication Sig Dispense Refill  . allopurinol (ZYLOPRIM) 100 MG tablet TAKE ONE TABLET EVERY DAY 30 tablet 11  . aspirin EC 81 MG tablet Take 1 tablet (81 mg total) by mouth daily.    Marland Kitchen atorvastatin (LIPITOR) 40 MG tablet Take 40  mg by mouth every evening.    Marland Kitchen. atorvastatin (LIPITOR) 40 MG tablet TAKE ONE TABLET EVERY DAY 30 tablet 5  . DIGOX 125 MCG tablet TAKE ONE TABLET EVERY DAY 30 tablet 6  . FLUoxetine (PROZAC) 20 MG capsule TAKE 1 CAPSULE BY MOUTH ONCE DAILY 30 capsule 6  . furosemide (LASIX) 20 MG tablet Take 0.5 tablets (10 mg total) by mouth daily. 30 tablet 6  . lisinopril (PRINIVIL,ZESTRIL) 5 MG tablet Take 1 tablet (5 mg total) by mouth daily. 30 tablet 6  . loperamide (IMODIUM) 2 MG capsule Take 2 mg by mouth as needed for diarrhea or loose stools.    . metoprolol tartrate (LOPRESSOR) 25 MG tablet TAKE ONE TABLET BY MOUTH TWICE DAILY 60 tablet 11  . Multiple Vitamins-Minerals (ONE-A-DAY WOMENS 50+ ADVANTAGE)  TABS Take by mouth daily.    Marland Kitchen. NITROSTAT 0.4 MG SL tablet PLACE 1 TABLET UNDER TONGUE EVERY 5 MIN AS NEEDED FOR CHEST PAIN IF NO RELIEF IN15 MIN CALL 911 (MAX 3 TABS) 25 tablet 5  . predniSONE (DELTASONE) 5 MG tablet Take 1 tablet (5 mg total) by mouth 2 (two) times daily with a meal. Decrease to once daily after 4 weeks 60 tablet 3   No current facility-administered medications for this visit.     Allergies as of 11/10/2015 - Review Complete 11/10/2015  Allergen Reaction Noted  . Coumadin [warfarin sodium] Other (See Comments) 07/05/2015  . Adhesive [tape] Rash 09/27/2012  . Ivp dye [iodinated diagnostic agents] Other (See Comments) 09/15/2010  . Vytorin [ezetimibe-simvastatin] Other (See Comments)     Family History  Problem Relation Age of Onset  . Prostate cancer Father   . Breast cancer Sister [id number]  . Heart disease Brother   . Heart disease Other     First degree relatives with heart disease but later onset  . Heart disease Sister     pacer  . Esophageal cancer Neg Hx   . Colon cancer Neg Hx     Social History   Social History  . Marital status: Widowed    Spouse name: N/A  . Number of children: 1  . Years of education: N/A   Occupational History  .  Retired   Social History Main Topics  . Smoking status: Never Smoker  . Smokeless tobacco: Never Used  . Alcohol use Yes     Comment: Occasionally drinks beer  . Drug use: No  . Sexual activity: Not on file   Other Topics Concern  . 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 female with Lymphocytic colitis, persistent intermittent diarrhea  She is really back and forth with her history. She says she only has trouble with diarrhea at night. She never has trouble with diarrhea during the day. She things we might be playing a role and looking back in her records to see that I never really have tested her for celiac sprue. I offered her blood testing however  she does not want that today. She is going to send to try to avoid week and see if that makes a difference. Sounds pretty reasonable to me. Going to keep her on prednisone 5 mg once daily because I do think she has underlying lymphocytic colitis and I think that is the root of most of her loose bowels. She will add to scheduled Imodium pills at bedtime every night. She will return to see me in 2 months and sooner if needed.   Rob Bunting, MD [facility] 11/10/2015, 3:30 PM

## 2015-11-25 ENCOUNTER — Encounter: Payer: Self-pay | Admitting: Cardiology

## 2015-12-07 NOTE — Progress Notes (Signed)
HPI The patient presents for followup of atrial fibrillation and syncope.  She has been on anticoagulation.  However, she refused to take her Eliquis.  She said she had increased falls on Coumadin and she refuses to take this. The risks have been discussed with her in detail in the past. She adamantly refuses anticoagulation.    Since I last saw her she's had no further syncope. She actually feels pretty good. She's being treated with prednisone for colitis. This is increased her appetite and she's gained a little weight. The patient denies any new symptoms such as chest discomfort, neck or arm discomfort. There has been no new shortness of breath, PND or orthopnea. There have been no reported palpitations, presyncope or syncope.  Allergies  Allergen Reactions  . Coumadin [Warfarin Sodium] Other (See Comments)    Makes pass out  . Adhesive [Tape] Rash  . Ivp Dye [Iodinated Diagnostic Agents] Other (See Comments)    Rash and turns purple Rash and turns purple  . Vytorin [Ezetimibe-Simvastatin] Other (See Comments)    leg cramps    Current Outpatient Prescriptions  Medication Sig Dispense Refill  . allopurinol (ZYLOPRIM) 100 MG tablet TAKE ONE TABLET EVERY DAY 30 tablet 11  . aspirin EC 81 MG tablet Take 1 tablet (81 mg total) by mouth daily.    Marland Kitchen. atorvastatin (LIPITOR) 40 MG tablet TAKE ONE TABLET EVERY DAY 30 tablet 5  . DIGOX 125 MCG tablet TAKE ONE TABLET EVERY DAY 30 tablet 6  . FLUoxetine (PROZAC) 20 MG capsule TAKE 1 CAPSULE BY MOUTH ONCE DAILY 30 capsule 6  . furosemide (LASIX) 20 MG tablet Take 0.5 tablets (10 mg total) by mouth daily. 30 tablet 6  . lisinopril (PRINIVIL,ZESTRIL) 5 MG tablet Take 1 tablet (5 mg total) by mouth daily. 30 tablet 6  . loperamide (IMODIUM) 2 MG capsule Take 2 mg by mouth as needed for diarrhea or loose stools.    . metoprolol tartrate (LOPRESSOR) 25 MG tablet TAKE ONE TABLET BY MOUTH TWICE DAILY 60 tablet 11  . Multiple Vitamins-Minerals (ONE-A-DAY  WOMENS 50+ ADVANTAGE) TABS Take by mouth daily.    Marland Kitchen. NITROSTAT 0.4 MG SL tablet PLACE 1 TABLET UNDER TONGUE EVERY 5 MIN AS NEEDED FOR CHEST PAIN IF NO RELIEF IN15 MIN CALL 911 (MAX 3 TABS) 25 tablet 5  . Oxycodone HCl 10 MG TABS Take 1 tablet by mouth as directed.    . predniSONE (DELTASONE) 5 MG tablet Take 1 tablet by mouth as directed.     No current facility-administered medications for this visit.     Past Medical History:  Diagnosis Date  . Arthritis   . Atherosclerosis of abdominal aorta (HCC)    by xray  . Atrial fibrillation (HCC)   . CAD (coronary artery disease)    95% LAD stenosis, Cypher x 02 September 1996  . Cervicalgia   . CHF (congestive heart failure) (HCC) 2014   on recent hospitalization  . Chronic interstitial lung disease (HCC) 08/2015   by CXR  . Depression with anxiety    celexa started 04/2010  . Dyslipidemia   . Facial trauma 06/01/2014   Fall 05/2014 without fracture by facial/head CT   . Gout    prior PCP stopped allopurinol  . Hyperlipidemia   . Hypertension   . LBP (low back pain) 2005   spinal stenosis and bulging discs at L2-L5 s/p lumbar laminectomy Dutch Quint([place])  . Lymphocytic colitis 10/28/2013  . Osteopenia 07/2013   T score -  2.2 L femur  . Peripheral neuropathy (HCC) since feb 2015   right arm   . Prediabetes 2001  . Syncopal episodes 2015   orthostatic, normal EEG and neuro eval ([facility])  . T12 compression fracture (HCC) 2015   by CT    Past Surgical History:  Procedure Laterality Date  . CARDIAC CATHETERIZATION    . CATARACT EXTRACTION Bilateral   . COLONOSCOPY WITH PROPOFOL N/A 10/16/2013   Rachael Fee, MD  . DEXA  07/2013   T score -2.2 L femur  . LUMBAR LAMINECTOMY  2005   Dr. Dutch Quint  . PERCUTANEOUS CORONARY STENT INTERVENTION (PCI-S)     stent x3 in heart  . TONSILLECTOMY    . TUBAL LIGATION      ROS:  Insomnia, diarrhea improved off of whole wheat.  Otherwise as stated in the HPI and negative for all other systems.  PHYSICAL  EXAM BP (!) 142/58   Pulse 71   Ht 5\' 5"  (1.651 m)   Wt 128 lb 6.4 oz (58.2 kg)   SpO2 97%   BMI 21.37 kg/m  GENERAL:  Well appearing NECK:  No jugular venous distention, waveform within normal limits, carotid upstroke brisk and symmetric, no bruits, no thyromegaly LUNGS:  Clear to auscultation bilaterally BACK:  No CVA tenderness but tender to palpation over the left chest at the site of her rash. HEART:  PMI not displaced or sustained,S1 and S2 within normal limits, no S3, no clicks, no rubs, no murmurs, irregular ABD:  Flat, positive bowel sounds normal in frequency in pitch, no bruits, no rebound, no guarding, no midline pulsatile mass, no hepatomegaly, no splenomegaly EXT:  2 plus pulses throughout, trace edema, no cyanosis no clubbing NEURO:  Non focal   ASSESSMENT AND PLAN  DIASTOLIC HF:   She seems to be euvolemic.  She was taken off of diuretic because of her syncope.  No change in therapy is indicated.   CAD -  She had a negative stress perfusion study in 2013  No testing is indicated at this point.   ESSENTIAL HYPERTENSION, BENIGN -  The blood pressure is at target. No change in medications is indicated. We will continue with therapeutic lifestyle changes (TLC).  ATRIAL FIBRILLATION -  Ms. SURENA WELGE has a CHA2DS2 - VASc score of 5 with a risk of stroke of 6.7%  . She refuses to take anticoagulation.   SYNCOPE - She has neither syncope or falls since I last saw her.

## 2015-12-09 ENCOUNTER — Ambulatory Visit (INDEPENDENT_AMBULATORY_CARE_PROVIDER_SITE_OTHER): Payer: PPO | Admitting: Cardiology

## 2015-12-09 ENCOUNTER — Encounter: Payer: Self-pay | Admitting: Cardiology

## 2015-12-09 VITALS — BP 142/58 | HR 71 | Ht 65.0 in | Wt 128.4 lb

## 2015-12-09 DIAGNOSIS — R55 Syncope and collapse: Secondary | ICD-10-CM

## 2015-12-09 DIAGNOSIS — I481 Persistent atrial fibrillation: Secondary | ICD-10-CM | POA: Diagnosis not present

## 2015-12-09 DIAGNOSIS — I4819 Other persistent atrial fibrillation: Secondary | ICD-10-CM

## 2015-12-09 NOTE — Patient Instructions (Signed)

## 2016-01-04 ENCOUNTER — Other Ambulatory Visit: Payer: Self-pay

## 2016-01-04 MED ORDER — FUROSEMIDE 20 MG PO TABS: 10.0000 mg | ORAL_TABLET | Freq: Every day | ORAL | 6 refills | Status: DC

## 2016-01-07 ENCOUNTER — Other Ambulatory Visit: Payer: Self-pay | Admitting: Family Medicine

## 2016-01-14 ENCOUNTER — Other Ambulatory Visit: Payer: Self-pay | Admitting: Family Medicine

## 2016-01-29 ENCOUNTER — Emergency Department: Payer: PPO

## 2016-01-29 ENCOUNTER — Encounter: Payer: Self-pay | Admitting: Emergency Medicine

## 2016-01-29 ENCOUNTER — Emergency Department
Admission: EM | Admit: 2016-01-29 | Discharge: 2016-01-29 | Disposition: A | Discharge status: Home / Self Care | Payer: PPO | Attending: Emergency Medicine | Admitting: Emergency Medicine

## 2016-01-29 DIAGNOSIS — I110 Hypertensive heart disease with heart failure: Secondary | ICD-10-CM | POA: Diagnosis not present

## 2016-01-29 DIAGNOSIS — Y999 Unspecified external cause status: Secondary | ICD-10-CM | POA: Insufficient documentation

## 2016-01-29 DIAGNOSIS — I5032 Chronic diastolic (congestive) heart failure: Secondary | ICD-10-CM | POA: Insufficient documentation

## 2016-01-29 DIAGNOSIS — Y929 Unspecified place or not applicable: Secondary | ICD-10-CM | POA: Insufficient documentation

## 2016-01-29 DIAGNOSIS — I2510 Atherosclerotic heart disease of native coronary artery without angina pectoris: Secondary | ICD-10-CM | POA: Insufficient documentation

## 2016-01-29 DIAGNOSIS — S63282A Dislocation of proximal interphalangeal joint of right middle finger, initial encounter: Secondary | ICD-10-CM | POA: Insufficient documentation

## 2016-01-29 DIAGNOSIS — S63239A Subluxation of proximal interphalangeal joint of unspecified finger, initial encounter: Secondary | ICD-10-CM

## 2016-01-29 DIAGNOSIS — W01198A Fall on same level from slipping, tripping and stumbling with subsequent striking against other object, initial encounter: Secondary | ICD-10-CM | POA: Insufficient documentation

## 2016-01-29 DIAGNOSIS — S6991XA Unspecified injury of right wrist, hand and finger(s), initial encounter: Secondary | ICD-10-CM | POA: Diagnosis present

## 2016-01-29 DIAGNOSIS — Z7982 Long term (current) use of aspirin: Secondary | ICD-10-CM | POA: Diagnosis not present

## 2016-01-29 DIAGNOSIS — S61212D Laceration without foreign body of right middle finger without damage to nail, subsequent encounter: Secondary | ICD-10-CM

## 2016-01-29 DIAGNOSIS — Y9389 Activity, other specified: Secondary | ICD-10-CM | POA: Diagnosis not present

## 2016-01-29 DIAGNOSIS — Z79899 Other long term (current) drug therapy: Secondary | ICD-10-CM | POA: Insufficient documentation

## 2016-01-29 MED ORDER — LIDOCAINE HCL (PF) 1 % IJ SOLN
INTRAMUSCULAR | Status: AC
  Filled 2016-01-29: qty 5

## 2016-01-29 MED ORDER — TRAMADOL HCL 50 MG PO TABS: 50.0000 mg | ORAL_TABLET | Freq: Two times a day (BID) | ORAL | 0 refills | Status: DC | PRN

## 2016-01-29 NOTE — ED Provider Notes (Signed)
[facility] Emergency Department Provider Note   ____________________________________________   First MD Initiated Contact with Patient 01/29/16 1222     (approximate)  I have reviewed the triage vital signs and the nursing notes.   HISTORY  Chief Complaint Fall and Laceration    HPI [name] is a 82 y.o. female presented with a laceration to the third digit right hand. Patient states she tripped and fell last night hit her fan. Patient  believe one of the blades lacerated her finger. Patient stated intermittent bleeding since incident. Patient denies loss sensation but state decreased range of motion with flexion of the finger. Incident occurred approximately 8 PM last night. Patient is rating the pain as a 10 over 10. Patient described a pain as "achy". Patient is not taking blood thinners.   Past Medical History:  Diagnosis Date  . Arthritis   . Atherosclerosis of abdominal aorta (HCC)    by xray  . Atrial fibrillation (HCC)   . CAD (coronary artery disease)    95% LAD stenosis, Cypher x 02 September 1996  . Cervicalgia   . CHF (congestive heart failure) (HCC) 2014   on recent hospitalization  . Chronic interstitial lung disease (HCC) 08/2015   by CXR  . Depression with anxiety    celexa started 04/2010  . Dyslipidemia   . Facial trauma 06/01/2014   Fall 05/2014 without fracture by facial/head CT   . Gout    prior PCP stopped allopurinol  . Hyperlipidemia   . Hypertension   . LBP (low back pain) 2005   spinal stenosis and bulging discs at L2-L5 s/p lumbar laminectomy Dutch Quint([place])  . Lymphocytic colitis 10/28/2013  . Osteopenia 07/2013   T score -2.2 L femur  . Peripheral neuropathy (HCC) since feb 2015   right arm   . Prediabetes 2001  . Syncopal episodes 2015   orthostatic, normal EEG and neuro eval ([place])  . T12 compression fracture (HCC) 2015   by CT    Patient Active Problem List   Diagnosis Date Noted  . Memory impairment  10/04/2015  . Chronic interstitial lung disease (HCC) 08/02/2015  . Advanced care planning/counseling discussion 04/14/2015  . Recurrent falls 04/14/2015  . Right shoulder pain 09/24/2014  . Candidal vulvovaginitis 05/26/2014  . Transient alteration of awareness 03/09/2014  . Chronic diastolic CHF (congestive heart failure) (HCC)   . Syncope 01/01/2014  . Syncope and collapse 01/01/2014  . Rib pain on left side 10/28/2013  . Lymphocytic colitis 10/28/2013  . Loss of weight 08/18/2013  . Medicare annual wellness visit, initial 06/11/2013  . Depression with anxiety   . Hypothyroidism 06/04/2013  . Atrial fibrillation (HCC)   . Peripheral neuropathy (HCC)   . Osteopenia   . LBP (low back pain)   . Gout   . Cramping of feet 07/19/2010  . Palpitations 07/19/2010  . EDEMA 01/19/2009  . HLD (hyperlipidemia) 07/02/2008  . Essential hypertension, benign 07/02/2008  . Coronary atherosclerosis 07/02/2008  . Rash and nonspecific skin eruption 07/02/2008    Past Surgical History:  Procedure Laterality Date  . CARDIAC CATHETERIZATION    . CATARACT EXTRACTION Bilateral   . COLONOSCOPY WITH PROPOFOL N/A 10/16/2013   Rachael Feeaniel P Jacobs, MD  . DEXA  07/2013   T score -2.2 L femur  . LUMBAR LAMINECTOMY  2005   Dr. Dutch QuintPoole  . PERCUTANEOUS CORONARY STENT INTERVENTION (PCI-S)     stent x3 in heart  . TONSILLECTOMY    .  TUBAL LIGATION      Prior to Admission medications   Medication Sig Start Date End Date Taking? Authorizing Provider  allopurinol (ZYLOPRIM) 100 MG tablet TAKE ONE TABLET EVERY DAY 01/07/16   Eustaquio BoydenJavier Gutierrez, MD  aspirin EC 81 MG tablet Take 1 tablet (81 mg total) by mouth daily. 04/14/15   Eustaquio BoydenJavier Gutierrez, MD  atorvastatin (LIPITOR) 40 MG tablet TAKE ONE TABLET EVERY DAY 10/12/15   Eustaquio BoydenJavier Gutierrez, MD  DIGOX 125 MCG tablet TAKE ONE TABLET EVERY DAY 01/28/15   Eustaquio BoydenJavier Gutierrez, MD  FLUoxetine (PROZAC) 20 MG capsule TAKE 1 CAPSULE BY MOUTH ONCE DAILY 03/02/15   Eustaquio BoydenJavier Gutierrez,  MD  furosemide (LASIX) 20 MG tablet Take 0.5 tablets (10 mg total) by mouth daily. 01/04/16   Rollene RotundaJames Hochrein, MD  lisinopril (PRINIVIL,ZESTRIL) 5 MG tablet TAKE 1 TABLET BY MOUTH DAILY 01/14/16   Eustaquio BoydenJavier Gutierrez, MD  loperamide (IMODIUM) 2 MG capsule Take 2 mg by mouth as needed for diarrhea or loose stools.    Historical Provider, MD  metoprolol tartrate (LOPRESSOR) 25 MG tablet TAKE ONE TABLET BY MOUTH TWICE DAILY 01/21/15   Eustaquio BoydenJavier Gutierrez, MD  Multiple Vitamins-Minerals (ONE-A-DAY WOMENS 50+ ADVANTAGE) TABS Take by mouth daily.    Historical Provider, MD  NITROSTAT 0.4 MG SL tablet PLACE 1 TABLET UNDER TONGUE EVERY 5 MIN AS NEEDED FOR CHEST PAIN IF NO RELIEF IN15 MIN CALL 911 (MAX 3 TABS) 01/16/14   Rollene RotundaJames Hochrein, MD  Oxycodone HCl 10 MG TABS Take 1 tablet by mouth as directed. 12/07/15   Historical Provider, MD  predniSONE (DELTASONE) 5 MG tablet Take 1 tablet by mouth as directed. 09/23/15   Historical Provider, MD  traMADol (ULTRAM) 50 MG tablet Take 1 tablet (50 mg total) by mouth every 12 (twelve) hours as needed for moderate pain. 01/29/16   Joni Reiningonald K Smith, PA-C    Allergies Coumadin [warfarin sodium]; Adhesive [tape]; Ivp dye [iodinated diagnostic agents]; and Vytorin [ezetimibe-simvastatin]  Family History  Problem Relation Age of Onset  . Prostate cancer Father   . Breast cancer Sister [id number]  . Heart disease Brother   . Heart disease Other     First degree relatives with heart disease but later onset  . Heart disease Sister     pacer  . Esophageal cancer Neg Hx   . Colon cancer Neg Hx     Social History Social History  Substance Use Topics  . Smoking status: Never Smoker  . Smokeless tobacco: Never Used  . Alcohol use Yes     Comment: Occasionally drinks beer    Review of Systems Constitutional: No fever/chills Eyes: No visual changes. ENT: No sore throat. Cardiovascular: Denies chest pain. Respiratory: Denies shortness of breath. Gastrointestinal: No abdominal  pain.  No nausea, no vomiting.  No diarrhea.  No constipation. Genitourinary: Negative for dysuria. Musculoskeletal: Right finger pain. Skin: Negative for rash. Laceration palmar aspect of the third digit right hand. Neurological: Negative for headaches, focal weakness or numbness. Psychiatric:Anxiety and depression Endocrine:Hyperlipidemia, hypertension, prediabetic Betty condition. Hematological/Lymphatic: Allergic/Immunilogical: **}   ____________________________________________   PHYSICAL EXAM:  VITAL SIGNS: ED Triage Vitals  Enc Vitals Group     BP 01/29/16 1153 (!) 192/66     Pulse Rate 01/29/16 1153 75     Resp 01/29/16 1153 17     Temp 01/29/16 1153 97.8 F (36.6 C)     Temp Source 01/29/16 1153 Oral     SpO2 01/29/16 1153 100 %     Weight 01/29/16 1155  118 lb (53.5 kg)     Height 01/29/16 1155 5\' 4"  (1.626 m)     Head Circumference --      Peak Flow --      Pain Score 01/29/16 1155 10     Pain Loc --      Pain Edu? --      Excl. in GC? --     Constitutional: Alert and oriented. Well appearing and in no acute distress. Eyes: Conjunctivae are normal. PERRL. EOMI. Head: Atraumatic. Nose: No congestion/rhinnorhea. Mouth/Throat: Mucous membranes are moist.  Oropharynx non-erythematous. Neck: No stridor.  No cervical spine tenderness to palpation. Hematological/Lymphatic/Immunilogical: No cervical lymphadenopathy. Cardiovascular: Normal rate, regular rhythm. Grossly normal heart sounds.  Good peripheral circulation.Elevated blood pressure Respiratory: Normal respiratory effort.  No retractions. Lungs CTAB. Gastrointestinal: Soft and nontender. No distention. No abdominal bruits. No CVA tenderness. Musculoskeletal: Obvious deformity to the third digit right hand.  Neurologic:  Normal speech and language. No gross focal neurologic deficits are appreciated. No gait instability. Skin:  Skin is warm, dry and intact. No rash noted. 1.57 laceration palmar aspect of the  third digit right hand. Psychiatric: Mood and affect are normal. Speech and behavior are normal.  ____________________________________________   LABS (all labs ordered are listed, but only abnormal results are displayed)  Labs Reviewed - No data to display ____________________________________________  EKG   ____________________________________________  RADIOLOGY  X-rays shoulder dislocation of the proximal phalange third digit right hand. ____________________________________________   PROCEDURES  Procedure(s) performed: LACERATION REPAIR Performed by: Joni Reining Authorized by: Joni Reining Consent: Verbal consent obtained. Risks and benefits: risks, benefits and alternatives were discussed Consent given by: patient Patient identity confirmed: provided demographic data Prepped and Draped in normal sterile fashion Wound explored  Laceration Location: Third digit right hand  Laceration Length: 1.5cm  No Foreign Bodies seen or palpated  Anesthesia: Digital block   Local anesthetic: lidocaine 1% without epinephrine  Anesthetic total: 5 ML ml  Irrigation method: syringe Amount of cleaning: standard  Skin closure: 4-0 Prolene   Number of sutures: 6 Technique: Interrupted Patient tolerance: Patient tolerated the procedure well with no immediate complications. Procedures Post digital block subluxation of the proximal phalange was reduced. Critical Care performed: No  ____________________________________________   INITIAL IMPRESSION / ASSESSMENT AND PLAN / ED COURSE  Pertinent labs & imaging results that were available during my care of the patient were reviewed by me and considered in my medical decision making (see chart for details).  Subluxation proximal phalange third digit right hand. Laceration third digit right hand. She given discharge care instructions. Patient advised return back in 10 days has suture removal or follow-up with family doctor had  sutures removed.   Clinical Course   Status post digital block subluxation was reduced. Postreduction films showed normal alignment. Status post sutures patient was placed in a finger splint. ____________________________________________   FINAL CLINICAL IMPRESSION(S) / ED DIAGNOSES  Final diagnoses:  Laceration of right middle finger without damage to nail, foreign body presence unspecified, subsequent encounter  Sublux of proximal interphaln joint of unsp finger, init      NEW MEDICATIONS STARTED DURING THIS VISIT:  New Prescriptions   TRAMADOL (ULTRAM) 50 MG TABLET    Take 1 tablet (50 mg total) by mouth every 12 (twelve) hours as needed for moderate pain.     Note:  This document was prepared using Dragon voice recognition software and may include unintentional dictation errors.    Joni Reining, PA-C  01/29/16 1432    Nita Sickle, MD 02/02/16 [phone]

## 2016-01-29 NOTE — ED Notes (Signed)
Pt states understanding of discharge instructions. NAD noted at this time.  

## 2016-01-29 NOTE — ED Notes (Signed)
Son of [name]  [name] cell [phone]  Home [phone] Please at discharge for ride

## 2016-01-29 NOTE — Discharge Instructions (Signed)
Wear finger splint for 2-3 days. May remove for personal hygiene.

## 2016-01-29 NOTE — ED Triage Notes (Signed)
Patients arrives to ED via POV. Patient ambulatory to triage, speaking full sentences. A&O x4. Patient tripped over her cord to her fan yesterday. States her finger got cut on the fan. Laceration noted to right middle finger. Bleeding controlled at this time. Positive pulses and sensation noted. Patient c/o 10/10 pain to affected digit. Patient denies LOC or hitting head. Patient denies being on blood thinners.

## 2016-02-04 ENCOUNTER — Encounter: Payer: Self-pay | Admitting: *Deleted

## 2016-02-04 ENCOUNTER — Emergency Department: Payer: PPO

## 2016-02-04 ENCOUNTER — Inpatient Hospital Stay
Admission: EM | Admit: 2016-02-04 | Discharge: 2016-02-08 | DRG: 920 | Disposition: A | Discharge status: Home Health | Payer: PPO | Attending: Internal Medicine | Admitting: Internal Medicine

## 2016-02-04 DIAGNOSIS — I10 Essential (primary) hypertension: Secondary | ICD-10-CM | POA: Diagnosis present

## 2016-02-04 DIAGNOSIS — L02511 Cutaneous abscess of right hand: Secondary | ICD-10-CM | POA: Diagnosis present

## 2016-02-04 DIAGNOSIS — Z888 Allergy status to other drugs, medicaments and biological substances status: Secondary | ICD-10-CM | POA: Diagnosis not present

## 2016-02-04 DIAGNOSIS — M8580 Other specified disorders of bone density and structure, unspecified site: Secondary | ICD-10-CM | POA: Diagnosis present

## 2016-02-04 DIAGNOSIS — R7303 Prediabetes: Secondary | ICD-10-CM | POA: Diagnosis present

## 2016-02-04 DIAGNOSIS — E876 Hypokalemia: Secondary | ICD-10-CM | POA: Diagnosis present

## 2016-02-04 DIAGNOSIS — Z8249 Family history of ischemic heart disease and other diseases of the circulatory system: Secondary | ICD-10-CM | POA: Diagnosis not present

## 2016-02-04 DIAGNOSIS — F329 Major depressive disorder, single episode, unspecified: Secondary | ICD-10-CM | POA: Diagnosis present

## 2016-02-04 DIAGNOSIS — Z23 Encounter for immunization: Secondary | ICD-10-CM

## 2016-02-04 DIAGNOSIS — T502X5A Adverse effect of carbonic-anhydrase inhibitors, benzothiadiazides and other diuretics, initial encounter: Secondary | ICD-10-CM | POA: Diagnosis present

## 2016-02-04 DIAGNOSIS — Z7982 Long term (current) use of aspirin: Secondary | ICD-10-CM

## 2016-02-04 DIAGNOSIS — Z955 Presence of coronary angioplasty implant and graft: Secondary | ICD-10-CM | POA: Diagnosis not present

## 2016-02-04 DIAGNOSIS — Z79899 Other long term (current) drug therapy: Secondary | ICD-10-CM

## 2016-02-04 DIAGNOSIS — L03011 Cellulitis of right finger: Secondary | ICD-10-CM | POA: Diagnosis present

## 2016-02-04 DIAGNOSIS — I2510 Atherosclerotic heart disease of native coronary artery without angina pectoris: Secondary | ICD-10-CM | POA: Diagnosis present

## 2016-02-04 DIAGNOSIS — M6281 Muscle weakness (generalized): Secondary | ICD-10-CM

## 2016-02-04 DIAGNOSIS — T8133XA Disruption of traumatic injury wound repair, initial encounter: Principal | ICD-10-CM | POA: Diagnosis present

## 2016-02-04 DIAGNOSIS — Z9181 History of falling: Secondary | ICD-10-CM | POA: Diagnosis not present

## 2016-02-04 DIAGNOSIS — L03113 Cellulitis of right upper limb: Secondary | ICD-10-CM

## 2016-02-04 DIAGNOSIS — Z91041 Radiographic dye allergy status: Secondary | ICD-10-CM | POA: Diagnosis not present

## 2016-02-04 DIAGNOSIS — E785 Hyperlipidemia, unspecified: Secondary | ICD-10-CM | POA: Diagnosis present

## 2016-02-04 DIAGNOSIS — F419 Anxiety disorder, unspecified: Secondary | ICD-10-CM | POA: Diagnosis present

## 2016-02-04 DIAGNOSIS — M7989 Other specified soft tissue disorders: Secondary | ICD-10-CM | POA: Diagnosis present

## 2016-02-04 DIAGNOSIS — M109 Gout, unspecified: Secondary | ICD-10-CM | POA: Diagnosis present

## 2016-02-04 DIAGNOSIS — G629 Polyneuropathy, unspecified: Secondary | ICD-10-CM | POA: Diagnosis present

## 2016-02-04 DIAGNOSIS — I482 Chronic atrial fibrillation: Secondary | ICD-10-CM | POA: Diagnosis present

## 2016-02-04 DIAGNOSIS — Y848 Other medical procedures as the cause of abnormal reaction of the patient, or of later complication, without mention of misadventure at the time of the procedure: Secondary | ICD-10-CM | POA: Diagnosis present

## 2016-02-04 DIAGNOSIS — T148XXA Other injury of unspecified body region, initial encounter: Secondary | ICD-10-CM

## 2016-02-04 DIAGNOSIS — J849 Interstitial pulmonary disease, unspecified: Secondary | ICD-10-CM | POA: Diagnosis present

## 2016-02-04 DIAGNOSIS — K52832 Lymphocytic colitis: Secondary | ICD-10-CM | POA: Diagnosis present

## 2016-02-04 DIAGNOSIS — Z9109 Other allergy status, other than to drugs and biological substances: Secondary | ICD-10-CM

## 2016-02-04 DIAGNOSIS — L089 Local infection of the skin and subcutaneous tissue, unspecified: Secondary | ICD-10-CM

## 2016-02-04 LAB — CBC WITH DIFFERENTIAL/PLATELET
BASOS ABS: 0.1 10*3/uL (ref 0–0.1)
BASOS PCT: 1 %
EOS PCT: 5 %
Eosinophils Absolute: 0.4 10*3/uL (ref 0–0.7)
HCT: 37.4 % (ref 35.0–47.0)
Hemoglobin: 12.8 g/dL (ref 12.0–16.0)
Lymphocytes Relative: 16 %
Lymphs Abs: 1.5 10*3/uL (ref 1.0–3.6)
MCH: 34.3 pg — ABNORMAL HIGH (ref 26.0–34.0)
MCHC: 34.2 g/dL (ref 32.0–36.0)
MCV: 100.1 fL — AB (ref 80.0–100.0)
MONO ABS: 1.1 10*3/uL — AB (ref 0.2–0.9)
MONOS PCT: 12 %
Neutro Abs: 6.4 10*3/uL (ref 1.4–6.5)
Neutrophils Relative %: 66 %
PLATELETS: 227 10*3/uL (ref 150–440)
RBC: 3.74 MIL/uL — ABNORMAL LOW (ref 3.80–5.20)
RDW: 12.5 % (ref 11.5–14.5)
WBC: 9.7 10*3/uL (ref 3.6–11.0)

## 2016-02-04 LAB — COMPREHENSIVE METABOLIC PANEL
ALT: 15 U/L (ref 14–54)
AST: 28 U/L (ref 15–41)
Albumin: 3.9 g/dL (ref 3.5–5.0)
Alkaline Phosphatase: 123 U/L (ref 38–126)
Anion gap: 13 (ref 5–15)
BUN: 8 mg/dL (ref 6–20)
CALCIUM: 9.1 mg/dL (ref 8.9–10.3)
CO2: 25 mmol/L (ref 22–32)
Chloride: 100 mmol/L — ABNORMAL LOW (ref 101–111)
Creatinine, Ser: 0.65 mg/dL (ref 0.44–1.00)
GFR calc Af Amer: >60 mL/min (ref >60)
GFR calc non Af Amer: >60 mL/min (ref >60)
GLUCOSE: 111 mg/dL — AB (ref 65–99)
Potassium: 2.8 mmol/L — ABNORMAL LOW (ref 3.5–5.1)
SODIUM: 138 mmol/L (ref 135–145)
TOTAL PROTEIN: 7.3 g/dL (ref 6.5–8.1)
Total Bilirubin: 0.8 mg/dL (ref 0.3–1.2)

## 2016-02-04 MED ORDER — MORPHINE SULFATE (PF) 2 MG/ML IV SOLN
2.0000 mg | Freq: Once | INTRAVENOUS | Status: AC
  Administered 2016-02-04: 2 mg via INTRAVENOUS
  Filled 2016-02-04: qty 1

## 2016-02-04 MED ORDER — SODIUM CHLORIDE 0.9 % IV SOLN
30.0000 meq | Freq: Once | INTRAVENOUS | Status: AC
  Administered 2016-02-05: 30 meq via INTRAVENOUS
  Filled 2016-02-04 (×2): qty 15

## 2016-02-04 MED ORDER — BISACODYL 5 MG PO TBEC
5.0000 mg | DELAYED_RELEASE_TABLET | Freq: Every day | ORAL | Status: DC | PRN
  Filled 2016-02-04: qty 1

## 2016-02-04 MED ORDER — DOCUSATE SODIUM 100 MG PO CAPS
100.0000 mg | ORAL_CAPSULE | Freq: Two times a day (BID) | ORAL | Status: DC
  Administered 2016-02-04: 23:00:00 100 mg via ORAL
  Filled 2016-02-04 (×3): qty 1

## 2016-02-04 MED ORDER — NITROGLYCERIN 0.4 MG SL SUBL: 0.4000 mg | SUBLINGUAL_TABLET | SUBLINGUAL | Status: DC | PRN

## 2016-02-04 MED ORDER — SODIUM CHLORIDE 0.9 % IV SOLN
INTRAVENOUS | Status: DC
  Administered 2016-02-04: 23:00:00 via INTRAVENOUS

## 2016-02-04 MED ORDER — LISINOPRIL 5 MG PO TABS
5.0000 mg | ORAL_TABLET | Freq: Every day | ORAL | Status: DC
  Administered 2016-02-04 – 2016-02-08 (×5): 5 mg via ORAL
  Filled 2016-02-04 (×5): qty 1

## 2016-02-04 MED ORDER — HEPARIN SODIUM (PORCINE) 5000 UNIT/ML IJ SOLN
5000.0000 [IU] | Freq: Three times a day (TID) | INTRAMUSCULAR | Status: DC
  Administered 2016-02-04 – 2016-02-07 (×7): 5000 [IU] via SUBCUTANEOUS
  Filled 2016-02-04 (×7): qty 1

## 2016-02-04 MED ORDER — TRAMADOL HCL 50 MG PO TABS
50.0000 mg | ORAL_TABLET | Freq: Two times a day (BID) | ORAL | Status: DC | PRN
  Administered 2016-02-05: 50 mg via ORAL
  Filled 2016-02-04 (×2): qty 1

## 2016-02-04 MED ORDER — TETANUS-DIPHTH-ACELL PERTUSSIS 5-2.5-18.5 LF-MCG/0.5 IM SUSP
0.5000 mL | Freq: Once | INTRAMUSCULAR | Status: AC
  Administered 2016-02-04: 0.5 mL via INTRAMUSCULAR
  Filled 2016-02-04: qty 0.5

## 2016-02-04 MED ORDER — VANCOMYCIN HCL IN DEXTROSE 1-5 GM/200ML-% IV SOLN
1000.0000 mg | Freq: Once | INTRAVENOUS | Status: AC
  Administered 2016-02-04: 1000 mg via INTRAVENOUS
  Filled 2016-02-04: qty 200

## 2016-02-04 MED ORDER — FLUOXETINE HCL 20 MG PO CAPS
20.0000 mg | ORAL_CAPSULE | Freq: Every day | ORAL | Status: DC
  Administered 2016-02-05 – 2016-02-08 (×4): 20 mg via ORAL
  Filled 2016-02-04 (×4): qty 1

## 2016-02-04 MED ORDER — ADULT MULTIVITAMIN W/MINERALS CH
1.0000 | ORAL_TABLET | Freq: Every day | ORAL | Status: DC
  Administered 2016-02-04 – 2016-02-05 (×2): 1 via ORAL
  Filled 2016-02-04 (×2): qty 1

## 2016-02-04 MED ORDER — TRAZODONE HCL 50 MG PO TABS: 25.0000 mg | ORAL_TABLET | Freq: Every evening | ORAL | Status: DC | PRN

## 2016-02-04 MED ORDER — ASPIRIN EC 81 MG PO TBEC
81.0000 mg | DELAYED_RELEASE_TABLET | Freq: Every day | ORAL | Status: DC
  Administered 2016-02-04 – 2016-02-05 (×2): 81 mg via ORAL
  Filled 2016-02-04 (×2): qty 1

## 2016-02-04 MED ORDER — ONDANSETRON HCL 4 MG PO TABS: 4.0000 mg | ORAL_TABLET | Freq: Four times a day (QID) | ORAL | Status: DC | PRN

## 2016-02-04 MED ORDER — POTASSIUM CHLORIDE CRYS ER 20 MEQ PO TBCR
40.0000 meq | EXTENDED_RELEASE_TABLET | Freq: Once | ORAL | Status: AC
  Administered 2016-02-04: 40 meq via ORAL
  Filled 2016-02-04: qty 2

## 2016-02-04 MED ORDER — VANCOMYCIN HCL IN DEXTROSE 750-5 MG/150ML-% IV SOLN
750.0000 mg | INTRAVENOUS | Status: DC
  Administered 2016-02-05 – 2016-02-08 (×4): 750 mg via INTRAVENOUS
  Filled 2016-02-04 (×7): qty 150

## 2016-02-04 MED ORDER — ONDANSETRON HCL 4 MG/2ML IJ SOLN
4.0000 mg | Freq: Four times a day (QID) | INTRAMUSCULAR | Status: DC | PRN
  Administered 2016-02-06: 4 mg via INTRAVENOUS

## 2016-02-04 MED ORDER — CLINDAMYCIN PHOSPHATE 600 MG/50ML IV SOLN: 600.0000 mg | Freq: Three times a day (TID) | INTRAVENOUS | Status: DC

## 2016-02-04 MED ORDER — ACETAMINOPHEN 325 MG PO TABS
650.0000 mg | ORAL_TABLET | Freq: Four times a day (QID) | ORAL | Status: DC | PRN
  Administered 2016-02-05: 650 mg via ORAL
  Filled 2016-02-04: qty 2

## 2016-02-04 MED ORDER — ALLOPURINOL 100 MG PO TABS
100.0000 mg | ORAL_TABLET | Freq: Every day | ORAL | Status: DC
  Administered 2016-02-04 – 2016-02-05 (×2): 100 mg via ORAL
  Filled 2016-02-04 (×2): qty 1

## 2016-02-04 MED ORDER — METOPROLOL TARTRATE 25 MG PO TABS
25.0000 mg | ORAL_TABLET | Freq: Two times a day (BID) | ORAL | Status: DC
  Administered 2016-02-04 – 2016-02-08 (×8): 25 mg via ORAL
  Filled 2016-02-04 (×8): qty 1

## 2016-02-04 MED ORDER — DIGOXIN 125 MCG PO TABS
0.1250 mg | ORAL_TABLET | Freq: Every day | ORAL | Status: DC
  Administered 2016-02-04 – 2016-02-08 (×5): 0.125 mg via ORAL
  Filled 2016-02-04 (×5): qty 1

## 2016-02-04 MED ORDER — ACETAMINOPHEN 650 MG RE SUPP: 650.0000 mg | Freq: Four times a day (QID) | RECTAL | Status: DC | PRN

## 2016-02-04 MED ORDER — HYDROCODONE-ACETAMINOPHEN 5-325 MG PO TABS: 1.0000 | ORAL_TABLET | ORAL | Status: DC | PRN

## 2016-02-04 NOTE — ED Notes (Signed)
Lab called concerning duplicate culture orders, will contact MD to verify and dc if appropriate.  Lab cultures were already drawn.

## 2016-02-04 NOTE — ED Notes (Signed)
Right middle finger was sutured at this facility per PT. Patient states "my dog started sniffin' Around it and now it's all hot, swollen and puss is coming out of it."  Patient's finger is swollen, red, and leaking exudate

## 2016-02-04 NOTE — ED Provider Notes (Signed)
[facility] Emergency Department Provider Note  ____________________________________________   First MD Initiated Contact with Patient 02/04/16 1814     (approximate)  I have reviewed the triage vital signs and the nursing notes.   HISTORY  Chief Complaint Wound Infection   HPI [name] is a 82 y.o. female with a history of coronary artery disease was presenting with right middle finger swelling and erythema. She says that she had a dislocation that was reduced 1 week ago and since then she has had increased pain as well as erythema and pus drainage from the area over the dislocation where she also had a laceration.  The patient also says that her right hand is swollen as well as her distal right forearm. She has not been taking any antibiotics. Also says that she did not receive a tetanus during her last visit it is on the date of her last tetanus shot.   Past Medical History:  Diagnosis Date  . Arthritis   . Atherosclerosis of abdominal aorta (HCC)    by xray  . Atrial fibrillation (HCC)   . CAD (coronary artery disease)    95% LAD stenosis, [place] x 02 September 1996  . Cervicalgia   . CHF (congestive heart failure) (HCC) 2014   on recent hospitalization  . Chronic interstitial lung disease (HCC) 08/2015   by CXR  . Depression with anxiety    celexa started 04/2010  . Dyslipidemia   . Facial trauma 06/01/2014   Fall 05/2014 without fracture by facial/head CT   . Gout    prior PCP stopped allopurinol  . Hyperlipidemia   . Hypertension   . LBP (low back pain) 2005   spinal stenosis and bulging discs at L2-L5 s/p lumbar laminectomy Dutch Quint)  . Lymphocytic colitis 10/28/2013  . Osteopenia 07/2013   T score -2.2 L femur  . Peripheral neuropathy (HCC) since feb 2015   right arm   . Prediabetes 2001  . Syncopal episodes 2015   orthostatic, normal EEG and neuro eval ([place])  . T12 compression fracture (HCC) 2015   by CT    Patient Active  Problem List   Diagnosis Date Noted  . Memory impairment 10/04/2015  . Chronic interstitial lung disease (HCC) 08/02/2015  . Advanced care planning/counseling discussion 04/14/2015  . Recurrent falls 04/14/2015  . Right shoulder pain 09/24/2014  . Candidal vulvovaginitis 05/26/2014  . Transient alteration of awareness 03/09/2014  . Chronic diastolic CHF (congestive heart failure) (HCC)   . Syncope 01/01/2014  . Syncope and collapse 01/01/2014  . Rib pain on left side 10/28/2013  . Lymphocytic colitis 10/28/2013  . Loss of weight 08/18/2013  . Medicare annual wellness visit, initial 06/11/2013  . Depression with anxiety   . Hypothyroidism 06/04/2013  . Atrial fibrillation (HCC)   . Peripheral neuropathy (HCC)   . Osteopenia   . LBP (low back pain)   . Gout   . Cramping of feet 07/19/2010  . Palpitations 07/19/2010  . EDEMA 01/19/2009  . HLD (hyperlipidemia) 07/02/2008  . Essential hypertension, benign 07/02/2008  . Coronary atherosclerosis 07/02/2008  . Rash and nonspecific skin eruption 07/02/2008    Past Surgical History:  Procedure Laterality Date  . CARDIAC CATHETERIZATION    . CATARACT EXTRACTION Bilateral   . COLONOSCOPY WITH PROPOFOL N/A 10/16/2013   Rachael Fee, MD  . DEXA  07/2013   T score -2.2 L femur  . LUMBAR LAMINECTOMY  2005   Dr. Dutch Quint  .  PERCUTANEOUS CORONARY STENT INTERVENTION (PCI-S)     stent x3 in heart  . TONSILLECTOMY    . TUBAL LIGATION      Prior to Admission medications   Medication Sig Start Date End Date Taking? Authorizing Provider  allopurinol (ZYLOPRIM) 100 MG tablet TAKE ONE TABLET EVERY DAY 01/07/16   Eustaquio Boyden, MD  aspirin EC 81 MG tablet Take 1 tablet (81 mg total) by mouth daily. 04/14/15   Eustaquio Boyden, MD  atorvastatin (LIPITOR) 40 MG tablet TAKE ONE TABLET EVERY DAY 10/12/15   Eustaquio Boyden, MD  DIGOX 125 MCG tablet TAKE ONE TABLET EVERY DAY 01/28/15   Eustaquio Boyden, MD  FLUoxetine (PROZAC) 20 MG capsule TAKE  1 CAPSULE BY MOUTH ONCE DAILY 03/02/15   Eustaquio Boyden, MD  furosemide (LASIX) 20 MG tablet Take 0.5 tablets (10 mg total) by mouth daily. 01/04/16   Rollene Rotunda, MD  lisinopril (PRINIVIL,ZESTRIL) 5 MG tablet TAKE 1 TABLET BY MOUTH DAILY 01/14/16   Eustaquio Boyden, MD  loperamide (IMODIUM) 2 MG capsule Take 2 mg by mouth as needed for diarrhea or loose stools.    Historical Provider, MD  metoprolol tartrate (LOPRESSOR) 25 MG tablet TAKE ONE TABLET BY MOUTH TWICE DAILY 01/21/15   Eustaquio Boyden, MD  Multiple Vitamins-Minerals (ONE-A-DAY WOMENS 50+ ADVANTAGE) TABS Take by mouth daily.    Historical Provider, MD  NITROSTAT 0.4 MG SL tablet PLACE 1 TABLET UNDER TONGUE EVERY 5 MIN AS NEEDED FOR CHEST PAIN IF NO RELIEF IN15 MIN CALL 911 (MAX 3 TABS) 01/16/14   Rollene Rotunda, MD  Oxycodone HCl 10 MG TABS Take 1 tablet by mouth as directed. 12/07/15   Historical Provider, MD  predniSONE (DELTASONE) 5 MG tablet Take 1 tablet by mouth as directed. 09/23/15   Historical Provider, MD  traMADol (ULTRAM) 50 MG tablet Take 1 tablet (50 mg total) by mouth every 12 (twelve) hours as needed for moderate pain. 01/29/16   Joni Reining, PA-C    Allergies Coumadin [warfarin sodium]; Adhesive [tape]; Ivp dye [iodinated diagnostic agents]; and Vytorin [ezetimibe-simvastatin]  Family History  Problem Relation Age of Onset  . Prostate cancer Father   . Breast cancer Sister 19  . Heart disease Brother   . Heart disease Other     First degree relatives with heart disease but later onset  . Heart disease Sister     pacer  . Esophageal cancer Neg Hx   . Colon cancer Neg Hx     Social History Social History  Substance Use Topics  . Smoking status: Never Smoker  . Smokeless tobacco: Never Used  . Alcohol use Yes     Comment: Occasionally drinks beer    Review of Systems Constitutional: Says with fever up to 102 in the evenings. Eyes: No visual changes. ENT: No sore throat. Cardiovascular: Denies  chest pain. Respiratory: Denies shortness of breath. Gastrointestinal: No abdominal pain.  No nausea, no vomiting.  No diarrhea.  No constipation. Genitourinary: Negative for dysuria. Musculoskeletal: Negative for back pain. Skin: Negative for rash. Neurological: Negative for headaches, focal weakness or numbness.  10-point ROS otherwise negative.  ____________________________________________   PHYSICAL EXAM:  VITAL SIGNS: ED Triage Vitals  Enc Vitals Group     BP 02/04/16 1617 (!) 142/66     Pulse Rate 02/04/16 1617 (!) 106     Resp 02/04/16 1617 16     Temp 02/04/16 1617 98.2 F (36.8 C)     Temp src --  SpO2 02/04/16 1617 96 %     Weight 02/04/16 1618 113 lb (51.3 kg)     Height 02/04/16 1618 5\' 4"  (1.626 m)     Head Circumference --      Peak Flow --      Pain Score 02/04/16 1625 8     Pain Loc --      Pain Edu? --      Excl. in GC? --     Constitutional: Alert and oriented. Well appearing and in no acute distress. Eyes: Conjunctivae are normal. PERRL. EOMI. Head: Atraumatic. Nose: No congestion/rhinnorhea. Mouth/Throat: Mucous membranes are moist.   Neck: No stridor.   Cardiovascular: Normal rate, regular rhythm. Grossly normal heart sounds.   Respiratory: Normal respiratory effort.  No retractions. Lungs CTAB. Gastrointestinal: Soft and nontender. No distention.  Musculoskeletal: Right middle finger with concentric swelling. Sensation is intact to light touch with brisk capillary refill. Finger is warm to touch over the volar aspect where there are stitches horizontally there is pus drainage with overlying tenderness to palpation but without any fluctuance. The erythema extends medially over the entire hand and to the distal forearm as well.  Patient says that she has difficulty moving the right middle finger but it is not held in flexion. Tenderness diffusely around the finger as well as over the flexor tendons.  Neurologic:  Normal speech and language. No  gross focal neurologic deficits are appreciated. Skin: See musculoskeletal exam above. Psychiatric: Mood and affect are normal. Speech and behavior are normal.  ____________________________________________   LABS (all labs ordered are listed, but only abnormal results are displayed)  Labs Reviewed  COMPREHENSIVE METABOLIC PANEL - Abnormal; Notable for the following:       Result Value   Potassium 2.8 (*)    Chloride 100 (*)    Glucose, Bld 111 (*)    All other components within normal limits  CBC WITH DIFFERENTIAL/PLATELET - Abnormal; Notable for the following:    RBC 3.74 (*)    MCV 100.1 (*)    MCH 34.3 (*)    Monocytes Absolute 1.1 (*)    All other components within normal limits  CULTURE, BLOOD (ROUTINE X 2)  CULTURE, BLOOD (ROUTINE X 2)   ____________________________________________  EKG   ____________________________________________  RADIOLOGY  DG Finger Middle Right (Accession [id number]) (Order [id number])  Imaging  Date: 02/04/2016 Department: [facility] EMERGENCY DEPARTMENT Released By/Authorizing: Myrna Blazeravid Matthew Schaevitz, MD (auto-released)  Exam Information   Status Exam Begun  Exam Ended   Final [99] 02/04/2016 6:39 PM 02/04/2016 6:56 PM  PACS Images   Show images for DG Finger Middle Right  Study Result   CLINICAL DATA:  Suspected infected finger with puss.  EXAM: RIGHT MIDDLE FINGER 2+V  COMPARISON:  RIGHT finger radiograph January 21, 2016  FINDINGS: Third digit soft tissue swelling, no subcutaneous gas or radiopaque foreign bodies. No fracture deformity or dislocation. No destructive bony lesions. Nonspecific calcification projecting in palm.  IMPRESSION: Soft tissue swelling, no acute osseous process.   Electronically Signed   By: Awilda Metroourtnay  Bloomer M.D.   On: 02/04/2016 19:03    ____________________________________________   PROCEDURES  Procedure(s) performed:   Procedures  Critical Care  performed:   ____________________________________________   INITIAL IMPRESSION / ASSESSMENT AND PLAN / ED COURSE  Pertinent labs & imaging results that were available during my care of the patient were reviewed by me and considered in my medical decision making (see chart for details).  ----------------------------------------- 7:27 PM  on 02/04/2016 -----------------------------------------  Discussed the case with Dr. Martha ClanKrasinski of the orthopedic service who recommends admission to the medicine service with IV antibiotics and that he would consult. I discussed the exam findings with him including the pus and the concentric swelling. He says that he would like to try IV antibiotic first before rushing her to the operating room because of her comorbidities and age.  I discussed the Kanavel's signs but he still believes that medical management would be the initial treatment of choice. Signed out to Dr. Pablo LawrenceKonadena of the medicine service.  Clinical Course     ____________________________________________   FINAL CLINICAL IMPRESSION(S) / ED DIAGNOSES  Wound infection. Cellulitis.    NEW MEDICATIONS STARTED DURING THIS VISIT:  New Prescriptions   No medications on file     Note:  This document was prepared using Dragon voice recognition software and may include unintentional dictation errors.    Myrna Blazeravid Matthew Schaevitz, MD 02/04/16 1929

## 2016-02-04 NOTE — ED Notes (Signed)
Pt transported to room 119A

## 2016-02-04 NOTE — ED Notes (Signed)
Called report x1, the RN was giving meds to another patient and will call me in a few minutes.

## 2016-02-04 NOTE — ED Notes (Signed)
Paged MD about duplicate orders x2.

## 2016-02-04 NOTE — Progress Notes (Signed)
Pharmacy Antibiotic Note  [name] is a 82 y.o. female admitted on 02/04/2016 with cellulitis of finger with pus, pain and redness.  Pharmacy has been consulted for vancomycin dosing.  Plan: Patient received vancomycin 1000 mg x 1. Will be continued on vancomycin 750 mg IV q 24 hours to be started ~12 hours after first dose (stacked dosing). Trough will be drawn prior to 5th dose (11/7 0730). Trough at steady state estimated to be ~15.  PK: VD: 36  ke = 0.042 T1/2 16.5  Height: 5\' 4"  (162.6 cm) Weight: 113 lb (51.3 kg) IBW/kg (Calculated) : 54.7  Temp (24hrs), Avg:98.2 F (36.8 C), Min:98.2 F (36.8 C), Max:98.2 F (36.8 C)   Recent Labs Lab 02/04/16 1629  WBC 9.7  CREATININE 0.65    Estimated Creatinine Clearance: 44.7 mL/min (by C-G formula based on SCr of 0.65 mg/dL).    Allergies  Allergen Reactions  . Coumadin [Warfarin Sodium] Other (See Comments)    Makes pass out  . Adhesive [Tape] Rash  . Ivp Dye [Iodinated Diagnostic Agents] Other (See Comments)    Rash and turns purple Rash and turns purple  . Vytorin [Ezetimibe-Simvastatin] Other (See Comments)    leg cramps    Antimicrobials this admission: Vanc 11/3 >>    Dose adjustments this admission:   Microbiology results: 11/03 BCx: sent  UCx:    Sputum:    MRSA PCR:   Thank you for allowing pharmacy to be a part of this patient's care.  Horris LatinoHolly Gilliam, PharmD Pharmacy Resident 02/04/2016 8:33 PM

## 2016-02-04 NOTE — Consult Note (Signed)
ORTHOPAEDIC CONSULTATION  REQUESTING PHYSICIAN: Katha HammingSnehalatha Konidena, MD  Chief Complaint: Pain, swelling and drainage right middle finger  HPI: [name] is a 82 y.o. female who complains of  pain and swelling and drainage from her right middle finger. She was seen previously at the ER on 01/29/2016. She had sustained a fall with a dorsal dislocation of the PIP joint. She has a volar laceration near the PIP joint crease. This was sutured by the ER on 01/29/2016. Patient returns to the ER today with complaints of swelling and drainage and pain to the right middle finger.   Past Medical History:  Diagnosis Date  . Arthritis   . Atherosclerosis of abdominal aorta (HCC)    by xray  . Atrial fibrillation (HCC)   . CAD (coronary artery disease)    95% LAD stenosis, Cypher x 02 September 1996  . Cervicalgia   . CHF (congestive heart failure) (HCC) 2014   on recent hospitalization  . Chronic interstitial lung disease (HCC) 08/2015   by CXR  . Depression with anxiety    celexa started 04/2010  . Dyslipidemia   . Facial trauma 06/01/2014   Fall 05/2014 without fracture by facial/head CT   . Gout    prior PCP stopped allopurinol  . Hyperlipidemia   . Hypertension   . LBP (low back pain) 2005   spinal stenosis and bulging discs at L2-L5 s/p lumbar laminectomy Dutch Quint([place])  . Lymphocytic colitis 10/28/2013  . Osteopenia 07/2013   T score -2.2 L femur  . Peripheral neuropathy (HCC) since feb 2015   right arm   . Prediabetes 2001  . Syncopal episodes 2015   orthostatic, normal EEG and neuro eval ([place])  . T12 compression fracture (HCC) 2015   by CT   Past Surgical History:  Procedure Laterality Date  . CARDIAC CATHETERIZATION    . CATARACT EXTRACTION Bilateral   . COLONOSCOPY WITH PROPOFOL N/A 10/16/2013   Rachael Feeaniel P Jacobs, MD  . DEXA  07/2013   T score -2.2 L femur  . LUMBAR LAMINECTOMY  2005   Dr. Dutch QuintPoole  . PERCUTANEOUS CORONARY STENT INTERVENTION (PCI-S)     stent x3 in heart  .  TONSILLECTOMY    . TUBAL LIGATION     Social History   Social History  . Marital status: Widowed    Spouse name: N/A  . Number of children: 1  . Years of education: N/A   Occupational History  .  Retired   Social History Main Topics  . Smoking status: Never Smoker  . Smokeless tobacco: Never Used  . Alcohol use Yes     Comment: Occasionally drinks beer  . Drug use: No  . Sexual activity: Not Asked   Other Topics Concern  . 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 Prostate cancer Father   . Breast cancer Sister [id number]  . Heart disease Brother   . Heart disease Other     First degree relatives with heart disease but later onset  . Heart disease Sister     pacer  . Esophageal cancer Neg Hx   . Colon cancer Neg Hx    Allergies  Allergen Reactions  . Coumadin [Warfarin Sodium] Other (See Comments)    Makes pass out  .  Adhesive [Tape] Rash  . Ivp Dye [Iodinated Diagnostic Agents] Other (See Comments)    Rash and turns purple Rash and turns purple  . Vytorin [Ezetimibe-Simvastatin] Other (See Comments)    leg cramps   Prior to Admission medications   Medication Sig Start Date End Date Taking? Authorizing Provider  allopurinol (ZYLOPRIM) 100 MG tablet Take 100 mg by mouth daily.   Yes Historical Provider, MD  aspirin EC 81 MG tablet Take 1 tablet (81 mg total) by mouth daily. 04/14/15  Yes Eustaquio BoydenJavier Gutierrez, MD  atorvastatin (LIPITOR) 40 MG tablet Take 40 mg by mouth daily.   Yes Historical Provider, MD  digoxin (LANOXIN) 0.125 MG tablet Take 0.125 mg by mouth daily.   Yes Historical Provider, MD  FLUoxetine (PROZAC) 20 MG capsule Take 20 mg by mouth daily.   Yes Historical Provider, MD  furosemide (LASIX) 20 MG tablet Take 0.5 tablets (10 mg total) by mouth daily. Patient taking  differently: Take 20 mg by mouth daily as needed for edema.  01/04/16  Yes Rollene RotundaJames Hochrein, MD  lisinopril (PRINIVIL,ZESTRIL) 5 MG tablet Take 5 mg by mouth daily.   Yes Historical Provider, MD  metoprolol tartrate (LOPRESSOR) 25 MG tablet Take 25 mg by mouth 2 (two) times daily.   Yes Historical Provider, MD  Multiple Vitamins-Minerals (ONE-A-DAY WOMENS 50+ ADVANTAGE) TABS Take 1 tablet by mouth daily.    Yes Historical Provider, MD  nitroGLYCERIN (NITROSTAT) 0.4 MG SL tablet Place 0.4 mg under the tongue every 5 (five) minutes as needed for chest pain.   Yes Historical Provider, MD  traMADol (ULTRAM) 50 MG tablet Take 1 tablet (50 mg total) by mouth every 12 (twelve) hours as needed for moderate pain. Patient not taking: Reported on 02/04/2016 01/29/16   Joni Reiningonald K Smith, PA-C   Dg Finger Middle Right  Result Date: 02/04/2016 CLINICAL DATA:  Suspected infected finger with puss. EXAM: RIGHT MIDDLE FINGER 2+V COMPARISON:  RIGHT finger radiograph January 21, 2016 FINDINGS: Third digit soft tissue swelling, no subcutaneous gas or radiopaque foreign bodies. No fracture deformity or dislocation. No destructive bony lesions. Nonspecific calcification projecting in palm. IMPRESSION: Soft tissue swelling, no acute osseous process. Electronically Signed   By: Awilda Metroourtnay  Bloomer M.D.   On: 02/04/2016 19:03    Positive ROS: All other systems have been reviewed and were otherwise negative with the exception of those mentioned in the HPI and as above.  Physical Exam: General: Alert, no acute distress  MUSCULOSKELETAL: Right middle finger: Patient's sutures are in place. There is slight dehiscence of her wound with a small amount of purulent drainage. She has moderate swelling throughout the right middle finger. Her finger is perfused. She states she has decreased sensation to light touch. She has difficulty flexing due to the swelling. The finger over the PIP joint is tender.  Assessment: Cellulitis of right  middle finger  Plan: I splinted the patient that she has an infection of her finger. I have discussed this case with the hospitalist. The hospitalist service is admitting for IV antibiotics. She received a dose of IV vancomycin in the ER. She has ordered for a tetanus shot. She is being admitted and placed on IV clindamycin. I will continue to watch the patient's response to IV antibiotics. An I&D would be performed if she does not respond to the antibiotics. Patient states that she has stents in her heart and does not feel that she is a good surgical candidate. She does not want to proceed with surgery  if at all possible. Patient does not have an elevated white count. She is showing no signs of sepsis. There is no streaking erythema. The infection is isolated to the right middle finger and does not extend into her the rest of her hand. I will continue to follow closely.    Juanell Fairly, MD    02/04/2016 8:24 PM

## 2016-02-04 NOTE — ED Triage Notes (Addendum)
Pt  Seen in ED 1 week ago for laceration , sutures in right ring finger, finger is red, swollen, white drainage seeping from sutures, pt reports fevers of 102 at night, pt  Reports hand has been swollen since leaving ED last week

## 2016-02-04 NOTE — Progress Notes (Signed)
Patient's bed is outside of the room ED1 where chaplain responded to a code stroke. Since chaplain could not get in the Ed1, he felt the need to talk with a patient who was lonely outside in the hallway. Patient appreciated chaplain's visit. She shared with chaplain about her love for animals and about the pets she has. She was suffering from an infection in her third(ring) finger. She was a sweet patient and she introduced the chaplain on call to her son when chaplain went back to ED for a follow up.

## 2016-02-04 NOTE — H&P (Signed)
[facility] [place] [facility]   PATIENT NAME: [name]    MR#:  [id number]  DATE OF BIRTH:  02/12/1933  DATE OF ADMISSION:  02/04/2016  PRIMARY CARE PHYSICIAN: Eustaquio BoydenJavier Gutierrez, MD   REQUESTING/REFERRING PHYSICIAN: Dr. Langston MaskerShaevitz  CHIEF COMPLAINT: Wound infection    Chief Complaint  Patient presents with  . Wound Infection    HISTORY OF PRESENT ILLNESS:  Sherry Grumblinglizabeth Kramme  is a 82 y.o. female with a known history of Essential hypertension, chronic atrial fibrillation, coronary artery disease, lymphocyte colitis comes in because of right middle finger swelling, pain, fever, decreased range of motion of all the fingers on the right hand. Patient was here a week ago for right thirdfingerlaceration because of the fall. Patient still had anatomical reduction of the PIP joint of the right third finger that time. Patient did have stitches for the laceration that time. And she was not given antibiotic on October 28 when she was here that time. Today she says that she's been having lots of pain, redness, decreased range of motion of all the fingers, pus coming out of the sutures. And patient also had a fever at home. Dr. Martha ClanKrasinski from orthopedic recommended admission to medicine for cellulitis, because of her age, coronary artery disease advised medical management with IV antibiotics.  PAST MEDICAL HISTORY:   Past Medical History:  Diagnosis Date  . Arthritis   . Atherosclerosis of abdominal aorta (HCC)    by xray  . Atrial fibrillation (HCC)   . CAD (coronary artery disease)    95% LAD stenosis, [place] x 02 September 1996  . Cervicalgia   . CHF (congestive heart failure) (HCC) 2014   on recent hospitalization  . Chronic interstitial lung disease (HCC) 08/2015   by CXR  . Depression with anxiety    celexa started 04/2010  . Dyslipidemia   . Facial trauma 06/01/2014   Fall 05/2014 without fracture by facial/head CT   . Gout    prior PCP stopped allopurinol   . Hyperlipidemia   . Hypertension   . LBP (low back pain) 2005   spinal stenosis and bulging discs at L2-L5 s/p lumbar laminectomy Dutch Quint([place])  . Lymphocytic colitis 10/28/2013  . Osteopenia 07/2013   T score -2.2 L femur  . Peripheral neuropathy (HCC) since feb 2015   right arm   . Prediabetes 2001  . Syncopal episodes 2015   orthostatic, normal EEG and neuro eval ([place])  . T12 compression fracture (HCC) 2015   by CT    PAST SURGICAL HISTOIRY:   Past Surgical History:  Procedure Laterality Date  . CARDIAC CATHETERIZATION    . CATARACT EXTRACTION Bilateral   . COLONOSCOPY WITH PROPOFOL N/A 10/16/2013   Rachael Feeaniel P Jacobs, MD  . DEXA  07/2013   T score -2.2 L femur  . LUMBAR LAMINECTOMY  2005   Dr. Dutch QuintPoole  . PERCUTANEOUS CORONARY STENT INTERVENTION (PCI-S)     stent x3 in heart  . TONSILLECTOMY    . TUBAL LIGATION      SOCIAL HISTORY:   Social History  Substance Use Topics  . Smoking status: Never Smoker  . Smokeless tobacco: Never Used  . Alcohol use Yes     Comment: Occasionally drinks beer    FAMILY HISTORY:   Family History  Problem Relation Age of Onset  . Prostate cancer Father   . Breast cancer Sister [id number]  . Heart disease Brother   . Heart disease Other  First degree relatives with heart disease but later onset  . Heart disease Sister     pacer  . Esophageal cancer Neg Hx   . Colon cancer Neg Hx     DRUG ALLERGIES:   Allergies  Allergen Reactions  . Coumadin [Warfarin Sodium] Other (See Comments)    Makes pass out  . Adhesive [Tape] Rash  . Ivp Dye [Iodinated Diagnostic Agents] Other (See Comments)    Rash and turns purple Rash and turns purple  . Vytorin [Ezetimibe-Simvastatin] Other (See Comments)    leg cramps    REVIEW OF SYSTEMS:  CONSTITUTIONAL: fever EYES: No blurred or double vision.  EARS, NOSE, AND THROAT: No tinnitus or ear pain.  RESPIRATORY: No cough, shortness of breath, wheezing or hemoptysis.  CARDIOVASCULAR: No chest pain,  orthopnea, edema.  GASTROINTESTINAL: No nausea, vomiting, diarrhea or abdominal pain. And says that she has chronic diarrhea, follows up with gastroenterology. GENITOURINARY: No dysuria, hematuria.  ENDOCRINE: No polyuria, nocturia,  HEMATOLOGY: No anemia, easy bruising or bleeding SKIN: No rash or lesion. MUSCULOSKELETAL: right hand  Middle finger swelling NEUROLOGIC: No tingling, numbness, weakness.  PSYCHIATRY: No anxiety or depression.   MEDICATIONS AT HOME:   Prior to Admission medications   Medication Sig Start Date End Date Taking? Authorizing Provider  allopurinol (ZYLOPRIM) 100 MG tablet Take 100 mg by mouth daily.   Yes Historical Provider, MD  aspirin EC 81 MG tablet Take 1 tablet (81 mg total) by mouth daily. 04/14/15  Yes Eustaquio BoydenJavier Gutierrez, MD  atorvastatin (LIPITOR) 40 MG tablet Take 40 mg by mouth daily.   Yes Historical Provider, MD  digoxin (LANOXIN) 0.125 MG tablet Take 0.125 mg by mouth daily.   Yes Historical Provider, MD  FLUoxetine (PROZAC) 20 MG capsule Take 20 mg by mouth daily.   Yes Historical Provider, MD  furosemide (LASIX) 20 MG tablet Take 0.5 tablets (10 mg total) by mouth daily. Patient taking differently: Take 20 mg by mouth daily as needed for edema.  01/04/16  Yes Rollene RotundaJames Hochrein, MD  lisinopril (PRINIVIL,ZESTRIL) 5 MG tablet Take 5 mg by mouth daily.   Yes Historical Provider, MD  metoprolol tartrate (LOPRESSOR) 25 MG tablet Take 25 mg by mouth 2 (two) times daily.   Yes Historical Provider, MD  Multiple Vitamins-Minerals (ONE-A-DAY WOMENS 50+ ADVANTAGE) TABS Take 1 tablet by mouth daily.    Yes Historical Provider, MD  nitroGLYCERIN (NITROSTAT) 0.4 MG SL tablet Place 0.4 mg under the tongue every 5 (five) minutes as needed for chest pain.   Yes Historical Provider, MD  traMADol (ULTRAM) 50 MG tablet Take 1 tablet (50 mg total) by mouth every 12 (twelve) hours as needed for moderate pain. Patient not taking: Reported on 02/04/2016 01/29/16   Joni Reiningonald K Smith,  PA-C      VITAL SIGNS:  Blood pressure (!) 142/66, pulse (!) 106, temperature 98.2 F (36.8 C), resp. rate 16, height 5\' 4"  (1.626 m), weight 51.3 kg (113 lb), SpO2 96 %.  PHYSICAL EXAMINATION:  GENERAL:  82 y.o.-year-old patient lying in the bed with no acute distress.  EYES: Pupils equal, round, reactive to light and accommodation. No scleral icterus. Extraocular muscles intact.  HEENT: Head atraumatic, normocephalic. Oropharynx and nasopharynx clear.  NECK:  Supple, no jugular venous distention. No thyroid enlargement, no tenderness.  LUNGS: Normal breath sounds bilaterally, no wheezing, rales,rhonchi or crepitation. No use of accessory muscles of respiration.  CARDIOVASCULAR: S1, S2 normal. No murmurs, rubs, or gallops.  ABDOMEN: Soft, nontender, nondistended. Bowel  sounds present. No organomegaly or mass.  EXTREMITIES:  Has right hand middle finger swelling, erythema, decreased range of motion, warm to touch, pus coming out of stitches from the lower part of right hand middle finger. A little tender. She says swelling including the dorsum of the right hand today.  NEUROLOGIC: Cranial nerves II through XII are intact. Muscle strength 5/5 in all extremities. Sensation intact. Gait not checked.  PSYCHIATRIC: The patient is alert and oriented x 3.  SKIN: No obvious rash, lesion, or ulcer.   LABORATORY PANEL:   CBC  Recent Labs Lab 02/04/16 1629  WBC 9.7  HGB 12.8  HCT 37.4  PLT 227   ------------------------------------------------------------------------------------------------------------------  Chemistries   Recent Labs Lab 02/04/16 1629  NA 138  K 2.8*  CL 100*  CO2 25  GLUCOSE 111*  BUN 8  CREATININE 0.65  CALCIUM 9.1  AST 28  ALT 15  ALKPHOS 123  BILITOT 0.8   ------------------------------------------------------------------------------------------------------------------  Cardiac Enzymes No results for input(s): TROPONINI in the last 168  hours. ------------------------------------------------------------------------------------------------------------------  RADIOLOGY:  Dg Finger Middle Right  Result Date: 02/04/2016 CLINICAL DATA:  Suspected infected finger with puss. EXAM: RIGHT MIDDLE FINGER 2+V COMPARISON:  RIGHT finger radiograph January 21, 2016 FINDINGS: Third digit soft tissue swelling, no subcutaneous gas or radiopaque foreign bodies. No fracture deformity or dislocation. No destructive bony lesions. Nonspecific calcification projecting in palm. IMPRESSION: Soft tissue swelling, no acute osseous process. Electronically Signed   By: Awilda Metro M.D.   On: 02/04/2016 19:03    EKG:   Orders placed or performed in visit on 07/05/15  . EKG 12-Lead    IMPRESSION AND PLAN:   Cellulitis of the right middle finger: Admitted to medical service, started on IV clindamycin, follow blood cultures, Dr.  Nyoka Lint  From ortho is  following the patient, #2 hypokalemia  due to diuretics: Hold the Lasix, replace the potassium.monitor on off unit tele 3/.chronic atrial fibrillation and history of syncope: Patient refused anticoagulation with ELIQUIS. And has h/o falls.so not taking  Coumadin. And patient refuses Coumadin. And the patient is seen by [facility] cardiology, and metoprolol, aspirin, atorvastatin #4 history of lymphocytic colitis: Patient did take steroids, she is on Imodium as needed. 5.GI and DVT prophylaxis All the records are reviewed and case discussed with ED provider. Management plans discussed with the patient, family and they are in agreement.  CODE STATUS: full  TOTAL TIME TAKING CARE OF THIS PATIENT: 55 minutes.    Katha Hamming M.D on 02/04/2016 at 8:10 PM  Between 7am to 6pm - Pager - [phone]  After 6pm go to [email or web address] - password [email or web address]  [facility]  Office  [phone]  CC: Primary care physician; Eustaquio Boyden, MD  Note: This dictation was prepared  with Dragon dictation along with smaller phrase technology. Any transcriptional errors that result from this process are unintentional.

## 2016-02-05 ENCOUNTER — Other Ambulatory Visit: Payer: Self-pay | Admitting: Family Medicine

## 2016-02-05 LAB — BASIC METABOLIC PANEL
ANION GAP: 8 (ref 5–15)
BUN: 9 mg/dL (ref 6–20)
CALCIUM: 8.2 mg/dL — AB (ref 8.9–10.3)
CO2: 24 mmol/L (ref 22–32)
Chloride: 107 mmol/L (ref 101–111)
Creatinine, Ser: 0.78 mg/dL (ref 0.44–1.00)
Glucose, Bld: 112 mg/dL — ABNORMAL HIGH (ref 65–99)
POTASSIUM: 3.3 mmol/L — AB (ref 3.5–5.1)
Sodium: 139 mmol/L (ref 135–145)

## 2016-02-05 LAB — CBC
HEMATOCRIT: 33.1 % — AB (ref 35.0–47.0)
HEMOGLOBIN: 11.9 g/dL — AB (ref 12.0–16.0)
MCH: 35.6 pg — ABNORMAL HIGH (ref 26.0–34.0)
MCHC: 35.8 g/dL (ref 32.0–36.0)
MCV: 99.3 fL (ref 80.0–100.0)
Platelets: 191 10*3/uL (ref 150–440)
RBC: 3.33 MIL/uL — AB (ref 3.80–5.20)
RDW: 12.5 % (ref 11.5–14.5)
WBC: 7.9 10*3/uL (ref 3.6–11.0)

## 2016-02-05 LAB — GLUCOSE, CAPILLARY: GLUCOSE-CAPILLARY: 102 mg/dL — AB (ref 65–99)

## 2016-02-05 MED ORDER — SODIUM CHLORIDE 0.9% FLUSH
3.0000 mL | INTRAVENOUS | Status: DC | PRN
  Administered 2016-02-05: 3 mL via INTRAVENOUS

## 2016-02-05 MED ORDER — SODIUM CHLORIDE 0.9% FLUSH: 3.0000 mL | Freq: Two times a day (BID) | INTRAVENOUS | Status: DC

## 2016-02-05 MED ORDER — LOPERAMIDE HCL 2 MG PO CAPS
2.0000 mg | ORAL_CAPSULE | ORAL | Status: DC | PRN
  Administered 2016-02-05: 18:00:00 2 mg via ORAL
  Filled 2016-02-05 (×2): qty 1

## 2016-02-05 MED ORDER — PIPERACILLIN-TAZOBACTAM 3.375 G IVPB
3.3750 g | Freq: Three times a day (TID) | INTRAVENOUS | Status: DC
  Administered 2016-02-05 – 2016-02-08 (×9): 3.375 g via INTRAVENOUS
  Filled 2016-02-05 (×9): qty 50

## 2016-02-05 NOTE — Progress Notes (Signed)
[facility] - [place] at [facility]   PATIENT NAME: [name]    MRN#:  [id number]  DATE OF BIRTH:  1932-11-21  SUBJECTIVE:  Hospital Day: 1 day [name] is a 82 y.o. female presenting with Wound Infection .   Overnight events: No acute overnight events Interval Events: Minimal pain in her right finger worse with activity, purulent drainage still present  REVIEW OF SYSTEMS:  CONSTITUTIONAL: No fever, fatigue or weakness.  EYES: No blurred or double vision.  EARS, NOSE, AND THROAT: No tinnitus or ear pain.  RESPIRATORY: No cough, shortness of breath, wheezing or hemoptysis.  CARDIOVASCULAR: No chest pain, orthopnea, edema.  GASTROINTESTINAL: No nausea, vomiting, diarrhea or abdominal pain.  GENITOURINARY: No dysuria, hematuria.  ENDOCRINE: No polyuria, nocturia,  HEMATOLOGY: No anemia, easy bruising or bleeding SKIN: No rash or lesion. MUSCULOSKELETAL: No joint pain or arthritis.   NEUROLOGIC: No tingling, numbness, weakness.  PSYCHIATRY: No anxiety or depression.   DRUG ALLERGIES:   Allergies  Allergen Reactions  . Coumadin [Warfarin Sodium] Other (See Comments)    Makes pass out  . Adhesive [Tape] Rash  . Ivp Dye [Iodinated Diagnostic Agents] Other (See Comments)    Rash and turns purple Rash and turns purple  . Vytorin [Ezetimibe-Simvastatin] Other (See Comments)    leg cramps    VITALS:  Blood pressure (!) 156/63, pulse 60, temperature 98.2 F (36.8 C), temperature source Oral, resp. rate 20, height 5\' 4"  (1.626 m), weight 51.3 kg (113 lb), SpO2 100 %.  PHYSICAL EXAMINATION:  VITAL SIGNS: Vitals:   02/05/16 0450 02/05/16 0750  BP: (!) 160/54 (!) 156/63  Pulse: 74 60  Resp: 18 20  Temp: 98.1 F (36.7 C) 98.2 F (36.8 C)   GENERAL:83 y.o.female currently in no acute distress.  HEAD: Normocephalic, atraumatic.  EYES: Pupils equal, round, reactive to light. Extraocular muscles intact. No scleral icterus.  MOUTH: Moist  mucosal membrane. Dentition intact. No abscess noted.  EAR, NOSE, THROAT: Clear without exudates. No external lesions.  NECK: Supple. No thyromegaly. No nodules. No JVD.  PULMONARY: Clear to ascultation, without wheeze rails or rhonci. No use of accessory muscles, Good respiratory effort. good air entry bilaterally CHEST: Nontender to palpation.  CARDIOVASCULAR: S1 and S2. Regular rate and rhythm. No murmurs, rubs, or gallops. No edema. Pedal pulses 2+ bilaterally.  GASTROINTESTINAL: Soft, nontender, nondistended. No masses. Positive bowel sounds. No hepatosplenomegaly.  MUSCULOSKELETAL: No swelling, clubbing, or edema. Range of motion full in all extremities.  NEUROLOGIC: Cranial nerves II through XII are intact. No gross focal neurological deficits. Sensation intact. Reflexes intact.  SKIN: Right middle finger with bloody drainage coming gross erythema as well as surrounding edema otherwise No ulceration, lesions, rashes, or cyanosis. Skin warm and dry. Turgor intact.  PSYCHIATRIC: Mood, affect within normal limits. The patient is awake, alert and oriented x 3. Insight, judgment intact.      LABORATORY PANEL:   CBC  Recent Labs Lab 02/05/16 0459  WBC 7.9  HGB 11.9*  HCT 33.1*  PLT 191   ------------------------------------------------------------------------------------------------------------------  Chemistries   Recent Labs Lab 02/04/16 1629 02/05/16 0459  NA 138 139  K 2.8* 3.3*  CL 100* 107  CO2 25 24  GLUCOSE 111* 112*  BUN 8 9  CREATININE 0.65 0.78  CALCIUM 9.1 8.2*  AST 28  --   ALT 15  --   ALKPHOS 123  --   BILITOT 0.8  --    ------------------------------------------------------------------------------------------------------------------  Cardiac Enzymes No results  for input(s): TROPONINI in the last 168 hours. ------------------------------------------------------------------------------------------------------------------  RADIOLOGY:  Dg Finger  Middle Right  Result Date: 02/04/2016 CLINICAL DATA:  Suspected infected finger with puss. EXAM: RIGHT MIDDLE FINGER 2+V COMPARISON:  RIGHT finger radiograph January 21, 2016 FINDINGS: Third digit soft tissue swelling, no subcutaneous gas or radiopaque foreign bodies. No fracture deformity or dislocation. No destructive bony lesions. Nonspecific calcification projecting in palm. IMPRESSION: Soft tissue swelling, no acute osseous process. Electronically Signed   By: Awilda Metro M.D.   On: 02/04/2016 19:03    EKG:   Orders placed or performed in visit on 07/05/15  . EKG 12-Lead    ASSESSMENT AND PLAN:   [name] is a 82 y.o. female presenting with Wound Infection . Admitted 02/04/2016 : Day #: 1 day 1. Cellulitis right middle finger: Continue current antibiotic regimen to currently on vancomycin, and blood work and vital signs. Be reassuring however still significant erythema and swelling will discuss case with orthopedic surgery.  2. Hypokalemia: Replace 4-5 3. Hypomagnesemia: Replace greater than 2 4. Lymphocytic colitis: Imodium   All the records are reviewed and case discussed with Care Management/Social Workerr. Management plans discussed with the patient, family and they are in agreement.  CODE STATUS: full TOTAL TIME TAKING CARE OF THIS PATIENT: 33 minutes.   POSSIBLE D/C IN 1DAYS, DEPENDING ON CLINICAL CONDITION.   Denham Mose,  Mardi Mainland.D on 02/05/2016 at 1:22 PM  Between 7am to 6pm - Pager - [phone]  After 6pm: House Pager: - [phone]  [facility]  Office  [phone]  CC: Primary care physician; Eustaquio Boyden, MD

## 2016-02-05 NOTE — Progress Notes (Signed)
Subjective:  Patient admitted last night with a wound infection after a middle finger PIP dislocation with volar laceration.  Patient had laceration sutured by the ED staff. She returns 5 days later with pain, redness and swelling and drainage from her right middle finger. Patient reports pain as moderate.  She was given a dose of vancomycin in the ER. She was admitted on clindamycin which has been switched to vancomycin.  Objective:   VITALS:   Vitals:   02/04/16 2151 02/05/16 0450 02/05/16 0500 02/05/16 0750  BP: (!) 152/68 (!) 160/54  (!) 156/63  Pulse: 97 74  60  Resp: 16 18  20   Temp: 97.8 F (36.6 C) 98.1 F (36.7 C)  98.2 F (36.8 C)  TempSrc: Oral Oral  Oral  SpO2: 99% 98%  100%  Weight:   51.3 kg (113 lb)   Height:        PHYSICAL EXAM:  Right middle finger: Patient's drainage has slowed. She has erythema and swelling over the proximal phalanx particularly over the volar surface. There is mild to moderate swelling throughout the right middle finger. The finger is well-perfused. There is no areas of necrosis.  The patient states she has diminished sensation to light touch in the right middle finger.   LABS  Results for orders placed or performed during the hospital encounter of 02/04/16 (from the past 24 hour(s))  Comprehensive metabolic panel     Status: Abnormal   Collection Time: 02/04/16  4:29 PM  Result Value Ref Range   Sodium 138 135 - 145 mmol/L   Potassium 2.8 (L) 3.5 - 5.1 mmol/L   Chloride 100 (L) 101 - 111 mmol/L   CO2 25 22 - 32 mmol/L   Glucose, Bld 111 (H) 65 - 99 mg/dL   BUN 8 6 - 20 mg/dL   Creatinine, Ser 1.610.65 0.44 - 1.00 mg/dL   Calcium 9.1 8.9 - 09.610.3 mg/dL   Total Protein 7.3 6.5 - 8.1 g/dL   Albumin 3.9 3.5 - 5.0 g/dL   AST 28 15 - 41 U/L   ALT 15 14 - 54 U/L   Alkaline Phosphatase 123 38 - 126 U/L   Total Bilirubin 0.8 0.3 - 1.2 mg/dL   GFR calc non Af Amer >60 >60 mL/min   GFR calc Af Amer >60 >60 mL/min   Anion gap 13 5 - 15   Culture, blood (Routine X 2)     Status: None (Preliminary result)   Collection Time: 02/04/16  4:29 PM  Result Value Ref Range   Specimen Description BLOOD LEFT WRIST    Special Requests BOTTLES DRAWN AEROBIC AND ANAEROBIC  6CC    Culture NO GROWTH < 24 HOURS    Report Status PENDING   Culture, blood (Routine X 2)     Status: None (Preliminary result)   Collection Time: 02/04/16  4:29 PM  Result Value Ref Range   Specimen Description BLOOD RIGHT FOREARM    Special Requests BOTTLES DRAWN AEROBIC AND ANAEROBIC  6CC    Culture NO GROWTH < 24 HOURS    Report Status PENDING   CBC with Differential     Status: Abnormal   Collection Time: 02/04/16  4:29 PM  Result Value Ref Range   WBC 9.7 3.6 - 11.0 K/uL   RBC 3.74 (L) 3.80 - 5.20 MIL/uL   Hemoglobin 12.8 12.0 - 16.0 g/dL   HCT 04.537.4 40.935.0 - 81.147.0 %   MCV 100.1 (H) 80.0 - 100.0 fL  MCH 34.3 (H) 26.0 - 34.0 pg   MCHC 34.2 32.0 - 36.0 g/dL   RDW 29.512.5 62.111.5 - 30.814.5 %   Platelets 227 150 - 440 K/uL   Neutrophils Relative % 66 %   Neutro Abs 6.4 1.4 - 6.5 K/uL   Lymphocytes Relative 16 %   Lymphs Abs 1.5 1.0 - 3.6 K/uL   Monocytes Relative 12 %   Monocytes Absolute 1.1 (H) 0.2 - 0.9 K/uL   Eosinophils Relative 5 %   Eosinophils Absolute 0.4 0 - 0.7 K/uL   Basophils Relative 1 %   Basophils Absolute 0.1 0 - 0.1 K/uL  Basic metabolic panel     Status: Abnormal   Collection Time: 02/05/16  4:59 AM  Result Value Ref Range   Sodium 139 135 - 145 mmol/L   Potassium 3.3 (L) 3.5 - 5.1 mmol/L   Chloride 107 101 - 111 mmol/L   CO2 24 22 - 32 mmol/L   Glucose, Bld 112 (H) 65 - 99 mg/dL   BUN 9 6 - 20 mg/dL   Creatinine, Ser 6.570.78 0.44 - 1.00 mg/dL   Calcium 8.2 (L) 8.9 - 10.3 mg/dL   GFR calc non Af Amer >60 >60 mL/min   GFR calc Af Amer >60 >60 mL/min   Anion gap 8 5 - 15  CBC     Status: Abnormal   Collection Time: 02/05/16  4:59 AM  Result Value Ref Range   WBC 7.9 3.6 - 11.0 K/uL   RBC 3.33 (L) 3.80 - 5.20 MIL/uL   Hemoglobin 11.9  (L) 12.0 - 16.0 g/dL   HCT 84.633.1 (L) 96.235.0 - 95.247.0 %   MCV 99.3 80.0 - 100.0 fL   MCH 35.6 (H) 26.0 - 34.0 pg   MCHC 35.8 32.0 - 36.0 g/dL   RDW 84.112.5 32.411.5 - 40.114.5 %   Platelets 191 150 - 440 K/uL  Glucose, capillary     Status: Abnormal   Collection Time: 02/05/16  7:42 AM  Result Value Ref Range   Glucose-Capillary 102 (H) 65 - 99 mg/dL    Dg Finger Middle Right  Result Date: 02/04/2016 CLINICAL DATA:  Suspected infected finger with puss. EXAM: RIGHT MIDDLE FINGER 2+V COMPARISON:  RIGHT finger radiograph January 21, 2016 FINDINGS: Third digit soft tissue swelling, no subcutaneous gas or radiopaque foreign bodies. No fracture deformity or dislocation. No destructive bony lesions. Nonspecific calcification projecting in palm. IMPRESSION: Soft tissue swelling, no acute osseous process. Electronically Signed   By: Awilda Metroourtnay  Bloomer M.D.   On: 02/04/2016 19:03    Assessment/Plan:     Active Problems:   Cellulitis  Patient's white count is down today. She is afebrile. She has no signs or symptoms of systemic infection. There is no ascending erythema. There is no extension to the area of infection.  However there does not appear to be significant clinical change overnight. I'm going to add Zosyn to her antibiotics and recommend she stay another 24 hours for IV antibiotic treatment. If she still has not improved significantly by tomorrow morning and I&D of the right middle finger in the OR would be considered. I'm going to make the patient nothing by mouth after midnight in case the decision is made for surgery. Patient has low potassium which will be repleted. Blood cultures are pending. So far no growth to date.    Juanell FairlyKRASINSKI, Delicia Berens , MD 02/05/2016, 1:09 PM

## 2016-02-05 NOTE — Progress Notes (Signed)
Dr. Clint GuyHower changed pt's dsg today. Tramadol po given effective for pain. Tylenol given x 1 for finger pain. Pt to be  held NPO after 12 MN for possible surgical intervention with pt education done.

## 2016-02-06 ENCOUNTER — Encounter
Admission: EM | Disposition: A | Discharge status: Home Health | Payer: Self-pay | Source: Home / Self Care | Attending: Internal Medicine

## 2016-02-06 ENCOUNTER — Inpatient Hospital Stay: Payer: PPO | Admitting: Anesthesiology

## 2016-02-06 ENCOUNTER — Encounter: Payer: Self-pay | Admitting: Anesthesiology

## 2016-02-06 HISTORY — PX: INCISION AND DRAINAGE: SHX5863

## 2016-02-06 LAB — GLUCOSE, CAPILLARY: Glucose-Capillary: 95 mg/dL (ref 65–99)

## 2016-02-06 SURGERY — INCISION AND DRAINAGE
Anesthesia: General | Laterality: Right | Wound class: Dirty or Infected

## 2016-02-06 MED ORDER — POLYETHYLENE GLYCOL 3350 17 G PO PACK: 17.0000 g | PACK | Freq: Every day | ORAL | Status: DC | PRN

## 2016-02-06 MED ORDER — BISACODYL 10 MG RE SUPP: 10.0000 mg | Freq: Every day | RECTAL | Status: DC | PRN

## 2016-02-06 MED ORDER — HYDROCODONE-ACETAMINOPHEN 5-325 MG PO TABS
1.0000 | ORAL_TABLET | ORAL | Status: DC | PRN
  Administered 2016-02-07 – 2016-02-08 (×2): 2 via ORAL
  Filled 2016-02-06 (×2): qty 2

## 2016-02-06 MED ORDER — METHOCARBAMOL 1000 MG/10ML IJ SOLN
500.0000 mg | Freq: Four times a day (QID) | INTRAVENOUS | Status: DC | PRN
  Filled 2016-02-06: qty 5

## 2016-02-06 MED ORDER — MAGNESIUM CITRATE PO SOLN
1.0000 | Freq: Once | ORAL | Status: DC | PRN
  Filled 2016-02-06: qty 296

## 2016-02-06 MED ORDER — PROPOFOL 10 MG/ML IV BOLUS
INTRAVENOUS | Status: DC | PRN
  Administered 2016-02-06: 100 mg via INTRAVENOUS

## 2016-02-06 MED ORDER — ACETAMINOPHEN 325 MG PO TABS: 650.0000 mg | ORAL_TABLET | Freq: Four times a day (QID) | ORAL | Status: DC | PRN

## 2016-02-06 MED ORDER — LACTATED RINGERS IV SOLN
INTRAVENOUS | Status: DC | PRN
  Administered 2016-02-06: 14:00:00 via INTRAVENOUS

## 2016-02-06 MED ORDER — POTASSIUM CHLORIDE IN NACL 20-0.45 MEQ/L-% IV SOLN
INTRAVENOUS | Status: DC
  Administered 2016-02-06 – 2016-02-08 (×4): via INTRAVENOUS
  Filled 2016-02-06 (×5): qty 1000

## 2016-02-06 MED ORDER — MORPHINE SULFATE (PF) 2 MG/ML IV SOLN
1.0000 mg | INTRAVENOUS | Status: DC | PRN
  Administered 2016-02-06: 1 mg via INTRAVENOUS
  Filled 2016-02-06: qty 1

## 2016-02-06 MED ORDER — ACETAMINOPHEN 650 MG RE SUPP
650.0000 mg | Freq: Four times a day (QID) | RECTAL | Status: DC | PRN
  Filled 2016-02-06: qty 1

## 2016-02-06 MED ORDER — DOCUSATE SODIUM 100 MG PO CAPS: 100.0000 mg | ORAL_CAPSULE | Freq: Two times a day (BID) | ORAL | Status: DC | PRN

## 2016-02-06 MED ORDER — ONDANSETRON HCL 4 MG/2ML IJ SOLN: 4.0000 mg | Freq: Once | INTRAMUSCULAR | Status: DC | PRN

## 2016-02-06 MED ORDER — DOCUSATE SODIUM 100 MG PO CAPS
100.0000 mg | ORAL_CAPSULE | Freq: Two times a day (BID) | ORAL | Status: DC
  Filled 2016-02-06: qty 1

## 2016-02-06 MED ORDER — FENTANYL CITRATE (PF) 100 MCG/2ML IJ SOLN: 25.0000 ug | INTRAMUSCULAR | Status: DC | PRN

## 2016-02-06 MED ORDER — NEOMYCIN-POLYMYXIN B GU 40-200000 IR SOLN
Status: AC
  Filled 2016-02-06: qty 4

## 2016-02-06 MED ORDER — METHOCARBAMOL 500 MG PO TABS: 500.0000 mg | ORAL_TABLET | Freq: Four times a day (QID) | ORAL | Status: DC | PRN

## 2016-02-06 MED ORDER — ONDANSETRON HCL 4 MG PO TABS: 4.0000 mg | ORAL_TABLET | Freq: Four times a day (QID) | ORAL | Status: DC | PRN

## 2016-02-06 MED ORDER — DEXAMETHASONE SODIUM PHOSPHATE 10 MG/ML IJ SOLN
INTRAMUSCULAR | Status: DC | PRN
  Administered 2016-02-06: 5 mg via INTRAVENOUS

## 2016-02-06 MED ORDER — NEOMYCIN-POLYMYXIN B GU 40-200000 IR SOLN
Status: DC | PRN
  Administered 2016-02-06: 4 mL

## 2016-02-06 MED ORDER — LIDOCAINE HCL (CARDIAC) 20 MG/ML IV SOLN
INTRAVENOUS | Status: DC | PRN
  Administered 2016-02-06: 30 mg via INTRAVENOUS

## 2016-02-06 MED ORDER — MIDAZOLAM HCL 2 MG/2ML IJ SOLN
INTRAMUSCULAR | Status: DC | PRN
  Administered 2016-02-06: 1 mg via INTRAVENOUS

## 2016-02-06 MED ORDER — ONDANSETRON HCL 4 MG/2ML IJ SOLN: 4.0000 mg | Freq: Four times a day (QID) | INTRAMUSCULAR | Status: DC | PRN

## 2016-02-06 MED ORDER — FENTANYL CITRATE (PF) 100 MCG/2ML IJ SOLN
INTRAMUSCULAR | Status: DC | PRN
  Administered 2016-02-06: 25 ug via INTRAVENOUS
  Administered 2016-02-06 (×3): 50 ug via INTRAVENOUS
  Administered 2016-02-06: 25 ug via INTRAVENOUS

## 2016-02-06 SURGICAL SUPPLY — 34 items
BANDAGE CONFORM 2X5YD N/S (GAUZE/BANDAGES/DRESSINGS) ×2 IMPLANT
BANDAGE ELASTIC 3 LF NS (GAUZE/BANDAGES/DRESSINGS) ×3 IMPLANT
BLADE SURG MINI STRL (BLADE) ×3 IMPLANT
BNDG CMPR MED 5X3 ELC HKLP NS (GAUZE/BANDAGES/DRESSINGS) ×1
BNDG ESMARK 4X12 TAN STRL LF (GAUZE/BANDAGES/DRESSINGS) ×3 IMPLANT
CANISTER SUCT 1200ML W/VALVE (MISCELLANEOUS) ×3 IMPLANT
CAST PADDING 3X4FT ST 30246 (SOFTGOODS) ×2
CHLORAPREP W/TINT 26ML (MISCELLANEOUS) ×6 IMPLANT
CUFF TOURN 18 STER (MISCELLANEOUS) ×2 IMPLANT
DRAIN PENROSE 1/4X12 LTX (DRAIN) ×2 IMPLANT
DRAPE SURG 17X11 SM STRL (DRAPES) ×3 IMPLANT
DRAPE U-SHAPE 47X51 STRL (DRAPES) ×3 IMPLANT
ELECT REM PT RETURN 9FT ADLT (ELECTROSURGICAL) ×3
ELECTRODE REM PT RTRN 9FT ADLT (ELECTROSURGICAL) ×1 IMPLANT
GAUZE PETRO XEROFOAM 1X8 (MISCELLANEOUS) ×3 IMPLANT
GAUZE SPONGE 4X4 12PLY STRL (GAUZE/BANDAGES/DRESSINGS) ×3 IMPLANT
GLOVE BIOGEL PI IND STRL 9 (GLOVE) ×1 IMPLANT
GLOVE BIOGEL PI INDICATOR 9 (GLOVE) ×2
GLOVE SURG 9.0 ORTHO LTXF (GLOVE) ×3 IMPLANT
GOWN STRL REUS TWL 2XL XL LVL4 (GOWN DISPOSABLE) ×3 IMPLANT
GOWN STRL REUS W/ TWL LRG LVL3 (GOWN DISPOSABLE) ×1 IMPLANT
GOWN STRL REUS W/TWL LRG LVL3 (GOWN DISPOSABLE) ×3
KIT RM TURNOVER STRD PROC AR (KITS) ×3 IMPLANT
NS IRRIG 500ML POUR BTL (IV SOLUTION) ×3 IMPLANT
PACK EXTREMITY ARMC (MISCELLANEOUS) ×3 IMPLANT
PAD CAST CTTN 3X4 STRL (SOFTGOODS) ×1 IMPLANT
PAD PREP 24X41 OB/GYN DISP (PERSONAL CARE ITEMS) ×3 IMPLANT
PADDING CAST COTTON 3X4 STRL (SOFTGOODS) ×1
SPLINT CAST 1 STEP 3X12 (MISCELLANEOUS) ×2 IMPLANT
STOCKINETTE 48X4 2 PLY STRL (GAUZE/BANDAGES/DRESSINGS) ×1 IMPLANT
STOCKINETTE STRL 4IN 9604848 (GAUZE/BANDAGES/DRESSINGS) ×3 IMPLANT
STRAP SAFETY BODY (MISCELLANEOUS) ×3 IMPLANT
SUT ETHILON 4 0 P 3 18 (SUTURE) ×3 IMPLANT
SWAB DUAL CULTURE TRANS RED ST (MISCELLANEOUS) ×2 IMPLANT

## 2016-02-06 NOTE — Progress Notes (Signed)
[facility] - [place] at [facility]   PATIENT NAME: [name]    MRN#:  [id number]  DATE OF BIRTH:  13-Jun-1932  SUBJECTIVE:  Hospital Day: 2 days [name] is a 82 y.o. female presenting with Wound Infection .   Overnight events: No acute overnight events Interval Events: Minimal pain in her right finger worse with activity, drainage still present  REVIEW OF SYSTEMS:  CONSTITUTIONAL: No fever, fatigue or weakness.  EYES: No blurred or double vision.  EARS, NOSE, AND THROAT: No tinnitus or ear pain.  RESPIRATORY: No cough, shortness of breath, wheezing or hemoptysis.  CARDIOVASCULAR: No chest pain, orthopnea, edema.  GASTROINTESTINAL: No nausea, vomiting, diarrhea or abdominal pain.  GENITOURINARY: No dysuria, hematuria.  ENDOCRINE: No polyuria, nocturia,  HEMATOLOGY: No anemia, easy bruising or bleeding SKIN: No rash or lesion. MUSCULOSKELETAL: No joint pain or arthritis.   NEUROLOGIC: No tingling, numbness, weakness.  PSYCHIATRY: No anxiety or depression.   DRUG ALLERGIES:   Allergies  Allergen Reactions  . Coumadin [Warfarin Sodium] Other (See Comments)    Makes pass out  . Adhesive [Tape] Rash  . Ivp Dye [Iodinated Diagnostic Agents] Other (See Comments)    Rash and turns purple Rash and turns purple  . Vytorin [Ezetimibe-Simvastatin] Other (See Comments)    leg cramps    VITALS:  Blood pressure (!) 154/74, pulse 64, temperature 98.1 F (36.7 C), temperature source Oral, resp. rate 20, height 5\' 4"  (1.626 m), weight 57.6 kg (126 lb 14.4 oz), SpO2 99 %.  PHYSICAL EXAMINATION:  VITAL SIGNS: Vitals:   02/05/16 2040 02/06/16 0509  BP: (!) 153/80 (!) 154/74  Pulse: 71 64  Resp: 18 20  Temp: 98.5 F (36.9 C) 98.1 F (36.7 C)   GENERAL:83 y.o.female currently in no acute distress.  HEAD: Normocephalic, atraumatic.  EYES: Pupils equal, round, reactive to light. Extraocular muscles intact. No scleral icterus.  MOUTH: Moist  mucosal membrane. Dentition intact. No abscess noted.  EAR, NOSE, THROAT: Clear without exudates. No external lesions.  NECK: Supple. No thyromegaly. No nodules. No JVD.  PULMONARY: Clear to ascultation, without wheeze rails or rhonci. No use of accessory muscles, Good respiratory effort. good air entry bilaterally CHEST: Nontender to palpation.  CARDIOVASCULAR: S1 and S2. Regular rate and rhythm. No murmurs, rubs, or gallops. No edema. Pedal pulses 2+ bilaterally.  GASTROINTESTINAL: Soft, nontender, nondistended. No masses. Positive bowel sounds. No hepatosplenomegaly.  MUSCULOSKELETAL: No swelling, clubbing, or edema. Range of motion full in all extremities.  NEUROLOGIC: Cranial nerves II through XII are intact. No gross focal neurological deficits. Sensation intact. Reflexes intact.  SKIN: Right middle finger with bloody drainage coming gross erythema as well as surrounding edema otherwise No ulceration, lesions, rashes, or cyanosis. Skin warm and dry. Turgor intact.  PSYCHIATRIC: Mood, affect within normal limits. The patient is awake, alert and oriented x 3. Insight, judgment intact.      LABORATORY PANEL:   CBC  Recent Labs Lab 02/05/16 0459  WBC 7.9  HGB 11.9*  HCT 33.1*  PLT 191   ------------------------------------------------------------------------------------------------------------------  Chemistries   Recent Labs Lab 02/04/16 1629 02/05/16 0459  NA 138 139  K 2.8* 3.3*  CL 100* 107  CO2 25 24  GLUCOSE 111* 112*  BUN 8 9  CREATININE 0.65 0.78  CALCIUM 9.1 8.2*  AST 28  --   ALT 15  --   ALKPHOS 123  --   BILITOT 0.8  --    ------------------------------------------------------------------------------------------------------------------  Cardiac Enzymes No  results for input(s): TROPONINI in the last 168 hours. ------------------------------------------------------------------------------------------------------------------  RADIOLOGY:  Dg Finger  Middle Right  Result Date: 02/04/2016 CLINICAL DATA:  Suspected infected finger with puss. EXAM: RIGHT MIDDLE FINGER 2+V COMPARISON:  RIGHT finger radiograph January 21, 2016 FINDINGS: Third digit soft tissue swelling, no subcutaneous gas or radiopaque foreign bodies. No fracture deformity or dislocation. No destructive bony lesions. Nonspecific calcification projecting in palm. IMPRESSION: Soft tissue swelling, no acute osseous process. Electronically Signed   By: Awilda Metro M.D.   On: 02/04/2016 19:03    EKG:   Orders placed or performed in visit on 07/05/15  . EKG 12-Lead    ASSESSMENT AND PLAN:   [name] is a 82 y.o. female presenting with Wound Infection . Admitted 02/04/2016 : Day #: 2 days 1. Cellulitis right middle finger: Continue current antibiotic regimen vancomycin/zosyn,for I&D today  2. Hypokalemia: Replace 4-5 3. Hypomagnesemia: Replace greater than 2 4. Lymphocytic colitis: Imodium   All the records are reviewed and case discussed with Care Management/Social Workerr. Management plans discussed with the patient, family and they are in agreement.  CODE STATUS: full TOTAL TIME TAKING CARE OF THIS PATIENT: 28 minutes.   POSSIBLE D/C IN 1DAYS, DEPENDING ON CLINICAL CONDITION.   [name],  Mardi Mainland.D on 02/06/2016 at 12:40 PM  Between 7am to 6pm - Pager - [phone]  After 6pm: House Pager: - [phone]  [facility]  Office  [phone]  CC: Primary care physician; Eustaquio Boyden, MD

## 2016-02-06 NOTE — Op Note (Signed)
02/04/2016 - 02/06/2016  4:07 PM  PATIENT:  Sherry BeechamElizabeth D Thomas    PRE-OPERATIVE DIAGNOSIS:  right middle finger wound infection   POST-OPERATIVE DIAGNOSIS:  Same  PROCEDURE:  INCISION AND DRAINAGE right middle finger wound infection  SURGEON:  Juanell FairlyKRASINSKI, Campbell Kray, MD  ANESTHESIA:   General  PREOPERATIVE INDICATIONS:  Sherry Beechamlizabeth D Fischl is a  82 y.o. female with a diagnosis of right middle finger wound initially treated in the ED who sutured her volar transverse laceration over the PIP joint.  Patient returned on 02/04/16 with redness, swelling and pain in the right middle finger.  She was admitted for IV antibiotic treatment but has not shown significant improvement.  The decision was made to perform an incision and drainage of the right middle finger.  I discussed the risks and benefits of surgery. The risks include but are not limited to infection, bleeding, nerve or blood vessel injury, joint stiffness or loss of motion, persistent pain, weakness or instability, and the need for further surgery. Medical risks include but are not limited to DVT and pulmonary embolism, myocardial infarction, stroke, pneumonia, respiratory failure and death. Patient understood these risks and wished to proceed.   OPERATIVE FINDINGS: Abscess over proximal phalanx of the middle finger with wound dehiscence of the transverse laceration volar middle finger.  OPERATIVE PROCEDURE: Patient was brought to the operating room. She was placed supine on the operative table. She underwent general anesthesia with an LMA. She had her right upper extremity prepped and draped in a sterile fashion. Timeout was performed to verify the patient's name, date of birth, medical record number, correct site of surgery correct procedure to be performed. All in attendance were in agreement the case began.  Patient was found to have wound dehiscence of the volar transverse laceration at the PIP joint. A hemostat was used to open up the  abscess. A culture swab was taken and sent to microbiology for Gram stain and culture. Patient was then given a dose of Zosyn in the OR. She had been on Zosyn and vancomycin prior to this operation.  A Bunnell incision was used to extend the laceration both proximally and distally. The tendon was examined and found to be intact. There did not appear to be any violation of the PIP joint.  The pulleys appear to be intact. The tenosynovium was debrided of. The wound was copiously irrigated with GU injected saline. Patient had an Angiocath used to flush the tenosynovium. The wound was debrided and irrigated from the DIP joint to the MP joint.  The Penrose drain was placed and 4-0 nylon was used to close the incision. A dry sterile dressing was applied over the middle finger. A bulky dressing was applied to the hand and wrist. A 3 x 12 splint was placed volarly to stabilize the hand and prevent digital motion which may disrupt the sutures. Patient was awoken and brought to the PACU in stable condition.  I saw the patient in PACU. She was awake and talking. She was having no significant pain. I also called her son on the telephone to let him know the case had been performed without complication and his mother was stable in the recovery room. Patient will resume her IV vancomycin and Zosyn. I'm going to order an infectious disease consult tomorrow. I'll remove the Penrose drain in the next 24-48 hours pending on her discharge.   Kathreen DevoidKevin L. Ahilyn Nell, MD

## 2016-02-06 NOTE — Transfer of Care (Signed)
Immediate Anesthesia Transfer of Care Note  Patient: [name]  Procedure(s) Performed: Procedure(s): INCISION AND DRAINAGE right middle finger (Right)  Patient Location: PACU  Anesthesia Type:General  Level of Consciousness: awake, alert , oriented and sedated  Airway & Oxygen Therapy: Patient Spontanous Breathing and Patient connected to face mask oxygen  Post-op Assessment: Report given to RN and Post -op Vital signs reviewed and stable  Post vital signs: Reviewed and stable  Last Vitals:  Vitals:   02/06/16 0509 02/06/16 1328  BP: (!) 154/74 (!) 160/81  Pulse: 64 78  Resp: 20   Temp: 36.7 C 36.9 C    Last Pain:  Vitals:   02/06/16 1328  TempSrc: Oral  PainSc:          Complications: No apparent anesthesia complications

## 2016-02-06 NOTE — Progress Notes (Signed)
  Subjective:  Patient was on Zosyn in addition to vancomycin yesterday. After 24 hours she is not showing significant response to the antibiotics. Patient reports pain as mild to moderate.  She denies any fevers chills or other systemic signs of infection.  Objective:   VITALS:   Vitals:   02/05/16 1400 02/05/16 2040 02/06/16 0500 02/06/16 0509  BP: (!) 151/69 (!) 153/80  (!) 154/74  Pulse: (!) 59 71  64  Resp:  18  20  Temp: 97.9 F (36.6 C) 98.5 F (36.9 C)  98.1 F (36.7 C)  TempSrc: Oral Oral  Oral  SpO2: 100% 99%  99%  Weight:   57.6 kg (126 lb 14.4 oz)   Height:        PHYSICAL EXAM:  Right middle finger: Patient continues to have significant erythema localized over the proximal phalanx volarly. Patient's edema has improved.  Her range of motion is still limited due to pain. Patient has a small amount of purulent drainage. She has decreased sensation light touch in the right middle finger.  LABS  Results for orders placed or performed during the hospital encounter of 02/04/16 (from the past 24 hour(s))  Glucose, capillary     Status: None   Collection Time: 02/06/16  7:31 AM  Result Value Ref Range   Glucose-Capillary 95 65 - 99 mg/dL    Dg Finger Middle Right  Result Date: 02/04/2016 CLINICAL DATA:  Suspected infected finger with puss. EXAM: RIGHT MIDDLE FINGER 2+V COMPARISON:  RIGHT finger radiograph January 21, 2016 FINDINGS: Third digit soft tissue swelling, no subcutaneous gas or radiopaque foreign bodies. No fracture deformity or dislocation. No destructive bony lesions. Nonspecific calcification projecting in palm. IMPRESSION: Soft tissue swelling, no acute osseous process. Electronically Signed   By: Awilda Metroourtnay  Bloomer M.D.   On: 02/04/2016 19:03    Assessment/Plan: Day of Surgery   Active Problems:   Cellulitis  Patient's response to antibiotics as been limited. She has been nothing by mouth after midnight. I'm going to take her to the operating room for  incision and drainage of the volar surface of the right proximal phalanx, middle finger. I discussed the procedure with the patient along with a potential risk. She is in agreement with this plan.    Juanell FairlyKRASINSKI, Shiree Altemus , MD 02/06/2016, 9:54 AM

## 2016-02-06 NOTE — Progress Notes (Signed)
  Subjective:  POST-OP CHECK:  Patient reports pain as mild.  No other complaints.   Objective:   VITALS:   Vitals:   02/06/16 1650 02/06/16 1703 02/06/16 1733 02/06/16 1830  BP:   (!) 173/83 (!) 153/80  Pulse:  75 73 88  Resp:  20 20 20   Temp:    97.9 F (36.6 C)  TempSrc:    Oral  SpO2: 93% 93% 95% 96%  Weight:      Height:        PHYSICAL EXAM:  Right hand:  Bandages and splint are C/D/I.  Middle finger is perfused.    LABS  Results for orders placed or performed during the hospital encounter of 02/04/16 (from the past 24 hour(s))  Glucose, capillary     Status: None   Collection Time: 02/06/16  7:31 AM  Result Value Ref Range   Glucose-Capillary 95 65 - 99 mg/dL  Aerobic/Anaerobic Culture (surgical/deep wound)     Status: None (Preliminary result)   Collection Time: 02/06/16  2:35 PM  Result Value Ref Range   Specimen Description      FINGER RIGHT MIDDLE ABSCESS [facility]    Special Requests NONE    Gram Stain PENDING    Culture PENDING    Report Status PENDING     No results found.  Assessment/Plan: Day of Surgery   Active Problems:   Cellulitis  Continue elevation.  Will follow up on culture from the OR.  Continue zosyn and vancomycin.  ID consult ordered.      Juanell FairlyKRASINSKI, Joseguadalupe Stan , MD 02/06/2016, 7:36 PM

## 2016-02-06 NOTE — Progress Notes (Signed)
Patient has been kept NPO post midnight, for possible incision and debridement of right middle finger on 02/06/16. Patient verbalized compliance and understanding to instruction. Kept safe and comfortable.

## 2016-02-06 NOTE — Progress Notes (Signed)
Post op: VSS x3. Right hand and forearm elevated on pillow with ice pack applied. Morphine effect for pain control. Tolerating clear liquid diet in small amounts.. Dsg dry/intact on right finger/hand/arm. IVF's infusing. Pt has not voided since surgery.  Stable post op at this time.

## 2016-02-06 NOTE — Anesthesia Preprocedure Evaluation (Signed)
Anesthesia Evaluation  Patient identified by MRN, date of birth, ID band Patient awake    Reviewed: Allergy & Precautions, NPO status , Patient's Chart, lab work & pertinent test results, reviewed documented beta blocker date and time   Airway Mallampati: III  TM Distance: <3 FB     Dental  (+) Chipped   Pulmonary neg pulmonary ROS,    Pulmonary exam normal        Cardiovascular hypertension, Pt. on medications and Pt. on home beta blockers + CAD, + Peripheral Vascular Disease and +CHF  Normal cardiovascular exam+ dysrhythmias Atrial Fibrillation      Neuro/Psych Anxiety Depression Peripheral neuropathy  Neuromuscular disease    GI/Hepatic negative GI ROS, Neg liver ROS,   Endo/Other  Hypothyroidism   Renal/GU negative Renal ROS  negative genitourinary   Musculoskeletal  (+) Arthritis , Osteoarthritis,    Abdominal Normal abdominal exam  (+)   Peds negative pediatric ROS (+)  Hematology   Anesthesia Other Findings   Reproductive/Obstetrics                             Anesthesia Physical Anesthesia Plan  ASA: III and emergent  Anesthesia Plan: General   Post-op Pain Management:    Induction: Intravenous  Airway Management Planned: LMA  Additional Equipment:   Intra-op Plan:   Post-operative Plan: Extubation in OR  Informed Consent: I have reviewed the patients History and Physical, chart, labs and discussed the procedure including the risks, benefits and alternatives for the proposed anesthesia with the patient or authorized representative who has indicated his/her understanding and acceptance.   Dental advisory given  Plan Discussed with: CRNA and Surgeon  Anesthesia Plan Comments:         Anesthesia Quick Evaluation

## 2016-02-06 NOTE — Anesthesia Postprocedure Evaluation (Signed)
Anesthesia Post Note  Patient: Aram Beechamlizabeth D Sherburn  Procedure(s) Performed: Procedure(s) (LRB): INCISION AND DRAINAGE right middle finger (Right)  Patient location during evaluation: PACU Anesthesia Type: General Level of consciousness: awake and alert and oriented Pain management: pain level controlled Vital Signs Assessment: post-procedure vital signs reviewed and stable Respiratory status: spontaneous breathing Cardiovascular status: blood pressure returned to baseline Anesthetic complications: no    Last Vitals:  Vitals:   02/06/16 1703 02/06/16 1733  BP:  (!) 173/83  Pulse: 75 73  Resp: 20 20  Temp:      Last Pain:  Vitals:   02/06/16 1730  TempSrc:   PainSc: 6                  Jenice Leiner

## 2016-02-06 NOTE — Care Management Important Message (Signed)
Important Message  Patient Details  Name: Aram Beechamlizabeth D Rohrbach MRN: [id number] Date of Birth: 01/26/1933   Medicare Important Message Given:  Yes    Billiejo Sorto A, RN 02/06/2016, 11:03 AM

## 2016-02-06 NOTE — Progress Notes (Addendum)
Pt has been NPO since 12 MN; preop [facility]. Advised to give metoprolol now with sip of water-done. Consent to be done in pre OR area with MD aware. No jewelry, gown on, CHG bath done last night, declined need to go to BR again. States she is ready to go. VSS. Transporter arrived ready to transport pt to OR.

## 2016-02-06 NOTE — Anesthesia Procedure Notes (Signed)
Procedure Name: LMA Insertion Performed by: Tonia GhentOOK-MARTIN, Dontavia Brand Pre-anesthesia Checklist: Patient identified, Emergency Drugs available, Suction available, Patient being monitored and Timeout performed Patient Re-evaluated:Patient Re-evaluated prior to inductionOxygen Delivery Method: Circle system utilized Preoxygenation: Pre-oxygenation with 100% oxygen Intubation Type: IV induction Ventilation: Mask ventilation without difficulty LMA Size: 3.0 Number of attempts: 1 Placement Confirmation: breath sounds checked- equal and bilateral,  CO2 detector and positive ETCO2

## 2016-02-07 ENCOUNTER — Encounter: Payer: Self-pay | Admitting: Orthopedic Surgery

## 2016-02-07 LAB — C-REACTIVE PROTEIN: CRP: 6.3 mg/dL — ABNORMAL HIGH (ref <1.0)

## 2016-02-07 LAB — SEDIMENTATION RATE: SED RATE: 52 mm/h — AB (ref 0–30)

## 2016-02-07 MED ORDER — ATORVASTATIN CALCIUM 20 MG PO TABS
40.0000 mg | ORAL_TABLET | Freq: Every day | ORAL | Status: DC
  Administered 2016-02-07: 18:00:00 40 mg via ORAL
  Filled 2016-02-07: qty 2

## 2016-02-07 MED ORDER — ENOXAPARIN SODIUM 40 MG/0.4ML ~~LOC~~ SOLN
40.0000 mg | SUBCUTANEOUS | Status: DC
  Administered 2016-02-07 – 2016-02-08 (×2): 40 mg via SUBCUTANEOUS
  Filled 2016-02-07 (×2): qty 0.4

## 2016-02-07 NOTE — Progress Notes (Signed)
Pharmacy Antibiotic Note  [name] is a 82 y.o. female admitted on 02/04/2016 with cellulitis of finger with pus, pain and redness.  Pharmacy has been consulted for vancomycin dosing. Patient is also receiving Zosyn.   Plan: Will continue vancomycin 750 mg iv q 24 hours with trough scheduled for 11/7. F/U culture results from wound cx.     Height: 5\' 4"  (162.6 cm) Weight: 126 lb 14.4 oz (57.6 kg) IBW/kg (Calculated) : 54.7  Temp (24hrs), Avg:98.1 F (36.7 C), Min:97.4 F (36.3 C), Max:99.2 F (37.3 C)   Recent Labs Lab 02/04/16 1629 02/05/16 0459  WBC 9.7 7.9  CREATININE 0.65 0.78    Estimated Creatinine Clearance: 47.6 mL/min (by C-G formula based on SCr of 0.78 mg/dL).    Allergies  Allergen Reactions  . Coumadin [Warfarin Sodium] Other (See Comments)    Makes pass out  . Adhesive [Tape] Rash  . Ivp Dye [Iodinated Diagnostic Agents] Other (See Comments)    Rash and turns purple Rash and turns purple  . Vytorin [Ezetimibe-Simvastatin] Other (See Comments)    leg cramps    Antimicrobials this admission: Vanc 11/3 >>    Dose adjustments this admission:   Microbiology results: 11/03 BCx: NGTD x 3 11/5 Wound cx: GPC pairs and chains, GN coccobacilli  Thank you for allowing pharmacy to be a part of this patient's care.  Luisa HartScott Omer Puccinelli, PharmD Clinical Pharmacist  02/07/2016 10:28 AM

## 2016-02-07 NOTE — Progress Notes (Signed)
  Subjective:  POD #1 s/p I&D right middle finger.  Patient reports pain as mild.  Appreciate Dr. Sampson GoonFitzgerald consultation.  Objective:   VITALS:   Vitals:   02/06/16 2341 02/07/16 0441 02/07/16 0816 02/07/16 1505  BP: (!) 134/58 (!) 146/60 (!) 143/74 (!) 157/80  Pulse: (!) 57 (!) 57 68 68  Resp: 20 20 18 16   Temp: 97.8 F (36.6 C) 97.5 F (36.4 C) 98.4 F (36.9 C) 97.6 F (36.4 C)  TempSrc: Oral Oral Oral   SpO2: 99% 99% 100% 100%  Weight:      Height:        PHYSICAL EXAM:  Right hand: Patient's dressing is clean dry and intact. Volar splint remains in place. I kept the dressings on today. Fingertips are well-perfused.    LABS  Results for orders placed or performed during the hospital encounter of 02/04/16 (from the past 24 hour(s))  Sedimentation rate     Status: Abnormal   Collection Time: 02/07/16  3:52 PM  Result Value Ref Range   Sed Rate 52 (H) 0 - 30 mm/hr    No results found.  Assessment/Plan: 1 Day Post-Op   Active Problems:   Cellulitis  Awaiting final cultures from the OR. Continue Zosyn and vancomycin. Dr. Sampson GoonFitzgerald is awaiting final cultures to make further antibiotic recommendations. Patient will continue to elevate the right hand. I will change her dressing at the bedside tomorrow morning.    Juanell FairlyKRASINSKI, Ashlay Altieri , MD 02/07/2016, 8:30 PM

## 2016-02-07 NOTE — Progress Notes (Signed)
[facility] [place] [facility]   PATIENT NAME: [name]    MRN#:  [id number]  DATE OF BIRTH:  07/01/1932  SUBJECTIVE:  Hospital Day: 3 days [name] is a 82 y.o. female presenting with Wound Infection .   Overnight events: OR yesterday Interval Events: Minimal pain in her right finger   REVIEW OF SYSTEMS:  CONSTITUTIONAL: No fever, fatigue or weakness.  EYES: No blurred or double vision.  EARS, NOSE, AND THROAT: No tinnitus or ear pain.  RESPIRATORY: No cough, shortness of breath, wheezing or hemoptysis.  CARDIOVASCULAR: No chest pain, orthopnea, edema.  GASTROINTESTINAL: No nausea, vomiting, diarrhea or abdominal pain.  GENITOURINARY: No dysuria, hematuria.  ENDOCRINE: No polyuria, nocturia,  HEMATOLOGY: No anemia, easy bruising or bleeding SKIN: No rash or lesion. MUSCULOSKELETAL: No joint pain or arthritis.   NEUROLOGIC: No tingling, numbness, weakness.  PSYCHIATRY: No anxiety or depression.   DRUG ALLERGIES:   Allergies  Allergen Reactions  . Coumadin [Warfarin Sodium] Other (See Comments)    Makes pass out  . Adhesive [Tape] Rash  . Ivp Dye [Iodinated Diagnostic Agents] Other (See Comments)    Rash and turns purple Rash and turns purple  . Vytorin [Ezetimibe-Simvastatin] Other (See Comments)    leg cramps    VITALS:  Blood pressure (!) 143/74, pulse 68, temperature 98.4 F (36.9 C), temperature source Oral, resp. rate 18, height 5\' 4"  (1.626 m), weight 57.6 kg (126 lb 14.4 oz), SpO2 100 %.  PHYSICAL EXAMINATION:  VITAL SIGNS: Vitals:   02/07/16 0441 02/07/16 0816  BP: (!) 146/60 (!) 143/74  Pulse: (!) 57 68  Resp: 20 18  Temp: 97.5 F (36.4 C) 98.4 F (36.9 C)   GENERAL:83 y.o.female currently in no acute distress.  HEAD: Normocephalic, atraumatic.  EYES: Pupils equal, round, reactive to light. Extraocular muscles intact. No scleral icterus.  MOUTH: Moist mucosal membrane. Dentition intact. No abscess  noted.  EAR, NOSE, THROAT: Clear without exudates. No external lesions.  NECK: Supple. No thyromegaly. No nodules. No JVD.  PULMONARY: Clear to ascultation, without wheeze rails or rhonci. No use of accessory muscles, Good respiratory effort. good air entry bilaterally CHEST: Nontender to palpation.  CARDIOVASCULAR: S1 and S2. Regular rate and rhythm. No murmurs, rubs, or gallops. No edema. Pedal pulses 2+ bilaterally.  GASTROINTESTINAL: Soft, nontender, nondistended. No masses. Positive bowel sounds. No hepatosplenomegaly.  MUSCULOSKELETAL: No swelling, clubbing, or edema. Range of motion full in all extremities.  NEUROLOGIC: Cranial nerves II through XII are intact. No gross focal neurological deficits. Sensation intact. Reflexes intact.  SKIN: Right middle finger with bloody drainage coming gross erythema as well as surrounding edema otherwise No ulceration, lesions, rashes, or cyanosis. Skin warm and dry. Turgor intact.  PSYCHIATRIC: Mood, affect within normal limits. The patient is awake, alert and oriented x 3. Insight, judgment intact.      LABORATORY PANEL:   CBC  Recent Labs Lab 02/05/16 0459  WBC 7.9  HGB 11.9*  HCT 33.1*  PLT 191   ------------------------------------------------------------------------------------------------------------------  Chemistries   Recent Labs Lab 02/04/16 1629 02/05/16 0459  NA 138 139  K 2.8* 3.3*  CL 100* 107  CO2 25 24  GLUCOSE 111* 112*  BUN 8 9  CREATININE 0.65 0.78  CALCIUM 9.1 8.2*  AST 28  --   ALT 15  --   ALKPHOS 123  --   BILITOT 0.8  --    ------------------------------------------------------------------------------------------------------------------  Cardiac Enzymes No results for input(s): TROPONINI in the  last 168 hours. ------------------------------------------------------------------------------------------------------------------  RADIOLOGY:  No results found.  EKG:   Orders placed or performed in  visit on 07/05/15  . EKG 12-Lead    ASSESSMENT AND PLAN:   [name] is a 82 y.o. female presenting with Wound Infection . Admitted 02/04/2016 : Day #: 3 days 1. Cellulitis right middle finger: Continue current antibiotic regimen vancomycin/zosyn, OR yesterday, follow cultures, ID pending  2. Hypokalemia: Replace 4-5 3. Hypomagnesemia: Replace greater than 2 4. Lymphocytic colitis: Imodium   All the records are reviewed and case discussed with Care Management/Social Workerr. Management plans discussed with the patient, family and they are in agreement.  CODE STATUS: full TOTAL TIME TAKING CARE OF THIS PATIENT: 28 minutes.   POSSIBLE D/C IN 1DAYS, DEPENDING ON CLINICAL CONDITION.   Hower,  Mardi MainlandDavid K M.D on 02/07/2016 at 12:20 PM  Between 7am to 6pm - Pager - [phone]  After 6pm: House Pager: - [phone]  [facility]  Office  [phone]  CC: Primary care physician; Eustaquio BoydenJavier Gutierrez, MD

## 2016-02-07 NOTE — Progress Notes (Signed)
Chaplain was making his rounds and visited with pt in room 119. Provided the ministry of prayer and emotional support.    02/07/16 1350  Clinical Encounter Type  Visited With Patient;Patient and family together  Visit Type Initial;Spiritual support  Referral From Nurse  Spiritual Encounters  Spiritual Needs Prayer;Emotional

## 2016-02-07 NOTE — Consult Note (Signed)
[facility] Infectious Disease     Reason for Consult: Hand abscess Referring Physician: Lavetta Nielsen, D Date of Admission:  02/04/2016   Active Problems:   Cellulitis   HPI: [name] is a 82 y.o. female admitted with R hand infection after cutting her hand on a fan blade. She initially had the incision washed and sutured in ED 10/28 but then developed worsneing middle finger swelling and pain and hand swelling with spreading redness up arm. She was started on IV abx on admit but site worsened so underwent I and D by Dr Cindi Carbon on 1/5.  No involvement of the joint was noted.  Currently still with pain but improved. WBC on admit 9.7, afebrile.  Past Medical History:  Diagnosis Date  . Arthritis   . Atherosclerosis of abdominal aorta ([place])    by xray  . Atrial fibrillation ([place])   . CAD (coronary artery disease)    95% LAD stenosis, [place] x 02 September 1996  . Cervicalgia   . CHF (congestive heart failure) ([place]) 2014   on recent hospitalization  . Chronic interstitial lung disease ([place]) 08/2015   by CXR  . Depression with anxiety    celexa started 04/2010  . Dyslipidemia   . Facial trauma 06/01/2014   Fall 05/2014 without fracture by facial/head CT   . Gout    prior PCP stopped allopurinol  . Hyperlipidemia   . Hypertension   . LBP (low back pain) 2005   spinal stenosis and bulging discs at L2-L5 s/p lumbar laminectomy Trenton Gammon)  . Lymphocytic colitis 10/28/2013  . Osteopenia 07/2013   T score -2.2 L femur  . Peripheral neuropathy ([place]) since feb 2015   right arm   . Prediabetes 2001  . Syncopal episodes 2015   orthostatic, normal EEG and neuro eval ([place])  . T12 compression fracture ([place]) 2015   by CT   Past Surgical History:  Procedure Laterality Date  . CARDIAC CATHETERIZATION    . CATARACT EXTRACTION Bilateral   . COLONOSCOPY WITH PROPOFOL N/A 10/16/2013   Milus Banister, MD  . DEXA  07/2013   T score -2.2 L femur  . INCISION AND DRAINAGE Right 02/06/2016    Procedure: INCISION AND DRAINAGE right middle finger;  Surgeon: Thornton Park, MD;  Location: [facility] ORS;  Service: Orthopedics;  Laterality: Right;  . LUMBAR LAMINECTOMY  2005   Dr. Trenton Gammon  . PERCUTANEOUS CORONARY STENT INTERVENTION (PCI-S)     stent x3 in heart  . TONSILLECTOMY    . TUBAL LIGATION     Social History  Substance Use Topics  . Smoking status: Never Smoker  . Smokeless tobacco: Never Used  . Alcohol use Yes     Comment: Occasionally drinks beer   Family History  Problem Relation Age of Onset  . Prostate cancer Father   . Breast cancer Sister 34  . Heart disease Brother   . Heart disease Other     First degree relatives with heart disease but later onset  . Heart disease Sister     pacer  . Esophageal cancer Neg Hx   . Colon cancer Neg Hx     Allergies:  Allergies  Allergen Reactions  . Coumadin [Warfarin Sodium] Other (See Comments)    Makes pass out  . Adhesive [Tape] Rash  . Ivp Dye [Iodinated Diagnostic Agents] Other (See Comments)    Rash and turns purple Rash and turns purple  . Vytorin [Ezetimibe-Simvastatin] Other (See Comments)    leg  cramps    Current antibiotics: Antibiotics Given (last 72 hours)    Date/Time Action Medication Dose Rate   02/05/16 0752 Given   vancomycin (VANCOCIN) IVPB 750 mg/150 ml premix 750 mg 150 mL/hr   02/05/16 1353 Given   piperacillin-tazobactam (ZOSYN) IVPB 3.375 g 3.375 g 12.5 mL/hr   02/05/16 2033 Given   piperacillin-tazobactam (ZOSYN) IVPB 3.375 g 3.375 g 12.5 mL/hr   02/06/16 0516 Given   piperacillin-tazobactam (ZOSYN) IVPB 3.375 g 3.375 g 12.5 mL/hr   02/06/16 5320 Given  [Zosyn was infusing]   vancomycin (VANCOCIN) IVPB 750 mg/150 ml premix 750 mg 150 mL/hr   02/06/16 1439 Given   piperacillin-tazobactam (ZOSYN) IVPB 3.375 g 3.375 g    02/06/16 2100 Given   piperacillin-tazobactam (ZOSYN) IVPB 3.375 g 3.375 g 12.5 mL/hr   02/07/16 0528 Given   piperacillin-tazobactam (ZOSYN) IVPB 3.375 g 3.375 g  12.5 mL/hr   02/07/16 0800 Given   vancomycin (VANCOCIN) IVPB 750 mg/150 ml premix 750 mg 150 mL/hr      MEDICATIONS: . atorvastatin  40 mg Oral q1800  . digoxin  0.125 mg Oral Daily  . enoxaparin (LOVENOX) injection  40 mg Subcutaneous Q24H  . FLUoxetine  20 mg Oral Daily  . lisinopril  5 mg Oral Daily  . metoprolol tartrate  25 mg Oral BID  . piperacillin-tazobactam (ZOSYN)  IV  3.375 g Intravenous Q8H  . vancomycin  750 mg Intravenous Q24H    Review of Systems - 11 systems reviewed and negative per HPI   OBJECTIVE: Temp:  [97.4 F (36.3 C)-98.4 F (36.9 C)] 97.6 F (36.4 C) (11/06 1505) Pulse Rate:  [57-88] 68 (11/06 1505) Resp:  [16-20] 16 (11/06 1505) BP: (134-162)/(58-80) 157/80 (11/06 1505) SpO2:  [94 %-100 %] 100 % (11/06 1505) Physical Exam  Constitutional:  oriented to person, place, and time, pleasant, thin HENT: [place]/AT, PERRLA, no scleral icterus Mouth/Throat: Oropharynx is clear and moist. No oropharyngeal exudate.  Cardiovascular: Normal rate, \ Pulmonary/Chest: Effort normal and breath sounds normal.  Abdominal: Soft.  Lymphadenopathy: no cervical adenopathy. No axillary adenopathy Neurological: alert and oriented to person, place, and time.  Skin: R middle finger is wrapped post op. Forearm with no edema or redness Psychiatric: a normal mood and affect.  behavior is normal.    LABS: Results for orders placed or performed during the hospital encounter of 02/04/16 (from the past 48 hour(s))  Glucose, capillary     Status: None   Collection Time: 02/06/16  7:31 AM  Result Value Ref Range   Glucose-Capillary 95 65 - 99 mg/dL  Aerobic/Anaerobic Culture (surgical/deep wound)     Status: None (Preliminary result)   Collection Time: 02/06/16  2:35 PM  Result Value Ref Range   Specimen Description FINGER RIGHT MIDDLE ABSCESS    Special Requests NONE    Gram Stain      ABUNDANT WBC PRESENT, PREDOMINANTLY PMN RARE GRAM POSITIVE COCCI IN PAIRS AND  CHAINS RARE GRAM NEGATIVE COCCOBACILLI    Culture      CULTURE REINCUBATED FOR BETTER GROWTH Performed at [facility]    Report Status PENDING   Sedimentation rate     Status: Abnormal   Collection Time: 02/07/16  3:52 PM  Result Value Ref Range   Sed Rate 52 (H) 0 - 30 mm/hr   No components found for: ESR, C REACTIVE PROTEIN MICRO: Recent Results (from the past 720 hour(s))  Culture, blood (Routine X 2)     Status: None (Preliminary  result)   Collection Time: 02/04/16  4:29 PM  Result Value Ref Range Status   Specimen Description BLOOD LEFT WRIST  Final   Special Requests BOTTLES DRAWN AEROBIC AND ANAEROBIC  [place]  Final   Culture NO GROWTH 3 DAYS  Final   Report Status PENDING  Incomplete  Culture, blood (Routine X 2)     Status: None (Preliminary result)   Collection Time: 02/04/16  4:29 PM  Result Value Ref Range Status   Specimen Description BLOOD RIGHT FOREARM  Final   Special Requests BOTTLES DRAWN AEROBIC AND ANAEROBIC  6CC  Final   Culture NO GROWTH 3 DAYS  Final   Report Status PENDING  Incomplete  Aerobic/Anaerobic Culture (surgical/deep wound)     Status: None (Preliminary result)   Collection Time: 02/06/16  2:35 PM  Result Value Ref Range Status   Specimen Description FINGER RIGHT MIDDLE ABSCESS  Final   Special Requests NONE  Final   Gram Stain   Final    ABUNDANT WBC PRESENT, PREDOMINANTLY PMN RARE GRAM POSITIVE COCCI IN PAIRS AND CHAINS RARE GRAM NEGATIVE COCCOBACILLI    Culture   Final    CULTURE REINCUBATED FOR BETTER GROWTH Performed at [facility]    Report Status PENDING  Incomplete    IMAGING: Dg Finger Middle Right  Result Date: 02/04/2016 CLINICAL DATA:  Suspected infected finger with puss. EXAM: RIGHT MIDDLE FINGER 2+V COMPARISON:  RIGHT finger radiograph January 21, 2016 FINDINGS: Third digit soft tissue swelling, no subcutaneous gas or radiopaque foreign bodies. No fracture deformity or dislocation. No destructive bony  lesions. Nonspecific calcification projecting in palm. IMPRESSION: Soft tissue swelling, no acute osseous process. Electronically Signed   By: Elon Alas M.D.   On: 02/04/2016 19:03   Dg Finger Middle Right  Result Date: 01/29/2016 CLINICAL DATA:  Reduction EXAM: RIGHT MIDDLE FINGER 2+V COMPARISON:  Earlier today FINDINGS: The PIP joint of the long finger is now anatomically aligned. No acute fracture. IMPRESSION: Anatomic reduction of the PIP joint. Electronically Signed   By: Marybelle Killings M.D.   On: 01/29/2016 14:44   Dg Finger Middle Right  Result Date: 01/29/2016 CLINICAL DATA:  Status post reduction of a right third PIP joint dislocation. EXAM: RIGHT MIDDLE FINGER 2+V COMPARISON:  Earlier today. FINDINGS: The previously demonstrated dislocation at the right third PIP joint is again demonstrated. This consists of dorsal dislocation of the third middle phalanx relative to the proximal phalanx. There is also mild ulnar subluxation of the middle phalanx relative to the proximal phalanx. No fracture is seen. IMPRESSION: Stable dislocation at the third PIP joint. Electronically Signed   By: Claudie Revering M.D.   On: 01/29/2016 14:19   Dg Finger Middle Right  Result Date: 01/29/2016 CLINICAL DATA:  Fall last night EXAM: RIGHT MIDDLE FINGER 2+V COMPARISON:  None. FINDINGS: Dorsal dislocation of the third PIP joint. No fracture identified. Soft tissue swelling. IMPRESSION: Dorsal dislocation of the third PIP joint Electronically Signed   By: Franchot Gallo M.D.   On: 01/29/2016 13:01    Assessment:   [name] is a 82 y.o. female with R middle finger abscess, s/p I and D 11/5 following laceration on a fan.  Gram stain with GPC in chains and GN coccobacilli. Final culture pending. Clinically improving, no evidence bone or joint involvement. Not immunocompromised, no DM.  I suspect she will respond nicely to oral abx for a 10-14 day course.   Will check esr crp.  Would continue  current  abx as inpatient but if improving and ok with ortho she could be discharged tomorrow on empiric antibiotics if the culture is not back by then. If no culture result tomorrow would dc on augmentin and doxy. I can see in follow up next week as long as she has fu with ortho later this week.  Thank you very much for allowing me to participate in the care of this patient. Please call with questions.   Cheral Marker. Ola Spurr, MD

## 2016-02-08 ENCOUNTER — Other Ambulatory Visit: Payer: Self-pay | Admitting: Family Medicine

## 2016-02-08 ENCOUNTER — Ambulatory Visit: Payer: PPO | Admitting: Family Medicine

## 2016-02-08 LAB — CBC
HEMATOCRIT: 31.9 % — AB (ref 35.0–47.0)
HEMOGLOBIN: 11.1 g/dL — AB (ref 12.0–16.0)
MCH: 34.7 pg — ABNORMAL HIGH (ref 26.0–34.0)
MCHC: 34.8 g/dL (ref 32.0–36.0)
MCV: 99.8 fL (ref 80.0–100.0)
Platelets: 213 10*3/uL (ref 150–440)
RBC: 3.2 MIL/uL — ABNORMAL LOW (ref 3.80–5.20)
RDW: 12.5 % (ref 11.5–14.5)
WBC: 8.7 10*3/uL (ref 3.6–11.0)

## 2016-02-08 LAB — BASIC METABOLIC PANEL
ANION GAP: 8 (ref 5–15)
BUN: 10 mg/dL (ref 6–20)
CHLORIDE: 106 mmol/L (ref 101–111)
CO2: 25 mmol/L (ref 22–32)
Calcium: 8.5 mg/dL — ABNORMAL LOW (ref 8.9–10.3)
Creatinine, Ser: 0.8 mg/dL (ref 0.44–1.00)
GFR calc non Af Amer: >60 mL/min (ref >60)
GLUCOSE: 111 mg/dL — AB (ref 65–99)
POTASSIUM: 3.7 mmol/L (ref 3.5–5.1)
Sodium: 139 mmol/L (ref 135–145)

## 2016-02-08 MED ORDER — HYDROCODONE-ACETAMINOPHEN 5-325 MG PO TABS: 1.0000 | ORAL_TABLET | ORAL | 0 refills | Status: DC | PRN

## 2016-02-08 MED ORDER — OXYCODONE HCL 5 MG PO TABS: 5.0000 mg | ORAL_TABLET | ORAL | 0 refills | Status: DC | PRN

## 2016-02-08 MED ORDER — DOXYCYCLINE HYCLATE 50 MG PO CAPS: 100.0000 mg | ORAL_CAPSULE | Freq: Two times a day (BID) | ORAL | 0 refills | Status: AC

## 2016-02-08 MED ORDER — AMOXICILLIN-POT CLAVULANATE 875-125 MG PO TABS: 1.0000 | ORAL_TABLET | Freq: Two times a day (BID) | ORAL | 0 refills | Status: AC

## 2016-02-08 NOTE — Progress Notes (Signed)
[facility] Date of Admission:  02/04/2016     ID: Sherry Beechamlizabeth D Thomas is a 82 y.o. female with  Finger celluliits and abscess Active Problems:   Cellulitis   Subjective: Feels better, less swelling pain. No fevers  ROS  Eleven systems are reviewed and negative except per hpi  Medications:  Antibiotics Given (last 72 hours)    Date/Time Action Medication Dose Rate   02/05/16 2033 Given   piperacillin-tazobactam (ZOSYN) IVPB 3.375 g 3.375 g 12.5 mL/hr   02/06/16 0516 Given   piperacillin-tazobactam (ZOSYN) IVPB 3.375 g 3.375 g 12.5 mL/hr   02/06/16 [id number] Given  [Zosyn was infusing]   vancomycin (VANCOCIN) IVPB 750 mg/150 ml premix 750 mg 150 mL/hr   02/06/16 1439 Given   piperacillin-tazobactam (ZOSYN) IVPB 3.375 g 3.375 g    02/06/16 2100 Given   piperacillin-tazobactam (ZOSYN) IVPB 3.375 g 3.375 g 12.5 mL/hr   02/07/16 0528 Given   piperacillin-tazobactam (ZOSYN) IVPB 3.375 g 3.375 g 12.5 mL/hr   02/07/16 0800 Given   vancomycin (VANCOCIN) IVPB 750 mg/150 ml premix 750 mg 150 mL/hr   02/07/16 1807 Given   piperacillin-tazobactam (ZOSYN) IVPB 3.375 g 3.375 g 12.5 mL/hr   02/08/16 [id number] Given   piperacillin-tazobactam (ZOSYN) IVPB 3.375 g 3.375 g 12.5 mL/hr   02/08/16 0745 Given   vancomycin (VANCOCIN) IVPB 750 mg/150 ml premix 750 mg 150 mL/hr   02/08/16 0925 Given   piperacillin-tazobactam (ZOSYN) IVPB 3.375 g 3.375 g 12.5 mL/hr     . atorvastatin  40 mg Oral q1800  . digoxin  0.125 mg Oral Daily  . enoxaparin (LOVENOX) injection  40 mg Subcutaneous Q24H  . FLUoxetine  20 mg Oral Daily  . lisinopril  5 mg Oral Daily  . metoprolol tartrate  25 mg Oral BID  . piperacillin-tazobactam (ZOSYN)  IV  3.375 g Intravenous Q8H  . vancomycin  750 mg Intravenous Q24H    Objective: Vital signs in last 24 hours: Temp:  [97.5 F (36.4 C)-98.4 F (36.9 C)] 97.8 F (36.6 C) (11/07 1217) Pulse Rate:  [53-70] 66 (11/07 1217) Resp:  [16-19] 17  (11/07 1217) BP: (144-166)/(61-92) 149/61 (11/07 1217) SpO2:  [97 %-100 %] 98 % (11/07 1217) Physical Exam  Constitutional:  oriented to person, place, and time. Frail HENT: [place]/AT, PERRLA, no scleral icterus Mouth/Throat: Oropharynx is clear and moist. No oropharyngeal exudate.  Cardiovascular: Normal rate, regular rhythm and normal heart sounds.  Pulmonary/Chest: Ef fort normal and breath sounds normal. No respiratory distress.  has no wheezes.  Neck  supple, no nuchal rigidity Abdominal: Soft. Bowel sounds are normal.  exhibits no distension. There is no tenderness.  Lymphadenopathy: no cervical adenopathy. No axillary adenopathy Neurological: alert and oriented to person, place, and time.  Skin: R middle finger with sutures in place on palmar surface. Some swelling but no purulence. Mild erythema  Lab Results  Recent Labs  02/08/16 0335  WBC 8.7  HGB 11.1*  HCT 31.9*  NA 139  K 3.7  CL 106  CO2 25  BUN 10  CREATININE 0.80   Lab Results  Component Value Date   ESRSEDRATE 4452 (H) 02/07/2016     Microbiology: Results for orders placed or performed during the hospital encounter of 02/04/16  Culture, blood (Routine X 2)     Status: None (Preliminary result)   Collection Time: 02/04/16  4:29 PM  Result Value Ref Range Status   Specimen Description BLOOD LEFT WRIST  Final  Special Requests BOTTLES DRAWN AEROBIC AND ANAEROBIC  6CC  Final   Culture NO GROWTH 4 DAYS  Final   Report Status PENDING  Incomplete  Culture, blood (Routine X 2)     Status: None (Preliminary result)   Collection Time: 02/04/16  4:29 PM  Result Value Ref Range Status   Specimen Description BLOOD RIGHT FOREARM  Final   Special Requests BOTTLES DRAWN AEROBIC AND ANAEROBIC  6CC  Final   Culture NO GROWTH 4 DAYS  Final   Report Status PENDING  Incomplete  Aerobic/Anaerobic Culture (surgical/deep wound)     Status: None (Preliminary result)   Collection Time: 02/06/16  2:35 PM  Result Value Ref Range  Status   Specimen Description FINGER RIGHT MIDDLE ABSCESS  Final   Special Requests NONE  Final   Gram Stain   Final    ABUNDANT WBC PRESENT, PREDOMINANTLY PMN RARE GRAM POSITIVE COCCI IN PAIRS AND CHAINS RARE GRAM NEGATIVE COCCOBACILLI [facility]    Culture   Final    RARE STAPHYLOCOCCUS AUREUS NO ANAEROBES ISOLATED; CULTURE IN PROGRESS FOR 5 DAYS    Report Status PENDING  Incomplete    Studies/Results No results found.  Assessment/Plan: [name] is a 82 y.o. female with R middle finger abscess, s/p I and D 11/5 following laceration on a fan.  Gram stain with GPC in chains and GN coccobacilli. Final culture pending but initial with Staph aureus - sensis pending. Clinically improving, no evidence bone or joint involvement. Not immunocompromised, no DM.  I suspect she will respond nicely to oral abx for a 10-14 day course.   Can dc on augmentin and doxy. I will fu on culture  I can see in follow up next week as long as she has fu with ortho later this week. Thank you very much for the consult. Will follow with you.  Lawayne Hartig P   02/08/2016, 2:47 PM

## 2016-02-08 NOTE — Discharge Summary (Addendum)
[facility] [facility] [place] [facility]   PATIENT NAME: [name]    MR#:  [id number]  DATE OF BIRTH:  09-12-1932  DATE OF ADMISSION:  02/04/2016 ADMITTING PHYSICIAN: Katha Hamming, MD  DATE OF DISCHARGE: 02/08/16  PRIMARY CARE PHYSICIAN: Eustaquio Boyden, MD    ADMISSION DIAGNOSIS:  Wound infection [T14.8XXA, L08.9] Cellulitis of right upper extremity [L03.113]  DISCHARGE DIAGNOSIS:  Active Problems:   Cellulitis   SECONDARY DIAGNOSIS:   Past Medical History:  Diagnosis Date  . Arthritis   . Atherosclerosis of abdominal aorta (HCC)    by xray  . Atrial fibrillation (HCC)   . CAD (coronary artery disease)    95% LAD stenosis, [place] x 02 September 1996  . Cervicalgia   . CHF (congestive heart failure) (HCC) 2014   on recent hospitalization  . Chronic interstitial lung disease (HCC) 08/2015   by CXR  . Depression with anxiety    celexa started 04/2010  . Dyslipidemia   . Facial trauma 06/01/2014   Fall 05/2014 without fracture by facial/head CT   . Gout    prior PCP stopped allopurinol  . Hyperlipidemia   . Hypertension   . LBP (low back pain) 2005   spinal stenosis and bulging discs at L2-L5 s/p lumbar laminectomy [place])  . Lymphocytic colitis 10/28/2013  . Osteopenia 07/2013   T score -2.2 L femur  . Peripheral neuropathy (HCC) since feb 2015   right arm   . Prediabetes 2001  . Syncopal episodes 2015   orthostatic, normal EEG and neuro eval ([place])  . T12 compression fracture (HCC) 2015   by CT    HOSPITAL COURSE:  [name]  is a 82 y.o. female admitted 02/04/2016 with chief complaint Wound Infection . Please see H&P performed by Katha Hamming, MD for further information. Patient presented with the above symptoms. Started on antibiotics for right middle finger wound infection, given little change taken to the OR of I&D 02/06/16 without complication. She has made continued improvement  DISCHARGE CONDITIONS:    stable  CONSULTS OBTAINED:  Treatment Team:  Juanell Fairly, MD Mick Sell, MD  DRUG ALLERGIES:   Allergies  Allergen Reactions  . Coumadin [Warfarin Sodium] Other (See Comments)    Makes pass out  . Adhesive [Tape] Rash  . Ivp Dye [Iodinated Diagnostic Agents] Other (See Comments)    Rash and turns purple Rash and turns purple  . Vytorin [Ezetimibe-Simvastatin] Other (See Comments)    leg cramps    DISCHARGE MEDICATIONS:   Current Discharge Medication List    START taking these medications   Details  amoxicillin-clavulanate (AUGMENTIN) 875-125 MG tablet Take 1 tablet by mouth 2 (two) times daily. Qty: 20 tablet, Refills: 0    doxycycline (VIBRAMYCIN) 50 MG capsule Take 2 capsules (100 mg total) by mouth 2 (two) times daily. Qty: 40 capsule, Refills: 0    oxyCODONE (ROXICODONE) 5 MG immediate release tablet Take 1 tablet (5 mg total) by mouth every 4 (four) hours as needed for severe pain. Qty: 30 tablet, Refills: 0      CONTINUE these medications which have NOT CHANGED   Details  allopurinol (ZYLOPRIM) 100 MG tablet Take 100 mg by mouth daily.    aspirin EC 81 MG tablet Take 1 tablet (81 mg total) by mouth daily.    atorvastatin (LIPITOR) 40 MG tablet Take 40 mg by mouth daily.    digoxin (LANOXIN) 0.125 MG tablet Take 0.125 mg by mouth daily.  FLUoxetine (PROZAC) 20 MG capsule Take 20 mg by mouth daily.    furosemide (LASIX) 20 MG tablet Take 0.5 tablets (10 mg total) by mouth daily. Qty: 30 tablet, Refills: 6    lisinopril (PRINIVIL,ZESTRIL) 5 MG tablet Take 5 mg by mouth daily.    metoprolol tartrate (LOPRESSOR) 25 MG tablet Take 25 mg by mouth 2 (two) times daily.    Multiple Vitamins-Minerals (ONE-A-DAY WOMENS 50+ ADVANTAGE) TABS Take 1 tablet by mouth daily.     nitroGLYCERIN (NITROSTAT) 0.4 MG SL tablet Place 0.4 mg under the tongue every 5 (five) minutes as needed for chest pain.      STOP taking these medications     traMADol  (ULTRAM) 50 MG tablet          DISCHARGE INSTRUCTIONS:    DIET:  Cardiac diet  DISCHARGE CONDITION:  Stable  ACTIVITY:  Activity as tolerated  OXYGEN:  Home Oxygen: No.   Oxygen Delivery: room air  DISCHARGE LOCATION:  home   If you experience worsening of your admission symptoms, develop shortness of breath, life threatening emergency, suicidal or homicidal thoughts you must seek medical attention immediately by calling 911 or calling your MD immediately  if symptoms less severe.  You Must read complete instructions/literature along with all the possible adverse reactions/side effects for all the Medicines you take and that have been prescribed to you. Take any new Medicines after you have completely understood and accpet all the possible adverse reactions/side effects.   Please note  You were cared for by a hospitalist during your hospital stay. If you have any questions about your discharge medications or the care you received while you were in the hospital after you are discharged, you can call the unit and asked to speak with the hospitalist on call if the hospitalist that took care of you is not available. Once you are discharged, your primary care physician will handle any further medical issues. Please note that NO REFILLS for any discharge medications will be authorized once you are discharged, as it is imperative that you return to your primary care physician (or establish a relationship with a primary care physician if you do not have one) for your aftercare needs so that they can reassess your need for medications and monitor your lab values.    On the day of Discharge:   VITAL SIGNS:  Blood pressure (!) 147/62, pulse 61, temperature 97.7 F (36.5 C), temperature source Oral, resp. rate 19, height 5\' 4"  (1.626 m), weight 57.6 kg (126 lb 14.4 oz), SpO2 97 %.  I/O:   Intake/Output Summary (Last 24 hours) at 02/08/16 1045 Last data filed at 02/07/16 1800  Gross  per 24 hour  Intake               75 ml  Output                2 ml  Net               73 ml    PHYSICAL EXAMINATION:  GENERAL:  82 y.o.-year-old patient lying in the bed with no acute distress.  EYES: Pupils equal, round, reactive to light and accommodation. No scleral icterus. Extraocular muscles intact.  HEENT: Head atraumatic, normocephalic. Oropharynx and nasopharynx clear.  NECK:  Supple, no jugular venous distention. No thyroid enlargement, no tenderness.  LUNGS: Normal breath sounds bilaterally, no wheezing, rales,rhonchi or crepitation. No use of accessory muscles of respiration.  CARDIOVASCULAR: S1, S2 normal. No  murmurs, rubs, or gallops.  ABDOMEN: Soft, non-tender, non-distended. Bowel sounds present. No organomegaly or mass.  EXTREMITIES: No pedal edema, cyanosis, or clubbing. Right hand dressing clean dry intact NEUROLOGIC: Cranial nerves II through XII are intact. Muscle strength 5/5 in all extremities. Sensation intact. Gait not checked.  PSYCHIATRIC: The patient is alert and oriented x 3.  SKIN: No obvious rash, lesion, or ulcer.   DATA REVIEW:   CBC  Recent Labs Lab 02/08/16 0335  WBC 8.7  HGB 11.1*  HCT 31.9*  PLT 213    Chemistries   Recent Labs Lab 02/04/16 1629  02/08/16 0335  NA 138  < > 139  K 2.8*  < > 3.7  CL 100*  < > 106  CO2 25  < > 25  GLUCOSE 111*  < > 111*  BUN 8  < > 10  CREATININE 0.65  < > 0.80  CALCIUM 9.1  < > 8.5*  AST 28  --   --   ALT 15  --   --   ALKPHOS 123  --   --   BILITOT 0.8  --   --   < > = values in this interval not displayed.  Cardiac Enzymes No results for input(s): TROPONINI in the last 168 hours.  Microbiology Results  Results for orders placed or performed during the hospital encounter of 02/04/16  Culture, blood (Routine X 2)     Status: None (Preliminary result)   Collection Time: 02/04/16  4:29 PM  Result Value Ref Range Status   Specimen Description BLOOD LEFT WRIST  Final   Special Requests  BOTTLES DRAWN AEROBIC AND ANAEROBIC  6CC  Final   Culture NO GROWTH 4 DAYS  Final   Report Status PENDING  Incomplete  Culture, blood (Routine X 2)     Status: None (Preliminary result)   Collection Time: 02/04/16  4:29 PM  Result Value Ref Range Status   Specimen Description BLOOD RIGHT FOREARM  Final   Special Requests BOTTLES DRAWN AEROBIC AND ANAEROBIC  6CC  Final   Culture NO GROWTH 4 DAYS  Final   Report Status PENDING  Incomplete  Aerobic/Anaerobic Culture (surgical/deep wound)     Status: None (Preliminary result)   Collection Time: 02/06/16  2:35 PM  Result Value Ref Range Status   Specimen Description FINGER RIGHT MIDDLE ABSCESS  Final   Special Requests NONE  Final   Gram Stain   Final    ABUNDANT WBC PRESENT, PREDOMINANTLY PMN RARE GRAM POSITIVE COCCI IN PAIRS AND CHAINS RARE GRAM NEGATIVE COCCOBACILLI Performed at [facility]    Culture   Final    CULTURE REINCUBATED FOR BETTER GROWTH NO ANAEROBES ISOLATED; CULTURE IN PROGRESS FOR 5 DAYS    Report Status PENDING  Incomplete    RADIOLOGY:  No results found.   Management plans discussed with the patient, family and they are in agreement.  CODE STATUS:     Code Status Orders        Start     Ordered   02/04/16 1959  Full code  Continuous     02/04/16 1959    Code Status History    Date Active Date Inactive Code Status Order ID Comments User Context   This patient has a current code status but no historical code status.      TOTAL TIME TAKING CARE OF THIS PATIENT: 33 minutes.    Criss Bartles,  Mardi Mainland.D on 02/08/2016 at 10:45 AM  Between 7am to 6pm - Pager - [phone]  After 6pm go to [email or web address] - Scientist, research (life sciences) [facility]  Office  [phone]  CC: Primary care physician; Eustaquio Boyden, MD

## 2016-02-08 NOTE — Progress Notes (Signed)
Can discharge after dressing change

## 2016-02-08 NOTE — Progress Notes (Signed)
While rounding, CH made initial visit to room 119. Pt was sleeping and so silent prayer was provided at door. CH is available for follow up as needed.    02/08/16 1200  Clinical Encounter Type  Visited With Patient  Visit Type Initial;Spiritual support  Referral From Nurse  Spiritual Encounters  Spiritual Needs Prayer

## 2016-02-08 NOTE — Progress Notes (Signed)
  Subjective:  POD #2  S/o I&D of right middle finger.  Patient reports pain as mild.  No fever or chills.   Objective:   VITALS:   Vitals:   02/08/16 0713 02/08/16 0752 02/08/16 0920 02/08/16 1217  BP: (!) 166/82  (!) 147/62 (!) 149/61  Pulse: (!) 55 70 61 66  Resp:    17  Temp: 97.6 F (36.4 C)  97.7 F (36.5 C) 97.8 F (36.6 C)  TempSrc: Oral  Oral Oral  SpO2: 97%  97% 98%  Weight:      Height:        PHYSICAL EXAM:  Right middle finger:  Patient had her dressing removed by me today. Incision is healing. Patient has no active drainage. Erythema and swelling are improved. Finger is well-perfused. Range of motion is limited due to pain and edema. Sensation improved. LABS  Results for orders placed or performed during the hospital encounter of 02/04/16 (from the past 24 hour(s))  Sedimentation rate     Status: Abnormal   Collection Time: 02/07/16  3:52 PM  Result Value Ref Range   Sed Rate 52 (H) 0 - 30 mm/hr  C-reactive protein     Status: Abnormal   Collection Time: 02/07/16  3:52 PM  Result Value Ref Range   CRP 6.3 (H) <1.0 mg/dL  CBC     Status: Abnormal   Collection Time: 02/08/16  3:35 AM  Result Value Ref Range   WBC 8.7 3.6 - 11.0 K/uL   RBC 3.20 (L) 3.80 - 5.20 MIL/uL   Hemoglobin 11.1 (L) 12.0 - 16.0 g/dL   HCT 08.631.9 (L) 57.835.0 - 46.947.0 %   MCV 99.8 80.0 - 100.0 fL   MCH 34.7 (H) 26.0 - 34.0 pg   MCHC 34.8 32.0 - 36.0 g/dL   RDW 62.912.5 52.811.5 - 41.314.5 %   Platelets 213 150 - 440 K/uL  Basic metabolic panel     Status: Abnormal   Collection Time: 02/08/16  3:35 AM  Result Value Ref Range   Sodium 139 135 - 145 mmol/L   Potassium 3.7 3.5 - 5.1 mmol/L   Chloride 106 101 - 111 mmol/L   CO2 25 22 - 32 mmol/L   Glucose, Bld 111 (H) 65 - 99 mg/dL   BUN 10 6 - 20 mg/dL   Creatinine, Ser 2.440.80 0.44 - 1.00 mg/dL   Calcium 8.5 (L) 8.9 - 10.3 mg/dL   GFR calc non Af Amer >60 >60 mL/min   GFR calc Af Amer >60 >60 mL/min   Anion gap 8 5 - 15    No results  found.  Assessment/Plan: 2 Days Post-Op   Active Problems:   Cellulitis  Patient is growing staph and her culture. I have discussed case with Dr. Sampson GoonFitzgerald. Patient may be discharged home on Augmentin and doxycycline. She will follow up with me within one week. Office number is [phone]. Patient may keep bandage on until follow-up. She should keep bandage dry and cover with a plastic bag if she is going to shower. She should contact the office immediately if her dressing gets wet.    Juanell FairlyKRASINSKI, Eiko Mcgowen , MD 02/08/2016, 3:09 PM

## 2016-02-08 NOTE — Progress Notes (Signed)
Received MD order to discharge patient to home, reviewed home meds prescriptions and follow up appointment with patient and patient verbalized understanding discharged to home with family member

## 2016-02-08 NOTE — Evaluation (Signed)
Occupational Therapy Evaluation Patient Details Name: [name] MRN: [id number] DOB: 1932-10-01 Today's Date: 02/08/2016    History of Present Illness Pt. is an 82 y.o. female who was admitted to [facility] for cellulitis following a dominant right hand laceration, and infection.   Clinical Impression   Pt. is an 82 y.o. female who was admitted to [facility] for dominant right hand Cellulitis secondary to Right hand laceration/infection. Pt. resides at home with her son, who will assist her with daily ADL, and self-care tasks. Pt. Is using her left hand for ADL tasks, and Is unable to use her right hand to assist with ADLs. Pt. has DME in place at home. Pt. Is preparing to go home upon arrival. Nursing in to review discharge. Pt. And son education was provided.    Follow Up Recommendations  Outpatient OT (Hand Therapy)    Equipment Recommendations       Recommendations for Other Services Rehab consult     Precautions / Restrictions Restrictions Weight Bearing Restrictions: No                                                     ADL Overall ADL's : Needs assistance/impaired                                       General ADL Comments: Pt. is unable to use her dominant RUE during ADL, and selfcare tasks. Pt. requires minA for ADL tasks using her left hand.      Vision     Perception     Praxis      Pertinent Vitals/Pain Pain Assessment: 0-10 Pain Score: 7  Pain Descriptors / Indicators: Aching;Constant     Hand Dominance Right   Extremity/Trunk Assessment Upper Extremity Assessment Upper Extremity Assessment: Overall WFL for tasks assessed           Communication Communication Communication: No difficulties   Cognition Arousal/Alertness: Awake/alert Behavior During Therapy: WFL for tasks assessed/performed Overall Cognitive Status: Within Functional Limits for tasks assessed                     General  Comments       Exercises       Shoulder Instructions      Home Living Family/patient expects to be discharged to:: Private residence Living Arrangements: Children Available Help at Discharge: Family Type of Home: House Home Access: Stairs to enter Secretary/administrator of Steps: 2 Entrance Stairs-Rails: Right Home Layout: One level     Bathroom Shower/Tub: Tub/shower unit Shower/tub characteristics: Curtain Firefighter: Standard     Home Equipment: Environmental consultant - 2 wheels;Grab bars - tub/shower;Cane - single point;Wheelchair - manual          Prior Functioning/Environment Level of Independence: Independent                 OT Problem List:     OT Treatment/Interventions:      OT Goals(Current goals can be found in the care plan section) Acute Rehab OT Goals Patient Stated Goal: To be able to use her hand again OT Goal Formulation: With patient Potential to Achieve Goals: Good  OT Frequency:     Barriers to D/C:  Co-evaluation              End of Session    Activity Tolerance: Patient tolerated treatment well Patient left: in bed;with call bell/phone within reach;with bed alarm set   Time: 1610-1630 OT Time Calculation (min): 20 min Charges:  OT General Charges $OT Visit: 1 Procedure OT Evaluation $OT Eval Low Complexity: 1 Procedure G-Codes:    Olegario Messier, MS, OTR/L 02/08/2016, 4:52 PM

## 2016-02-09 LAB — CULTURE, BLOOD (ROUTINE X 2)
CULTURE: NO GROWTH
CULTURE: NO GROWTH

## 2016-02-10 ENCOUNTER — Telehealth: Payer: Self-pay

## 2016-02-10 NOTE — Telephone Encounter (Signed)
Transition Care Management Follow-up Telephone Call    Date discharged? 02/08/2016  How have you been since you were released from the hospital? Recovering.   Any patient concerns? Pt has been seeing specialists related to hand injury. Pt also reports increase in black, tarry stools. Pt plans to see GI regarding this change in health status.    Items Reviewed:  Medications reviewed: Yes  Allergies reviewed: Yes  Dietary changes reviewed: No  Referrals reviewed: Yes   Functional Questionnaire:  Independent - I Dependent - D    Activities of Daily Living (ADLs):    Personal hygiene - I Dressing - I Eating - I Maintaining continence - I Transferring - I (may use cane, Laforge, or wheelchair as needed)   Confirmed importance and date/time of follow-up visits scheduled YES  Provider Appointment booked with PCP 02/21/16 @ 1430  Confirmed with patient if condition begins to worsen call PCP or go to the ER.  Patient was given the office number and encouraged to call back with question or concerns: YES

## 2016-02-11 ENCOUNTER — Telehealth: Payer: Self-pay | Admitting: Cardiology

## 2016-02-11 LAB — AEROBIC/ANAEROBIC CULTURE (SURGICAL/DEEP WOUND)

## 2016-02-11 LAB — AEROBIC/ANAEROBIC CULTURE W GRAM STAIN (SURGICAL/DEEP WOUND)

## 2016-02-11 NOTE — Telephone Encounter (Signed)
Pt had surgery on her hand on Sunday. The doctor told her she will have to have surgery again. She is very concerned,she wonder if her heart is strong enough to keep having anthesis and being put to sleep so often? Please call to advise.

## 2016-02-11 NOTE — Telephone Encounter (Signed)
Returned call to patient.She stated she recently fell in her home and cut right hand.Stated she went to [facility] ER.She developed infection in hand, had to have surgery.[place] in [place] last [place] does not remember her name.Stated she will be seeing hand specialist again next week.She wants to ask Dr.Hochrein if he thinks her heart is strong enough for a second surgery.Message sent to Dr.Hochrein for advice.

## 2016-02-11 NOTE — Telephone Encounter (Signed)
She needs to do whatever surgery is necessary to get her hand fixed.  Tell her I wish her well.

## 2016-02-14 NOTE — Telephone Encounter (Signed)
Spoke with Ms [name], letting her know Dr Antoine PocheHochrein respond, she voice thank you

## 2016-02-21 ENCOUNTER — Ambulatory Visit (INDEPENDENT_AMBULATORY_CARE_PROVIDER_SITE_OTHER): Payer: PPO | Admitting: Family Medicine

## 2016-02-21 ENCOUNTER — Encounter: Payer: Self-pay | Admitting: Family Medicine

## 2016-02-21 VITALS — BP 124/82 | HR 72 | Temp 98.1°F | Wt 122.5 lb

## 2016-02-21 DIAGNOSIS — R197 Diarrhea, unspecified: Secondary | ICD-10-CM

## 2016-02-21 DIAGNOSIS — K52832 Lymphocytic colitis: Secondary | ICD-10-CM

## 2016-02-21 DIAGNOSIS — L03011 Cellulitis of right finger: Secondary | ICD-10-CM

## 2016-02-21 DIAGNOSIS — R55 Syncope and collapse: Secondary | ICD-10-CM

## 2016-02-21 DIAGNOSIS — I481 Persistent atrial fibrillation: Secondary | ICD-10-CM | POA: Diagnosis not present

## 2016-02-21 DIAGNOSIS — I4819 Other persistent atrial fibrillation: Secondary | ICD-10-CM

## 2016-02-21 NOTE — Patient Instructions (Addendum)
Pass by lab to pick up stool test.  Schedule GI appointment with Dr Christella HartiganJacobs - see Shirlee LimerickMarion on your way out today.  Keep appointment with hand doctor this afternoon.

## 2016-02-21 NOTE — Assessment & Plan Note (Signed)
Laceration of R middle finger after fall with complication of wound infection necessitating debridement in OR. Wound culture grew MSSA. Completed augmentin/doxy course. Appreciate hand surgery care of patient.

## 2016-02-21 NOTE — Progress Notes (Signed)
Pre visit review using our clinic review tool, if applicable. No additional management support is needed unless otherwise documented below in the visit note. 

## 2016-02-21 NOTE — Assessment & Plan Note (Signed)
Refuses anticoagulant. Continue digoxin and metoprolol.

## 2016-02-21 NOTE — Assessment & Plan Note (Signed)
Recurrent diarrhea, never f/u with Dr Christella HartiganJacobs. In setting of recent abx will need to r/o C diff - stool test ordered today. I have also asked her to f/u with GI.

## 2016-02-21 NOTE — Progress Notes (Signed)
BP 124/82   Pulse 72   Temp 98.1 F (36.7 C) (Oral)   Wt 122 lb 8 oz (55.6 kg)   BMI 21.03 kg/m    CC: TCM hosp f/u visit Subjective:    Patient ID: [name], female    DOB: [birth year], 82 y.o.   MRN: [id number]  HPI: [name] is a 82 y.o. female presenting on 02/21/2016 for Follow-up (hospital)   Recent hospitalization for R middle finger wound infection s/p OR I&D. this happened after fall at home where she lacerated finger on fan blade. Discharged on doxycycline and augmentin antibiotics, as well as oxycodone 5mg  PRN. Blood cx x2 no growth final. Wound culture growing rare MSSA. She finished antibiotics yesterday.   Diarrhea has returned - watery dark stool with urgency without abdominal pain or cramping. Denies abd pain, fevers/chills. H/o lymphocytic colitis 11/2015. Never returned to see  GI (Dr Christella Hartigan) - next available appt was 03/05/2016 so she did not schedule. States she continues imodium twice daily.   DATE OF ADMISSION:  02/04/2016   DATE OF DISCHARGE: 02/08/16 TCM f/u phone call: 02/10/2016  Discharge dx: Active Problems:   Cellulitis  Relevant past medical, surgical, family and social history reviewed and updated as indicated. Interim medical history since our last visit reviewed. Allergies and medications reviewed and updated. Current Outpatient Prescriptions on File Prior to Visit  Medication Sig  . allopurinol (ZYLOPRIM) 100 MG tablet Take 100 mg by mouth daily.  Marland Kitchen aspirin EC 81 MG tablet Take 1 tablet (81 mg total) by mouth daily.  Marland Kitchen atorvastatin (LIPITOR) 40 MG tablet Take 40 mg by mouth daily.  . digoxin (LANOXIN) 0.125 MG tablet Take 0.125 mg by mouth daily.  Marland Kitchen FLUoxetine (PROZAC) 20 MG capsule Take 20 mg by mouth daily.  . furosemide (LASIX) 20 MG tablet Take 0.5 tablets (10 mg total) by mouth daily. (Patient taking differently: Take 10 mg by mouth daily as needed for edema. )  . lisinopril (PRINIVIL,ZESTRIL) 5 MG tablet Take 5 mg  by mouth daily.  . metoprolol tartrate (LOPRESSOR) 25 MG tablet Take 25 mg by mouth 2 (two) times daily.  . Multiple Vitamins-Minerals (ONE-A-DAY WOMENS 50+ ADVANTAGE) TABS Take 1 tablet by mouth daily.   . nitroGLYCERIN (NITROSTAT) 0.4 MG SL tablet Place 0.4 mg under the tongue every 5 (five) minutes as needed for chest pain.   No current facility-administered medications on file prior to visit.     Review of Systems Per HPI unless specifically indicated in ROS section     Objective:    BP 124/82   Pulse 72   Temp 98.1 F (36.7 C) (Oral)   Wt 122 lb 8 oz (55.6 kg)   BMI 21.03 kg/m   Wt Readings from Last 3 Encounters:  02/21/16 122 lb 8 oz (55.6 kg)  02/06/16 126 lb 14.4 oz (57.6 kg)  01/29/16 118 lb (53.5 kg)    Physical Exam  Constitutional: She appears well-developed and well-nourished. No distress.  HENT:  Mouth/Throat: Oropharynx is clear and moist. No oropharyngeal exudate.  Eyes: Conjunctivae are normal. Pupils are equal, round, and reactive to light.  Neck: Normal range of motion. Neck supple.  Cardiovascular: Normal rate, regular rhythm, normal heart sounds and intact distal pulses.   No murmur heard. Sounded regular today  Pulmonary/Chest: Effort normal and breath sounds normal. No respiratory distress. She has no wheezes. She has no rales.  Abdominal: Soft. Normal appearance and bowel sounds are normal. She exhibits  no distension and no mass. There is no hepatosplenomegaly. There is no tenderness. There is no rigidity, no rebound, no guarding, no CVA tenderness and negative Murphy's sign.  Musculoskeletal: She exhibits no edema.  Skin: Skin is warm and dry. No rash noted.  Psychiatric: She has a normal mood and affect.  Nursing note and vitals reviewed.  Results for orders placed or performed during the hospital encounter of 02/04/16  Culture, blood (Routine X 2)  Result Value Ref Range   Specimen Description BLOOD LEFT WRIST    Special Requests BOTTLES DRAWN  AEROBIC AND ANAEROBIC  6CC    Culture NO GROWTH 5 DAYS    Report Status 02/09/2016 FINAL   Culture, blood (Routine X 2)  Result Value Ref Range   Specimen Description BLOOD RIGHT FOREARM    Special Requests BOTTLES DRAWN AEROBIC AND ANAEROBIC  6CC    Culture NO GROWTH 5 DAYS    Report Status 02/09/2016 FINAL   Aerobic/Anaerobic Culture (surgical/deep wound)  Result Value Ref Range   Specimen Description FINGER RIGHT MIDDLE ABSCESS    Special Requests NONE    Gram Stain      ABUNDANT WBC PRESENT, PREDOMINANTLY PMN RARE GRAM POSITIVE COCCI IN PAIRS AND CHAINS RARE GRAM NEGATIVE COCCOBACILLI    Culture      RARE STAPHYLOCOCCUS AUREUS NO ANAEROBES ISOLATED Performed at [facility]    Report Status 02/11/2016 FINAL    Organism ID, Bacteria STAPHYLOCOCCUS AUREUS       Susceptibility   Staphylococcus aureus - MIC*    CIPROFLOXACIN <=0.5 SENSITIVE Sensitive     ERYTHROMYCIN <=0.25 SENSITIVE Sensitive     GENTAMICIN <=0.5 SENSITIVE Sensitive     OXACILLIN <=0.25 SENSITIVE Sensitive     TETRACYCLINE <=1 SENSITIVE Sensitive     VANCOMYCIN 1 SENSITIVE Sensitive     TRIMETH/SULFA <=10 SENSITIVE Sensitive     CLINDAMYCIN <=0.25 SENSITIVE Sensitive     RIFAMPIN <=0.5 SENSITIVE Sensitive     Inducible Clindamycin NEGATIVE Sensitive     * RARE STAPHYLOCOCCUS AUREUS  Comprehensive metabolic panel  Result Value Ref Range   Sodium 138 135 - 145 mmol/L   Potassium 2.8 (L) 3.5 - 5.1 mmol/L   Chloride 100 (L) 101 - 111 mmol/L   CO2 25 22 - 32 mmol/L   Glucose, Bld 111 (H) 65 - 99 mg/dL   BUN 8 6 - 20 mg/dL   Creatinine, Ser 5.63 0.44 - 1.00 mg/dL   Calcium 9.1 8.9 - 87.5 mg/dL   Total Protein 7.3 6.5 - 8.1 g/dL   Albumin 3.9 3.5 - 5.0 g/dL   AST 28 15 - 41 U/L   ALT 15 14 - 54 U/L   Alkaline Phosphatase 123 38 - 126 U/L   Total Bilirubin 0.8 0.3 - 1.2 mg/dL   GFR calc non Af Amer >60 >60 mL/min   GFR calc Af Amer >60 >60 mL/min   Anion gap 13 5 - 15  CBC with Differential    Result Value Ref Range   WBC 9.7 3.6 - 11.0 K/uL   RBC 3.74 (L) 3.80 - 5.20 MIL/uL   Hemoglobin 12.8 12.0 - 16.0 g/dL   HCT 64.3 32.9 - 51.8 %   MCV 100.1 (H) 80.0 - 100.0 fL   MCH 34.3 (H) 26.0 - 34.0 pg   MCHC 34.2 32.0 - 36.0 g/dL   RDW 84.1 66.0 - 63.0 %   Platelets 227 150 - 440 K/uL   Neutrophils Relative % 66 %  Neutro Abs 6.4 1.4 - 6.5 K/uL   Lymphocytes Relative 16 %   Lymphs Abs 1.5 1.0 - 3.6 K/uL   Monocytes Relative 12 %   Monocytes Absolute 1.1 (H) 0.2 - 0.9 K/uL   Eosinophils Relative 5 %   Eosinophils Absolute 0.4 0 - 0.7 K/uL   Basophils Relative 1 %   Basophils Absolute 0.1 0 - 0.1 K/uL  Basic metabolic panel  Result Value Ref Range   Sodium 139 135 - 145 mmol/L   Potassium 3.3 (L) 3.5 - 5.1 mmol/L   Chloride 107 101 - 111 mmol/L   CO2 24 22 - 32 mmol/L   Glucose, Bld 112 (H) 65 - 99 mg/dL   BUN 9 6 - 20 mg/dL   Creatinine, Ser 4.09 0.44 - 1.00 mg/dL   Calcium 8.2 (L) 8.9 - 10.3 mg/dL   GFR calc non Af Amer >60 >60 mL/min   GFR calc Af Amer >60 >60 mL/min   Anion gap 8 5 - 15  CBC  Result Value Ref Range   WBC 7.9 3.6 - 11.0 K/uL   RBC 3.33 (L) 3.80 - 5.20 MIL/uL   Hemoglobin 11.9 (L) 12.0 - 16.0 g/dL   HCT 81.1 (L) 91.4 - 78.2 %   MCV 99.3 80.0 - 100.0 fL   MCH 35.6 (H) 26.0 - 34.0 pg   MCHC 35.8 32.0 - 36.0 g/dL   RDW 95.6 21.3 - 08.6 %   Platelets 191 150 - 440 K/uL  Glucose, capillary  Result Value Ref Range   Glucose-Capillary 102 (H) 65 - 99 mg/dL  Glucose, capillary  Result Value Ref Range   Glucose-Capillary 95 65 - 99 mg/dL  Sedimentation rate  Result Value Ref Range   Sed Rate 52 (H) 0 - 30 mm/hr  C-reactive protein  Result Value Ref Range   CRP 6.3 (H) <1.0 mg/dL  CBC  Result Value Ref Range   WBC 8.7 3.6 - 11.0 K/uL   RBC 3.20 (L) 3.80 - 5.20 MIL/uL   Hemoglobin 11.1 (L) 12.0 - 16.0 g/dL   HCT 57.8 (L) 46.9 - 62.9 %   MCV 99.8 80.0 - 100.0 fL   MCH 34.7 (H) 26.0 - 34.0 pg   MCHC 34.8 32.0 - 36.0 g/dL   RDW 52.8 41.3 -  24.4 %   Platelets 213 150 - 440 K/uL  Basic metabolic panel  Result Value Ref Range   Sodium 139 135 - 145 mmol/L   Potassium 3.7 3.5 - 5.1 mmol/L   Chloride 106 101 - 111 mmol/L   CO2 25 22 - 32 mmol/L   Glucose, Bld 111 (H) 65 - 99 mg/dL   BUN 10 6 - 20 mg/dL   Creatinine, Ser 0.10 0.44 - 1.00 mg/dL   Calcium 8.5 (L) 8.9 - 10.3 mg/dL   GFR calc non Af Amer >60 >60 mL/min   GFR calc Af Amer >60 >60 mL/min   Anion gap 8 5 - 15      Assessment & Plan:   Problem List Items Addressed This Visit    Atrial fibrillation (HCC)    Refuses anticoagulant. Continue digoxin and metoprolol.      Cellulitis of finger of right hand - Primary    Laceration of R middle finger after fall with complication of wound infection necessitating debridement in OR. Wound culture grew MSSA. Completed augmentin/doxy course. Appreciate hand surgery care of patient.       Lymphocytic colitis    Recurrent diarrhea, never  f/u with Dr Christella Hartigan. In setting of recent abx will need to r/o C diff - stool test ordered today. I have also asked her to f/u with GI.       Syncope and collapse    Recurrent falls presumed syncope s/p unrevealing cards/neuro work up.        Other Visit Diagnoses    Diarrhea, unspecified type       Relevant Orders   C. difficile GDH and Toxin A/B       Follow up plan: Return in about 6 weeks (around 04/03/2016) for follow up visit.  Eustaquio Boyden, MD

## 2016-02-22 NOTE — Assessment & Plan Note (Signed)
Recurrent falls presumed syncope s/p unrevealing cards/neuro work up.

## 2016-02-23 LAB — C. DIFFICILE GDH AND TOXIN A/B
C. DIFF TOXIN A/B: NOT DETECTED
C. DIFFICILE GDH: NOT DETECTED

## 2016-03-03 ENCOUNTER — Telehealth: Payer: Self-pay

## 2016-03-03 NOTE — Telephone Encounter (Signed)
[name] review of pts current med list; pt is there but does not know her meds. Reviewed current med list and [name] voiced understanding.nothing further needed.

## 2016-03-06 ENCOUNTER — Ambulatory Visit: Payer: PPO | Admitting: Family Medicine

## 2016-03-08 ENCOUNTER — Ambulatory Visit: Payer: PPO | Admitting: Gastroenterology

## 2016-03-13 ENCOUNTER — Other Ambulatory Visit: Payer: Self-pay | Admitting: Family Medicine

## 2016-03-17 ENCOUNTER — Other Ambulatory Visit: Payer: Self-pay

## 2016-03-17 NOTE — Patient Outreach (Signed)
[facility]) Care Management  03/17/2016  Aram Beechamlizabeth D [place]ne[place] 10/17/1932 [id number]   Referral Date:  03/17/16 Source:  [facility] 03/06/16  LACE Score 8  [address]  [place] [id number] [phone](H) [phone] (M)  Outreach call #1.  Patient reached.  States she received "awful care while at [facility] and will never return there"  Patient states she is not interested in services and hung up the phone.   Case Closed.   [facility] notified Primary MD notified.   Simmie Daviesrystal Jerlyn Pain, MSHL, BSN, RN, CCM  [facility] Care Management Care Management Coordinator [phone] Direct [phone] Cell [phone] Office [phone] Fax [email or web address]

## 2016-04-04 ENCOUNTER — Other Ambulatory Visit: Payer: Self-pay | Admitting: Family Medicine

## 2016-04-11 ENCOUNTER — Ambulatory Visit: Payer: PPO | Admitting: Family Medicine

## 2016-04-22 DIAGNOSIS — M79644 Pain in right finger(s): Secondary | ICD-10-CM | POA: Diagnosis not present

## 2016-05-03 DIAGNOSIS — M79641 Pain in right hand: Secondary | ICD-10-CM | POA: Diagnosis not present

## 2016-05-03 DIAGNOSIS — M961 Postlaminectomy syndrome, not elsewhere classified: Secondary | ICD-10-CM | POA: Diagnosis not present

## 2016-05-03 DIAGNOSIS — G894 Chronic pain syndrome: Secondary | ICD-10-CM | POA: Diagnosis not present

## 2016-05-03 DIAGNOSIS — Z79891 Long term (current) use of opiate analgesic: Secondary | ICD-10-CM | POA: Diagnosis not present

## 2016-06-13 ENCOUNTER — Ambulatory Visit: Payer: PPO | Admitting: Cardiology

## 2016-06-14 NOTE — Progress Notes (Signed)
HPI The patient presents for followup of atrial fibrillation and syncope.  She has been on anticoagulation.  However, she refused to take her Eliquis.  She said she had increased falls on Coumadin and she refuses to take this. The risks have been discussed with her in detail in the past. She adamantly refuses anticoagulation.    Since I last saw her she was in the hospital in the fall after having a fall. She injured her hand and had to get stitches and then this became infected. She now doesn't have the use of the middle finger on her right hand. I did go back and review those hospital records and there was no mention of syncope. It was a mechanical fall and specifically mentions that she tripped. She is very vague about this but there is not been any obvious syncope. She clearly has balance issues and dizziness. We had a long workup of this. She's supposed to be using a [place] but she refuses. She's not been having any new chest pressure, neck or arm discomfort. She's not been having any chest pressure, neck or arm discomfort. She does feel like her heart sometimes although she doesn't realize it always in atrial fibrillation.  Allergies  Allergen Reactions  . Coumadin [Warfarin Sodium] Other (See Comments)    Makes pass out  . Adhesive [Tape] Rash  . Ivp Dye [Iodinated Diagnostic Agents] Other (See Comments)    Rash and turns purple Rash and turns purple  . Vytorin [Ezetimibe-Simvastatin] Other (See Comments)    leg cramps    Current Outpatient Prescriptions  Medication Sig Dispense Refill  . allopurinol (ZYLOPRIM) 100 MG tablet Take 100 mg by mouth daily.    Marland Kitchen. aspirin EC 81 MG tablet Take 1 tablet (81 mg total) by mouth daily.    Marland Kitchen. atorvastatin (LIPITOR) 40 MG tablet Take 40 mg by mouth daily.    Marland Kitchen. DIGOX 125 MCG tablet TAKE ONE TABLET EVERY DAY 30 tablet 6  . FLUoxetine (PROZAC) 20 MG capsule Take 20 mg by mouth daily.    . furosemide (LASIX) 20 MG tablet Take 0.5 tablets (10 mg  total) by mouth daily. (Patient taking differently: Take 10 mg by mouth daily as needed for edema. ) 30 tablet 6  . lisinopril (PRINIVIL,ZESTRIL) 5 MG tablet Take 5 mg by mouth daily.    . metoprolol tartrate (LOPRESSOR) 25 MG tablet Take 25 mg by mouth 2 (two) times daily.    . Multiple Vitamins-Minerals (ONE-A-DAY WOMENS 50+ ADVANTAGE) TABS Take 1 tablet by mouth daily.     . nitroGLYCERIN (NITROSTAT) 0.4 MG SL tablet Place 0.4 mg under the tongue every 5 (five) minutes as needed for chest pain.     No current facility-administered medications for this visit.     Past Medical History:  Diagnosis Date  . Arthritis   . Atherosclerosis of abdominal aorta (HCC)    by xray  . Atrial fibrillation (HCC)   . CAD (coronary artery disease)    95% LAD stenosis, [place] x 02 September 1996  . Cervicalgia   . CHF (congestive heart failure) (HCC) 2014   on recent hospitalization  . Chronic interstitial lung disease (HCC) 08/2015   by CXR  . Depression with anxiety    celexa started 04/2010  . Dyslipidemia   . Facial trauma 06/01/2014   Fall 05/2014 without fracture by facial/head CT   . Gout    prior PCP stopped allopurinol  . Hyperlipidemia   . Hypertension   .  LBP (low back pain) 2005   spinal stenosis and bulging discs at L2-L5 s/p lumbar laminectomy [facility] Quint)  . Lymphocytic colitis 10/28/2013  . Osteopenia 07/2013   T score -2.2 L femur  . Peripheral neuropathy (HCC) since feb 2015   right arm   . Prediabetes 2001  . Syncopal episodes 2015   orthostatic, normal EEG and neuro eval ([place])  . T12 compression fracture (HCC) 2015   by CT    Past Surgical History:  Procedure Laterality Date  . CARDIAC CATHETERIZATION    . CATARACT EXTRACTION Bilateral   . COLONOSCOPY WITH PROPOFOL N/A 10/16/2013   Rachael Fee, MD  . DEXA  07/2013   T score -2.2 L femur  . INCISION AND DRAINAGE Right 02/06/2016   Procedure: INCISION AND DRAINAGE right middle finger;  Surgeon: Juanell Fairly, MD;   Location: [facility] ORS;  Service: Orthopedics;  Laterality: Right;  . LUMBAR LAMINECTOMY  2005   Dr. Dutch Quint  . PERCUTANEOUS CORONARY STENT INTERVENTION (PCI-S)     stent x3 in heart  . TONSILLECTOMY    . TUBAL LIGATION      ROS:  Insomnia, back pain, joint pain.  Otherwise as stated in the HPI and negative for all other systems.  PHYSICAL EXAM BP 132/60 (BP Location: Left Arm, Patient Position: Sitting, Cuff Size: Normal)   Pulse 63   Ht 5\' 4"  (1.626 m)   Wt 127 lb (57.6 kg)   BMI 21.80 kg/m  GENERAL:  Well appearing NECK:  No jugular venous distention, waveform within normal limits, carotid upstroke brisk and symmetric, no bruits, no thyromegaly LUNGS:  Clear to auscultation bilaterally BACK:  No CVA tenderness but tender to palpation over the left chest at the site of her rash. HEART:  PMI not displaced or sustained,S1 and S2 within normal limits, no S3, no clicks, no rubs, no murmurs, irregular ABD:  Flat, positive bowel sounds normal in frequency in pitch, no bruits, no rebound, no guarding, no midline pulsatile mass, no hepatomegaly, no splenomegaly EXT:  2 plus pulses throughout, trace edema, no cyanosis no clubbing, right ring finger scar NEURO:  Non focal  EKG:  Atrial fibrillation, rate 63, axis within normal limits, intervals within normal limits, nonspecific diffuse ST changes.  No change from previous. 06/15/2016  ASSESSMENT AND PLAN  DIASTOLIC HF:   She seems to be euvolemic.  She is tolerating a very low dose of diuretic because  No change in therapy is indicated.     CAD -  She had a negative stress perfusion study in 2013  No testing is indicated at this point.     ESSENTIAL HYPERTENSION, BENIGN -  The blood pressure is at target. No change in medications is indicated. We will continue with therapeutic lifestyle changes (TLC).    ATRIAL FIBRILLATION -  Ms. ERMINE STEBBINS has a CHA2DS2 - VASc score of 5 with a risk of stroke of 6.7%  . She refuses to take  anticoagulation.  This might not be unreasonable as she seems to be high risk for falls.  SYNCOPE - I looked through the hospital notes carefully I cannot document recurrent syncope. Aside from that one episode there wasn't any suggestion of syncope and it looks like the episode where she injured her finger was a fall. I have cautioned her to use a Thom and she says she's she will agree.

## 2016-06-15 ENCOUNTER — Ambulatory Visit (INDEPENDENT_AMBULATORY_CARE_PROVIDER_SITE_OTHER): Payer: Medicare Other | Admitting: Cardiology

## 2016-06-15 ENCOUNTER — Encounter: Payer: Self-pay | Admitting: Cardiology

## 2016-06-15 VITALS — BP 132/60 | HR 63 | Ht 64.0 in | Wt 127.0 lb

## 2016-06-15 DIAGNOSIS — I4819 Other persistent atrial fibrillation: Secondary | ICD-10-CM

## 2016-06-15 DIAGNOSIS — I2510 Atherosclerotic heart disease of native coronary artery without angina pectoris: Secondary | ICD-10-CM

## 2016-06-15 DIAGNOSIS — I5032 Chronic diastolic (congestive) heart failure: Secondary | ICD-10-CM | POA: Diagnosis not present

## 2016-06-15 DIAGNOSIS — R55 Syncope and collapse: Secondary | ICD-10-CM

## 2016-06-15 DIAGNOSIS — I10 Essential (primary) hypertension: Secondary | ICD-10-CM

## 2016-06-15 DIAGNOSIS — I481 Persistent atrial fibrillation: Secondary | ICD-10-CM | POA: Diagnosis not present

## 2016-06-15 NOTE — Patient Instructions (Signed)

## 2016-06-16 ENCOUNTER — Other Ambulatory Visit: Payer: Self-pay | Admitting: Family Medicine

## 2016-06-20 ENCOUNTER — Telehealth: Payer: Self-pay

## 2016-06-20 NOTE — Telephone Encounter (Signed)
Pt has appt to see Dr Reece AgarG on 06/22/16. Pt said her legs are very swollen and pt just saw card. Pt will call cardiologist about leg swelling and perineal area is itchy and swollen but offered pt an appt for 06/21/16 and pt wants to wait to see Dr Reece AgarG on 06/22/16. Pt will cb if symptoms worsen before appt. FYI to Dr Reece AgarG.

## 2016-06-22 ENCOUNTER — Ambulatory Visit: Payer: PPO | Admitting: Family Medicine

## 2016-06-22 NOTE — Telephone Encounter (Signed)
Pt missed appt today - plz call to f/u on leg swelling.

## 2016-06-23 ENCOUNTER — Telehealth: Payer: Self-pay | Admitting: Family Medicine

## 2016-06-23 NOTE — Telephone Encounter (Signed)
Pt walked in  And no showed appt on 06/22/16; perineal area itchy and pt wonders if has UTI. Pt did not want to be seen by anyone other than Dr [name]; rescheduled with Dr [name] on 06/29/13 at 5 pm.FYI to Dr [name].

## 2016-06-23 NOTE — Telephone Encounter (Signed)
Message left for patient to return my call.  

## 2016-06-26 NOTE — Telephone Encounter (Signed)
On schedule for this week for follow up.

## 2016-06-28 DIAGNOSIS — G894 Chronic pain syndrome: Secondary | ICD-10-CM | POA: Diagnosis not present

## 2016-06-28 DIAGNOSIS — M961 Postlaminectomy syndrome, not elsewhere classified: Secondary | ICD-10-CM | POA: Diagnosis not present

## 2016-06-28 DIAGNOSIS — Z79891 Long term (current) use of opiate analgesic: Secondary | ICD-10-CM | POA: Diagnosis not present

## 2016-06-28 DIAGNOSIS — M79641 Pain in right hand: Secondary | ICD-10-CM | POA: Diagnosis not present

## 2016-06-29 ENCOUNTER — Ambulatory Visit (INDEPENDENT_AMBULATORY_CARE_PROVIDER_SITE_OTHER): Payer: Medicare Other | Admitting: Family Medicine

## 2016-06-29 ENCOUNTER — Encounter: Payer: Self-pay | Admitting: Family Medicine

## 2016-06-29 VITALS — BP 144/70 | HR 72 | Temp 97.5°F | Wt 128.2 lb

## 2016-06-29 DIAGNOSIS — I481 Persistent atrial fibrillation: Secondary | ICD-10-CM

## 2016-06-29 DIAGNOSIS — B3731 Acute candidiasis of vulva and vagina: Secondary | ICD-10-CM

## 2016-06-29 DIAGNOSIS — R300 Dysuria: Secondary | ICD-10-CM | POA: Diagnosis not present

## 2016-06-29 DIAGNOSIS — R21 Rash and other nonspecific skin eruption: Secondary | ICD-10-CM

## 2016-06-29 DIAGNOSIS — B373 Candidiasis of vulva and vagina: Secondary | ICD-10-CM | POA: Diagnosis not present

## 2016-06-29 DIAGNOSIS — N3000 Acute cystitis without hematuria: Secondary | ICD-10-CM

## 2016-06-29 DIAGNOSIS — I4819 Other persistent atrial fibrillation: Secondary | ICD-10-CM

## 2016-06-29 LAB — POC URINALSYSI DIPSTICK (AUTOMATED)
BILIRUBIN UA: NEGATIVE
GLUCOSE UA: NEGATIVE
Ketones, UA: NEGATIVE
RBC UA: NEGATIVE
SPEC GRAV UA: 1.03 (ref 1.030–1.035)
Urobilinogen, UA: NEGATIVE (ref ?–2.0)
pH, UA: 6 (ref 5.0–8.0)

## 2016-06-29 MED ORDER — PERMETHRIN 5 % EX CREA: 1.0000 "application " | TOPICAL_CREAM | Freq: Once | CUTANEOUS | 0 refills | Status: AC

## 2016-06-29 MED ORDER — CEPHALEXIN 500 MG PO CAPS: 500.0000 mg | ORAL_CAPSULE | Freq: Two times a day (BID) | ORAL | 0 refills | Status: DC

## 2016-06-29 NOTE — Progress Notes (Signed)
Pre visit review using our clinic review tool, if applicable. No additional management support is needed unless otherwise documented below in the visit note. 

## 2016-06-29 NOTE — Progress Notes (Signed)
BP (!) 144/70   Pulse 72 Comment: Irregular  Temp 97.5 F (36.4 C) (Oral)   Wt 128 lb 4 oz (58.2 kg)   BMI 22.01 kg/m    CC: skin rash, ?UTI Subjective:    Patient ID: [name], female    DOB: 03/25/1933, 83 y.o.   MRN: [id number]  HPI: [name] is a 83 y.o. female presenting on 06/29/2016 for Follow-up   1 wk h/o skin rash throughout body. Very itchy.  She did start using new mattress cover from [employer] recently.  1 wk h/o dysuria, frequency with some nausea. No urgency, fever, abd pain, back pain, blood in urine.  No vaginal discharge. Some rectal itching.  Denies new beds or contacts with similar skin rash.   Relevant past medical, surgical, family and social history reviewed and updated as indicated. Interim medical history since our last visit reviewed. Allergies and medications reviewed and updated. Outpatient Medications Prior to Visit  Medication Sig Dispense Refill  . allopurinol (ZYLOPRIM) 100 MG tablet Take 100 mg by mouth daily.    Marland Kitchen. aspirin EC 81 MG tablet Take 1 tablet (81 mg total) by mouth daily.    Marland Kitchen. atorvastatin (LIPITOR) 40 MG tablet Take 40 mg by mouth daily.    Marland Kitchen. DIGOX 125 MCG tablet TAKE ONE TABLET EVERY DAY 30 tablet 6  . FLUoxetine (PROZAC) 20 MG capsule Take 20 mg by mouth daily.    . furosemide (LASIX) 20 MG tablet Take 0.5 tablets (10 mg total) by mouth daily. (Patient taking differently: Take 10 mg by mouth daily as needed for edema. ) 30 tablet 6  . lisinopril (PRINIVIL,ZESTRIL) 5 MG tablet Take 5 mg by mouth daily.    . metoprolol tartrate (LOPRESSOR) 25 MG tablet TAKE ONE TABLET BY MOUTH TWICE DAILY 60 tablet 3  . Multiple Vitamins-Minerals (ONE-A-DAY WOMENS 50+ ADVANTAGE) TABS Take 1 tablet by mouth daily.     . nitroGLYCERIN (NITROSTAT) 0.4 MG SL tablet Place 0.4 mg under the tongue every 5 (five) minutes as needed for chest pain.    . metoprolol tartrate (LOPRESSOR) 25 MG tablet Take 25 mg by mouth 2 (two) times daily.      No facility-administered medications prior to visit.      Per HPI unless specifically indicated in ROS section below Review of Systems     Objective:    BP (!) 144/70   Pulse 72 Comment: Irregular  Temp 97.5 F (36.4 C) (Oral)   Wt 128 lb 4 oz (58.2 kg)   BMI 22.01 kg/m   Wt Readings from Last 3 Encounters:  06/29/16 128 lb 4 oz (58.2 kg)  06/15/16 127 lb (57.6 kg)  02/21/16 122 lb 8 oz (55.6 kg)    Physical Exam  Constitutional: She appears well-developed and well-nourished. No distress.  HENT:  Mouth/Throat: Oropharynx is clear and moist. No oropharyngeal exudate.  Cardiovascular: Normal rate, regular rhythm, normal heart sounds and intact distal pulses.   No murmur heard. Pulmonary/Chest: Effort normal and breath sounds normal. No respiratory distress. She has no wheezes. She has no rales. She exhibits no tenderness.  Abdominal: Soft. Bowel sounds are normal. She exhibits no distension and no mass. There is tenderness (mild diffuse). There is no rebound.  Musculoskeletal: She exhibits no edema.  Skin: Rash noted. There is erythema.  Intensely pruritic erythematous papular rash throughout body, spares face Pruritic erythematous raw perianal and labial area   Psychiatric:  Tangential, difficult to focus as always  Nursing note and vitals reviewed.  Results for orders placed or performed in visit on 06/29/16  Urine culture  Result Value Ref Range   Colony Count Greater than 100,000 CFU/mL    Organism ID, Bacteria GROUP B STREP (S.AGALACTIAE) ISOLATED   POCT Urinalysis Dipstick (Automated)  Result Value Ref Range   Color, UA DARK YELLOW    Clarity, UA CLEAR    Glucose, UA NEG    Bilirubin, UA NEG    Ketones, UA NEG    Spec Grav, UA 1.030 1.030 - 1.035   Blood, UA NEG    pH, UA 6.0 5.0 - 8.0   Protein, UA +-    Urobilinogen, UA negative Negative - 2.0   Nitrite, UA -    Leukocytes, UA small (1+) (A) Negative    Lab Results  Component Value Date   TSH 3.77  04/06/2015      Assessment & Plan:   Problem List Items Addressed This Visit    Atrial fibrillation (HCC)    Chronic atrial fibrillation, pt refuses anticoagulation.       Candidal vulvovaginitis    Anticipate recurrence - will treat with nystatin cream sent to pharmacy.      Relevant Medications   cephALEXin (KEFLEX) 500 MG capsule   Rash and nonspecific skin eruption    Recurrent skin rash, spares face. Anticipate scabies infestation. Treat with permethrin cream 5% sent to pharmacy. Discussed use of cream and need wash all bedding.      UTI (urinary tract infection) - Primary    Anticipate UTI with UA showing 1+ LE. UCx sent. Start keflex 500mg  bid x 7 days.       Relevant Medications   cephALEXin (KEFLEX) 500 MG capsule    Other Visit Diagnoses    Dysuria       Relevant Orders   POCT Urinalysis Dipstick (Automated) (Completed)   Urine culture (Completed)       Follow up plan: Return if symptoms worsen or fail to improve.  Eustaquio Boyden, MD

## 2016-06-29 NOTE — Patient Instructions (Addendum)
I think you have scabies - permethrin cream sent to pharmacy.  For itching - take benadryl 25mg  as needed - mainly at night as it will make you sleepy.  For UTI - urine culture sent. Treat with keflex antibiotic twice daily for 1 week.  Let us know if not improving.   Scabies, Adult Scabies is a skin condition that happens when very small insects get under the skin (infestation). This causes a rash and severe itchiness. Scabies can spread from person to person (is contagious). If you get scabies, it is common for others in your household to get scabies too. With proper treatment, symptoms usually go away in 2-4 weeks. Scabies usually does not cause lasting problems. What are the causes? This condition is caused by mites (Sarcoptes scabiei, or human itch mites) that can only be seen with a microscope. The mites get into the top layer of skin and lay eggs. Scabies can spread from person to person through:  Close contact with a person who has scabies.  Contact with infested items, such as towels, bedding, or clothing. What increases the risk? This condition is more likely to develop in:  People who live in nursing homes and other extended-care facilities.  People who have sexual contact with a partner who has scabies.  Young children who attend child care facilities.  People who care for others who are at increased risk for scabies. What are the signs or symptoms? Symptoms of this condition may include:  Severe itchiness. This is often worse at night.  A rash that includes tiny red bumps or blisters. The rash commonly occurs on the wrist, elbow, armpit, fingers, waist, groin, or buttocks. Bumps may form a line (burrow) in some areas.  Skin irritation. This can include scaly patches or sores. How is this diagnosed? This condition is diagnosed with a physical exam. Your health care provider will look closely at your skin. In some cases, your health care provider may take a sample of your  affected skin (skin scraping) and have it examined under a microscope. How is this treated? This condition may be treated with:  Medicated cream or lotion that kills the mites. This is spread on the entire body and left on for several hours. Usually, one treatment with medicated cream or lotion is enough to kill all of the mites. In severe cases, the treatment may be repeated.  Medicated cream that relieves itching.  Medicines that help to relieve itching.  Medicines that kill the mites. This treatment is rarely used. Follow these instructions at home:   Medicines   Take or apply over-the-counter and prescription medicines as told by your health care provider.  Apply medicated cream or lotion as told by your health care provider.  Do not wash off the medicated cream or lotion until the necessary amount of time has passed. Skin Care   Avoid scratching your affected skin.  Keep your fingernails closely trimmed to reduce injury from scratching.  Take cool baths or apply cool washcloths to help reduce itching. General instructions   Clean all items that you recently had contact with, including bedding, clothing, and furniture. Do this on the same day that your treatment starts.  Use hot water when you wash items.  Place unwashable items into closed, airtight plastic bags for at least 3 days. The mites cannot live for more than 3 days away from human skin.  Vacuum furniture and mattresses that you use.  Make sure that other people who may have  been infested are examined by a health care provider. These include members of your household and anyone who may have had contact with infested items.  Keep all follow-up visits as told by your health care provider. This is important. Contact a health care provider if:  You have itching that does not go away after 4 weeks of treatment.  You continue to develop new bumps or burrows.  You have redness, swelling, or pain in your rash area  after treatment.  You have fluid, blood, or pus coming from your rash. This information is not intended to replace advice given to you by your health care provider. Make sure you discuss any questions you have with your health care provider. Document Released: 12/09/2014 Document Revised: 08/26/2015 Document Reviewed: 10/20/2014 Elsevier Interactive Patient Education  2017 [employer].

## 2016-06-30 LAB — URINE CULTURE

## 2016-07-01 ENCOUNTER — Other Ambulatory Visit: Payer: Self-pay | Admitting: Family Medicine

## 2016-07-01 DIAGNOSIS — N390 Urinary tract infection, site not specified: Secondary | ICD-10-CM | POA: Insufficient documentation

## 2016-07-01 MED ORDER — NYSTATIN 100000 UNIT/GM EX CREA: 1.0000 "application " | TOPICAL_CREAM | Freq: Two times a day (BID) | CUTANEOUS | 1 refills | Status: DC

## 2016-07-01 NOTE — Assessment & Plan Note (Signed)
Chronic atrial fibrillation, pt refuses anticoagulation.

## 2016-07-01 NOTE — Assessment & Plan Note (Signed)
Anticipate UTI with UA showing 1+ LE. UCx sent. Start keflex  bid x 7 days.

## 2016-07-01 NOTE — Assessment & Plan Note (Signed)
Recurrent skin rash, spares face. Anticipate scabies infestation. Treat with permethrin cream 5% sent to pharmacy. Discussed use of cream and need wash all bedding.

## 2016-07-01 NOTE — Assessment & Plan Note (Signed)
Anticipate recurrence - will treat with nystatin cream sent to pharmacy.

## 2016-07-05 ENCOUNTER — Telehealth: Payer: Self-pay | Admitting: *Deleted

## 2016-07-05 NOTE — Telephone Encounter (Signed)
When speaking with patient in regards to labs, attempted to scheduled physical. She said she would call back and schedule once her rash is gone and she knows when her son can bring her.

## 2016-07-11 ENCOUNTER — Other Ambulatory Visit: Payer: Self-pay | Admitting: Physical Medicine and Rehabilitation

## 2016-07-11 DIAGNOSIS — M545 Low back pain: Secondary | ICD-10-CM

## 2016-07-21 ENCOUNTER — Encounter: Payer: Self-pay | Admitting: Primary Care

## 2016-07-21 ENCOUNTER — Ambulatory Visit (INDEPENDENT_AMBULATORY_CARE_PROVIDER_SITE_OTHER): Payer: Medicare Other | Admitting: Primary Care

## 2016-07-21 VITALS — BP 134/70 | HR 74 | Temp 97.4°F | Ht 64.0 in | Wt 127.0 lb

## 2016-07-21 DIAGNOSIS — R350 Frequency of micturition: Secondary | ICD-10-CM | POA: Diagnosis not present

## 2016-07-21 DIAGNOSIS — R197 Diarrhea, unspecified: Secondary | ICD-10-CM

## 2016-07-21 LAB — CBC WITH DIFFERENTIAL/PLATELET
Basophils Absolute: 0.1 10*3/uL (ref 0.0–0.1)
Basophils Relative: 1.4 % (ref 0.0–3.0)
Eosinophils Absolute: 0.7 10*3/uL (ref 0.0–0.7)
Eosinophils Relative: 8.8 % — ABNORMAL HIGH (ref 0.0–5.0)
HCT: 37.3 % (ref 36.0–46.0)
HEMOGLOBIN: 12.3 g/dL (ref 12.0–15.0)
LYMPHS ABS: 1.2 10*3/uL (ref 0.7–4.0)
Lymphocytes Relative: 14.8 % (ref 12.0–46.0)
MCHC: 32.9 g/dL (ref 30.0–36.0)
MCV: 101.2 fl — AB (ref 78.0–100.0)
MONO ABS: 0.6 10*3/uL (ref 0.1–1.0)
Monocytes Relative: 7.3 % (ref 3.0–12.0)
NEUTROS PCT: 67.7 % (ref 43.0–77.0)
Neutro Abs: 5.4 10*3/uL (ref 1.4–7.7)
Platelets: 204 10*3/uL (ref 150.0–400.0)
RBC: 3.69 Mil/uL — ABNORMAL LOW (ref 3.87–5.11)
RDW: 13.1 % (ref 11.5–15.5)
WBC: 7.9 10*3/uL (ref 4.0–10.5)

## 2016-07-21 LAB — POC URINALSYSI DIPSTICK (AUTOMATED)
Bilirubin, UA: NEGATIVE
GLUCOSE UA: NEGATIVE
Ketones, UA: NEGATIVE
Leukocytes, UA: NEGATIVE
Nitrite, UA: NEGATIVE
PH UA: 6 (ref 5.0–8.0)
RBC UA: NEGATIVE
Spec Grav, UA: >=1.03 — AB (ref 1.010–1.025)
UROBILINOGEN UA: NEGATIVE U/dL — AB

## 2016-07-21 LAB — COMPREHENSIVE METABOLIC PANEL
ALK PHOS: 99 U/L (ref 39–117)
ALT: 11 U/L (ref 0–35)
AST: 21 U/L (ref 0–37)
Albumin: 4 g/dL (ref 3.5–5.2)
BUN: 9 mg/dL (ref 6–23)
CO2: 28 mEq/L (ref 19–32)
CREATININE: 0.87 mg/dL (ref 0.40–1.20)
Calcium: 9.6 mg/dL (ref 8.4–10.5)
Chloride: 103 mEq/L (ref 96–112)
GFR: 66.23 mL/min (ref >60.00)
Glucose, Bld: 120 mg/dL — ABNORMAL HIGH (ref 70–99)
Potassium: 3.5 mEq/L (ref 3.5–5.1)
SODIUM: 140 meq/L (ref 135–145)
Total Bilirubin: 0.8 mg/dL (ref 0.2–1.2)
Total Protein: 6.5 g/dL (ref 6.0–8.3)

## 2016-07-21 NOTE — Addendum Note (Signed)
Addended by: Tawnya Crook on: 07/21/2016 12:31 PM   Modules accepted: Orders

## 2016-07-21 NOTE — Progress Notes (Signed)
Subjective:    Patient ID: [name], female    DOB: 1933-03-18, 83 y.o.   MRN: [id number]  HPI  Ms. Forrey is a 83 year old female with a history of UTI and lymphocytic colitis who presents today with multiple complaints.  1) Diarrhea: Present since yesterday with rectal burning. She's had at least 20 episodes of diarrhea. Now she's noticing yellow substance without stool since she had so much diarrhea. She's had some nausea, denies vomiting. She's noticed RLQ abdominal discomfort that she noticed during her diarrhea yesterday, no abdominal pain today. She's also noticed some bright red blood during her episodes yesterday. She's had three episodes of diarrhea this morning.   She ate chicken noodle soup yesterday and is drinking cherry pepsi, water, and cranberry juice. Today she's feeling weak and tired. She's not taken anything OTC for her symptoms. She has had no recent antibiotic use.   2) Urinary Frequency: Also with dysuria. Present since yesterday. She denies fevers, flank pain, vaginal discharge. She has noticed exterior vaginal itching.   Review of Systems  Constitutional: Negative for fatigue and fever.  Gastrointestinal: Positive for diarrhea and nausea. Negative for abdominal pain and vomiting.  Genitourinary: Positive for dysuria and frequency. Negative for vaginal discharge.  Skin:       Itching to exterior vagina, not over labia.  Neurological: Positive for weakness.       Past Medical History:  Diagnosis Date  . Arthritis   . Atherosclerosis of abdominal aorta (HCC)    by xray  . Atrial fibrillation (HCC)   . CAD (coronary artery disease)    95% LAD stenosis, [place] x 02 September 1996  . Cervicalgia   . CHF (congestive heart failure) (HCC) 2014   on recent hospitalization  . Chronic interstitial lung disease (HCC) 08/2015   by CXR  . Depression with anxiety    celexa started 04/2010  . Dyslipidemia   . Facial trauma 06/01/2014   Fall 05/2014 without fracture  by facial/head CT   . Gout    prior PCP stopped allopurinol  . Hyperlipidemia   . Hypertension   . LBP (low back pain) 2005   spinal stenosis and bulging discs at L2-L5 s/p lumbar laminectomy Dutch Quint)  . Lymphocytic colitis 10/28/2013  . Osteopenia 07/2013   T score -2.2 L femur  . Peripheral neuropathy since feb 2015   right arm   . Prediabetes 2001  . Syncopal episodes 2015   orthostatic, normal EEG and neuro eval ([place])  . T12 compression fracture (HCC) 2015   by CT     Social History   Social History  . Marital status: Widowed    Spouse name: N/A  . Number of children: 1  . Years of education: N/A   Occupational History  .  Retired   Social History Main Topics  . Smoking status: Never Smoker  . Smokeless tobacco: Never Used  . Alcohol use Yes     Comment: Occasionally drinks beer  . Drug use: No  . Sexual activity: Not on file   Other Topics Concern  . 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 CARDIAC CATHETERIZATION    . CATARACT EXTRACTION Bilateral   . COLONOSCOPY WITH PROPOFOL N/A 10/16/2013  Rachael Fee, MD  . DEXA  07/2013   T score -2.2 L femur  . INCISION AND DRAINAGE Right 02/06/2016   Procedure: INCISION AND DRAINAGE right middle finger;  Surgeon: Juanell Fairly, MD;  Location: [facility] ORS;  Service: Orthopedics;  Laterality: Right;  . LUMBAR LAMINECTOMY  2005   Dr. Dutch Quint  . PERCUTANEOUS CORONARY STENT INTERVENTION (PCI-S)     stent x3 in heart  . TONSILLECTOMY    . TUBAL LIGATION      Family History  Problem Relation Age of Onset  . Prostate cancer Father   . Breast cancer Sister 17  . Heart disease Brother   . Heart disease Other     First degree relatives with heart disease but later onset  . Heart disease Sister     pacer  . Esophageal  cancer Neg Hx   . Colon cancer Neg Hx     Allergies  Allergen Reactions  . Coumadin [Warfarin Sodium] Other (See Comments)    Makes pass out  . Adhesive [Tape] Rash  . Ivp Dye [Iodinated Diagnostic Agents] Other (See Comments)    Rash and turns purple Rash and turns purple  . Vytorin [Ezetimibe-Simvastatin] Other (See Comments)    leg cramps    Current Outpatient Prescriptions on File Prior to Visit  Medication Sig Dispense Refill  . allopurinol (ZYLOPRIM) 100 MG tablet Take 100 mg by mouth daily.    Marland Kitchen aspirin EC 81 MG tablet Take 1 tablet (81 mg total) by mouth daily.    Marland Kitchen atorvastatin (LIPITOR) 40 MG tablet Take 40 mg by mouth daily.    Marland Kitchen DIGOX 125 MCG tablet TAKE ONE TABLET EVERY DAY 30 tablet 6  . FLUoxetine (PROZAC) 20 MG capsule Take 20 mg by mouth daily.    . furosemide (LASIX) 20 MG tablet Take 0.5 tablets (10 mg total) by mouth daily. (Patient taking differently: Take 10 mg by mouth daily as needed for edema. ) 30 tablet 6  . lisinopril (PRINIVIL,ZESTRIL) 5 MG tablet Take 5 mg by mouth daily.    . metoprolol tartrate (LOPRESSOR) 25 MG tablet TAKE ONE TABLET BY MOUTH TWICE DAILY 60 tablet 3  . Multiple Vitamins-Minerals (ONE-A-DAY WOMENS 50+ ADVANTAGE) TABS Take 1 tablet by mouth daily.     . nitroGLYCERIN (NITROSTAT) 0.4 MG SL tablet Place 0.4 mg under the tongue every 5 (five) minutes as needed for chest pain.    Marland Kitchen nystatin cream (MYCOSTATIN) Apply 1 application topically 2 (two) times daily. 45 g 1   No current facility-administered medications on file prior to visit.     BP 134/70   Pulse 74   Temp 97.4 F (36.3 C) (Oral)   Ht  (1.626 m)   Wt 127 lb (57.6 kg)   SpO2 98%   BMI 21.80 kg/m    Objective:   Physical Exam  Constitutional: She appears well-nourished. She does not appear ill.  Neck: Neck supple.  Cardiovascular: Normal rate and regular rhythm.   Pulmonary/Chest: Effort normal and breath sounds normal.  Abdominal: Soft. Bowel sounds are  normal. There is tenderness in the right lower quadrant.  Mild tenderness to RLQ  Genitourinary: Rectal exam shows external hemorrhoid.  Genitourinary Comments: 1 small, non thrombosed external hemorrhoid. Skin irritation to rectum.   Skin: Skin is warm and dry.  Mild irritation around rectum. Irritation to upper exterior vagina, no rash other than irritation from scratching.          Assessment & Plan:  Diarrhea:  Present for 24 hours with red bleeding, nausea, RLQ abdominal pain that has resolved.  Exam today with mild tenderness, other wise stable vitals, is in no distress. Rectal exam stable. She does not appear acutely dehydrated. Consider colitis, check CBC to ensure no infection. Will have her work on hydration, advance diet as tolerated. Discussed use of A&D ointment to rectum, cortisone cream to exterior vagina. UA: Negative. Strict return precautions provided. Await labs.  Morrie Sheldon, NP

## 2016-07-21 NOTE — Patient Instructions (Signed)
Apply hydrocortisone cream twice daily to the rash on the vagina. This may be purchased over the counter.  Put some A&D ointment on your rectum to treat skin irritation and prevent breakdown. This may be purchased over the counter.  Complete lab work prior to leaving today. I will notify you of your results once received.   Advance your diet as tolerated, take a look at the information below.  You must stay hydrated with water to   It was a pleasure meeting you!   Food Choices to Help Relieve Diarrhea, Adult When you have diarrhea, the foods you eat and your eating habits are very important. Choosing the right foods and drinks can help:  Relieve diarrhea.  Replace lost fluids and nutrients.  Prevent dehydration. What general guidelines should I follow? Relieving diarrhea   Choose foods with less than 2 g or .07 oz. of fiber per serving.  Limit fats to less than 8 tsp (38 g or 1.34 oz.) a day.  Avoid the following:  Foods and beverages sweetened with high-fructose corn syrup, honey, or sugar alcohols such as xylitol, sorbitol, and mannitol.  Foods that contain a lot of fat or sugar.  Fried, greasy, or spicy foods.  High-fiber grains, breads, and cereals.  Raw fruits and vegetables.  Eat foods that are rich in probiotics. These foods include dairy products such as yogurt and fermented milk products. They help increase healthy bacteria in the stomach and intestines (gastrointestinal tract, or GI tract).  If you have lactose intolerance, avoid dairy products. These may make your diarrhea worse.  Take medicine to help stop diarrhea (antidiarrheal medicine) only as told by your health care provider. Replacing nutrients   Eat small meals or snacks every 3-4 hours.  Eat bland foods, such as white rice, toast, or baked potato, until your diarrhea starts to get better. Gradually reintroduce nutrient-rich foods as tolerated or as told by your health care provider. This  includes:  Well-cooked protein foods.  Peeled, seeded, and soft-cooked fruits and vegetables.  Low-fat dairy products.  Take vitamin and mineral supplements as told by your health care provider. Preventing dehydration    Start by sipping water or a special solution to prevent dehydration (oral rehydration solution, ORS). Urine that is clear or pale yellow means that you are getting enough fluid.  Try to drink at least 8-10 cups of fluid each day to help replace lost fluids.  You may add other liquids in addition to water, such as clear juice or decaffeinated sports drinks, as tolerated or as told by your health care provider.  Avoid drinks with caffeine, such as coffee, tea, or soft drinks.  Avoid alcohol. What foods are recommended? The items listed may not be a complete list. Talk with your health care provider about what dietary choices are best for you. Grains  White rice. White, [place], or pita breads (fresh or toasted), including plain rolls, buns, or bagels. White pasta. Saltine, soda, or graham crackers. Pretzels. Low-fiber cereal. Cooked cereals made with water (such as cornmeal, farina, or cream cereals). Plain muffins. Matzo. Melba toast. Zwieback. Vegetables  Potatoes (without the skin). Most well-cooked and canned vegetables without skins or seeds. Tender lettuce. Fruits  Apple sauce. Fruits canned in juice. Cooked apricots, cherries, grapefruit, peaches, pears, or plums. Fresh bananas and cantaloupe. Meats and other protein foods  Baked or boiled chicken. Eggs. Tofu. Fish. Seafood. Smooth nut butters. Ground or well-cooked tender beef, ham, veal, lamb, pork, or poultry. Dairy  Plain yogurt,  kefir, and unsweetened liquid yogurt. Lactose-free milk, buttermilk, skim milk, or soy milk. Low-fat or nonfat hard cheese. Beverages  Water. Low-calorie sports drinks. Fruit juices without pulp. Strained tomato and vegetable juices. Decaffeinated teas. Sugar-free beverages not  sweetened with sugar alcohols. Oral rehydration solutions, if approved by your health care provider. Seasoning and other foods  Bouillon, broth, or soups made from recommended foods. What foods are not recommended? The items listed may not be a complete list. Talk with your health care provider about what dietary choices are best for you. Grains  Whole grain, whole wheat, bran, or rye breads, rolls, pastas, and crackers. Wild or brown rice. Whole grain or bran cereals. Barley. Oats and oatmeal. Corn tortillas or taco shells. Granola. Popcorn. Vegetables  Raw vegetables. Fried vegetables. Cabbage, broccoli, Brussels sprouts, artichokes, baked beans, beet greens, corn, kale, legumes, peas, sweet potatoes, and yams. Potato skins. Cooked spinach and cabbage. Fruits  Dried fruit, including raisins and dates. Raw fruits. Stewed or dried prunes. Canned fruits with syrup. Meat and other protein foods  Fried or fatty meats. Deli meats. Chunky nut butters. Nuts and seeds. Beans and lentils. Tomasa Blase. Hot dogs. Sausage. Dairy  High-fat cheeses. Whole milk, chocolate milk, and beverages made with milk, such as milk shakes. Half-and-half. Cream. sour cream. Ice cream. Beverages  Caffeinated beverages (such as coffee, tea, soda, or energy drinks). Alcoholic beverages. Fruit juices with pulp. Prune juice. Soft drinks sweetened with high-fructose corn syrup or sugar alcohols. High-calorie sports drinks. Fats and oils  Butter. Cream sauces. Margarine. Salad oils. Plain salad dressings. Olives. Avocados. Mayonnaise. Sweets and desserts  Sweet rolls, doughnuts, and sweet breads. Sugar-free desserts sweetened with sugar alcohols such as xylitol and sorbitol. Seasoning and other foods  Honey. Hot sauce. Chili powder. Gravy. Cream-based or milk-based soups. Pancakes and waffles. Summary  When you have diarrhea, the foods you eat and your eating habits are very important.  Make sure you get at least 8-10 cups of  fluid each day, or enough to keep your urine clear or pale yellow.  Eat bland foods and gradually reintroduce healthy, nutrient-rich foods as tolerated, or as told by your health care provider.  Avoid high-fiber, fried, greasy, or spicy foods. This information is not intended to replace advice given to you by your health care provider. Make sure you discuss any questions you have with your health care provider. Document Released: 06/10/2003 Document Revised: 03/17/2016 Document Reviewed: 03/17/2016 Elsevier Interactive Patient Education  2017 [employer].

## 2016-07-21 NOTE — Progress Notes (Signed)
Pre visit review using our clinic review tool, if applicable. No additional management support is needed unless otherwise documented below in the visit note. 

## 2016-07-26 ENCOUNTER — Ambulatory Visit: Payer: Medicare Other | Admitting: Primary Care

## 2016-07-27 DIAGNOSIS — L989 Disorder of the skin and subcutaneous tissue, unspecified: Secondary | ICD-10-CM | POA: Diagnosis not present

## 2016-07-27 DIAGNOSIS — L853 Xerosis cutis: Secondary | ICD-10-CM | POA: Diagnosis not present

## 2016-07-27 DIAGNOSIS — L309 Dermatitis, unspecified: Secondary | ICD-10-CM | POA: Diagnosis not present

## 2016-08-01 ENCOUNTER — Encounter: Payer: Self-pay | Admitting: Family Medicine

## 2016-08-01 ENCOUNTER — Ambulatory Visit (INDEPENDENT_AMBULATORY_CARE_PROVIDER_SITE_OTHER): Payer: Medicare Other | Admitting: Family Medicine

## 2016-08-01 VITALS — BP 140/70 | HR 86 | Temp 97.6°F | Wt 125.5 lb

## 2016-08-01 DIAGNOSIS — I481 Persistent atrial fibrillation: Secondary | ICD-10-CM

## 2016-08-01 DIAGNOSIS — N898 Other specified noninflammatory disorders of vagina: Secondary | ICD-10-CM

## 2016-08-01 DIAGNOSIS — L298 Other pruritus: Secondary | ICD-10-CM | POA: Diagnosis not present

## 2016-08-01 DIAGNOSIS — I4819 Other persistent atrial fibrillation: Secondary | ICD-10-CM

## 2016-08-01 DIAGNOSIS — R55 Syncope and collapse: Secondary | ICD-10-CM | POA: Diagnosis not present

## 2016-08-01 DIAGNOSIS — R300 Dysuria: Secondary | ICD-10-CM

## 2016-08-01 DIAGNOSIS — R296 Repeated falls: Secondary | ICD-10-CM | POA: Diagnosis not present

## 2016-08-01 LAB — POC URINALSYSI DIPSTICK (AUTOMATED)
BILIRUBIN UA: NEGATIVE
Blood, UA: NEGATIVE
GLUCOSE UA: NEGATIVE
KETONES UA: NEGATIVE
LEUKOCYTES UA: NEGATIVE
Nitrite, UA: NEGATIVE
SPEC GRAV UA: 1.015 (ref 1.010–1.025)
Urobilinogen, UA: 0.2 E.U./dL
pH, UA: 6 (ref 5.0–8.0)

## 2016-08-01 NOTE — Progress Notes (Signed)
BP 140/70   Pulse 86   Temp 97.6 F (36.4 C) (Oral)   Wt 125 lb 8 oz (56.9 kg)   BMI 21.54 kg/m    CC: dysuria Subjective:    Patient ID: [name], female    DOB: Aug 31, 1932, 83 y.o.   MRN: [id number]  HPI: [name] is a 83 y.o. female presenting on 08/01/2016 for burning with urination   See prior notes for details. Seen here 06/29/2016 and treated for UTI with keflex 7d course. Ucx grew >100k GBS.  Seen again 07/21/2016 by [name] leading to perianal rawness as well as dysuria. Treated with A&D ointment and cortisone cream. UA at that time was normal. Thomas has resolved. Perianal rawness improved with barrier cream.   Saw skin doctor Dr Hennie Duos derm who placed her on TCI cream and had biopsy done - results pending.   Has large bruise L frontal scalp - states she fell several weeks ago. States she passed out and doesn't remember details. Very unclear story.   Now with 2 wk h/o dysuria and significant vaginal itching "on the inside".  Denies fevers/chills. No abd pain. No nausea/vomiting. No vaginal discharge.   Relevant past medical, surgical, family and social history reviewed and updated as indicated. Interim medical history since our last visit reviewed. Allergies and medications reviewed and updated. Outpatient Medications Prior to Visit  Medication Sig Dispense Refill  . allopurinol (ZYLOPRIM) 100 MG tablet Take 100 mg by mouth daily.    Marland Kitchen aspirin EC 81 MG tablet Take 1 tablet (81 mg total) by mouth daily.    Marland Kitchen atorvastatin (LIPITOR) 40 MG tablet Take 40 mg by mouth daily.    Marland Kitchen DIGOX 125 MCG tablet TAKE ONE TABLET EVERY DAY 30 tablet 6  . FLUoxetine (PROZAC) 20 MG capsule Take 20 mg by mouth daily.    . furosemide (LASIX) 20 MG tablet Take 0.5 tablets (10 mg total) by mouth daily. (Patient taking differently: Take 10 mg by mouth daily as needed for edema. ) 30 tablet 6  . lisinopril (PRINIVIL,ZESTRIL) 5 MG tablet Take 5 mg  by mouth daily.    . metoprolol tartrate (LOPRESSOR) 25 MG tablet TAKE ONE TABLET BY MOUTH TWICE DAILY 60 tablet 3  . Multiple Vitamins-Minerals (ONE-A-DAY WOMENS 50+ ADVANTAGE) TABS Take 1 tablet by mouth daily.     . nitroGLYCERIN (NITROSTAT) 0.4 MG SL tablet Place 0.4 mg under the tongue every 5 (five) minutes as needed for chest pain.    Marland Kitchen nystatin cream (MYCOSTATIN) Apply 1 application topically 2 (two) times daily. 45 g 1   No facility-administered medications prior to visit.      Per HPI unless specifically indicated in ROS section below Review of Systems     Objective:    BP 140/70   Pulse 86   Temp 97.6 F (36.4 C) (Oral)   Wt 125 lb 8 oz (56.9 kg)   BMI 21.54 kg/m   Wt Readings from Last 3 Encounters:  08/01/16 125 lb 8 oz (56.9 kg)  07/21/16 127 lb (57.6 kg)  06/29/16 128 lb 4 oz (58.2 kg)    Physical Exam  Constitutional: She appears well-developed and well-nourished. No distress.  Genitourinary: Pelvic exam was performed with patient supine. There is rash on the right labia. There is no tenderness, lesion or injury on the right labia. There is rash on the left labia. There is no tenderness, lesion or injury on the left labia. No  erythema in the vagina. No vaginal discharge found.  Genitourinary Comments: External vulvar area with thinning skin, erythema, pruritis, possible white patch on right Vaginal atrophy and dryness Wet prep collected although no significant discharge  Musculoskeletal: She exhibits no edema.  Skin: Rash noted. There is erythema.  Pruritic erythematous excoriated ulcerative rash throughout body  Psychiatric:  Tangential  Nursing note and vitals reviewed.  Results for orders placed or performed in visit on 08/01/16  WET PREP BY MOLECULAR PROBE  Result Value Ref Range   Candida species NOT DETECTED NOT DETECTED   Trichomonas vaginosis NOT DETECTED NOT DETECTED   Gardnerella vaginalis NOT DETECTED NOT DETECTED  POCT Urinalysis Dipstick  (Automated)  Result Value Ref Range   Color, UA yellow    Clarity, UA clear    Glucose, UA negative    Bilirubin, UA negative    Ketones, UA negative    Spec Grav, UA 1.015 1.010 - 1.025   Blood, UA negative    pH, UA 6.0 5.0 - 8.0   Protein, UA 1+    Urobilinogen, UA 0.2 0.2 or 1.0 E.U./dL   Nitrite, UA negative    Leukocytes, UA Negative Negative      Assessment & Plan:  RTC for CPE and AMW with [name]. Problem List Items Addressed This Visit    Atrial fibrillation (HCC)    Chronic afib. Has previously refused anticoagulation. Ok to stay off given recurrent falls/?syncope. See above.       Recurrent falls   Syncope and collapse    Has previously seen cardiology and neurology with unrevealing workup. Recurrent unwitnessed episode in the last few weeks. Will update carotid US. Pt not currently on anticoagulation, just on aspirin  daily.       Relevant Orders   VAS US CAROTID   Vaginal itching - Primary    Anticipate symptoms from vaginal atrophy r/o vulvar lichen sclerosus.  Nystatin caused burning discomfort without significant improvement. Discussed trial vaginal estrogen vs topical steroid.  Pt requests GYN referral. Referral placed. Proteinuria on UA but no signs of infection. No abx indicatedaat this time.       Relevant Orders   WET PREP BY MOLECULAR PROBE (Completed)   Ambulatory referral to Gynecology    Other Visit Diagnoses    Burning with urination       Relevant Orders   POCT Urinalysis Dipstick (Automated) (Completed)       Follow up plan: Return if symptoms worsen or fail to improve, for annual exam, prior fasting for blood work, medicare wellness visit.  Sherry Boyden, MD

## 2016-08-01 NOTE — Patient Instructions (Addendum)
Urine looked ok. No signs of infection today. Vaginal area is very atrophic. Try barrier cream to vulvar area and we will refer you to GYN for further evaluation.  For passing out, we will recheck carotid ultrasound.  Return for physical and medicare wellness visit.

## 2016-08-01 NOTE — Progress Notes (Signed)
Pre visit review using our clinic review tool, if applicable. No additional management support is needed unless otherwise documented below in the visit note. 

## 2016-08-02 LAB — WET PREP BY MOLECULAR PROBE
Candida species: NOT DETECTED
Gardnerella vaginalis: NOT DETECTED
Trichomonas vaginosis: NOT DETECTED

## 2016-08-02 NOTE — Assessment & Plan Note (Addendum)
Anticipate symptoms from vaginal atrophy r/o vulvar lichen sclerosus.  Nystatin caused burning discomfort without significant improvement. Discussed trial vaginal estrogen vs topical steroid.  Pt requests GYN referral. Referral placed. Proteinuria on UA but no signs of infection. No abx indicatedaat this time.

## 2016-08-02 NOTE — Assessment & Plan Note (Signed)
Chronic afib. Has previously refused anticoagulation. Ok to stay off given recurrent falls/?syncope. See above.

## 2016-08-02 NOTE — Assessment & Plan Note (Addendum)
Has previously seen cardiology and neurology with unrevealing workup. Recurrent unwitnessed episode in the last few weeks. Will update carotid US. Pt not currently on anticoagulation, just on aspirin  daily.

## 2016-08-03 ENCOUNTER — Encounter: Payer: Self-pay | Admitting: Family Medicine

## 2016-08-04 ENCOUNTER — Other Ambulatory Visit: Payer: Self-pay | Admitting: Family Medicine

## 2016-08-04 NOTE — Telephone Encounter (Signed)
Per 08/01/16 note Nystatin caused burning and itching w/o improvement.  Would you like to refill?

## 2016-08-11 ENCOUNTER — Ambulatory Visit: Payer: Self-pay | Admitting: Obstetrics and Gynecology

## 2016-08-11 ENCOUNTER — Encounter: Payer: Self-pay | Admitting: Obstetrics and Gynecology

## 2016-08-16 ENCOUNTER — Other Ambulatory Visit: Payer: Self-pay | Admitting: Family Medicine

## 2016-08-16 ENCOUNTER — Encounter: Payer: Self-pay | Admitting: Obstetrics and Gynecology

## 2016-08-16 ENCOUNTER — Ambulatory Visit (INDEPENDENT_AMBULATORY_CARE_PROVIDER_SITE_OTHER): Payer: Medicare Other | Admitting: Obstetrics and Gynecology

## 2016-08-16 VITALS — BP 112/62 | HR 64 | Ht 64.0 in | Wt 120.0 lb

## 2016-08-16 DIAGNOSIS — N9489 Other specified conditions associated with female genital organs and menstrual cycle: Secondary | ICD-10-CM

## 2016-08-16 DIAGNOSIS — N952 Postmenopausal atrophic vaginitis: Secondary | ICD-10-CM | POA: Diagnosis not present

## 2016-08-16 DIAGNOSIS — N949 Unspecified condition associated with female genital organs and menstrual cycle: Secondary | ICD-10-CM

## 2016-08-16 MED ORDER — CLOTRIMAZOLE-BETAMETHASONE 1-0.05 % EX CREA: 1.0000 "application " | TOPICAL_CREAM | Freq: Two times a day (BID) | CUTANEOUS | 0 refills | Status: AC

## 2016-08-16 NOTE — Progress Notes (Signed)
Chief Complaint  Patient presents with  . Referral    referral from Dr. Denton Meek [facility] [facility]    HPI:      [name] is a 83 y.o. G1P1001 who LMP was No LMP recorded. Patient is postmenopausal., presents today for NP vaginal burning sx for the past month or so. She saw her PCP for a physical and he wanted a GYN eval. Burning occurs randomly, not only with urination, but pt hasn't had any sx for the past few days. Pt denies any increased d/c, irritation, odor, no VB, urinary leakage. She has not used any Rx meds to treat, but she will sometimes put diaper cream vaginally to help with an "itch". UTI eval neg with PCP.  She uses dove soap.    Patient Active Problem List   Diagnosis Date Noted  . Vaginal itching 08/01/2016  . UTI (urinary tract infection) 07/01/2016  . Cellulitis of finger of right hand 02/04/2016  . Memory impairment 10/04/2015  . Chronic interstitial lung disease (HCC) 08/02/2015  . Advanced care planning/counseling discussion 04/14/2015  . Recurrent falls 04/14/2015  . Right shoulder pain 09/24/2014  . Candidal vulvovaginitis 05/26/2014  . Chronic diastolic CHF (congestive heart failure) (HCC)   . Syncope and collapse 01/01/2014  . Rib pain on left side 10/28/2013  . Lymphocytic colitis 10/28/2013  . Loss of weight 08/18/2013  . Medicare annual wellness visit, initial 06/11/2013  . Depression with anxiety   . Hypothyroidism 06/04/2013  . Atrial fibrillation (HCC)   . Peripheral neuropathy   . Osteopenia   . LBP (low back pain)   . Gout   . Cramping of feet 07/19/2010  . Palpitations 07/19/2010  . EDEMA 01/19/2009  . HLD (hyperlipidemia) 07/02/2008  . Essential hypertension, benign 07/02/2008  . Coronary atherosclerosis 07/02/2008  . Rash and nonspecific skin eruption 07/02/2008    Family History  Problem Relation Age of Onset  . Prostate cancer Father   . Breast cancer Sister 56  . Heart disease Brother   . Heart disease  Other        First degree relatives with heart disease but later onset  . Heart disease Sister        pacer  . Esophageal cancer Neg Hx   . Colon cancer Neg Hx     Social History   Social History  . Marital status: Widowed    Spouse name: N/A  . Number of children: 1  . Years of education: N/A   Occupational History  .  Retired   Social History Main Topics  . Smoking status: Never Smoker  . Smokeless tobacco: Never Used  . Alcohol use Yes     Comment: Occasionally drinks beer  . Drug use: No  . Sexual activity: Not on file   Other Topics Concern  . 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  allopurinol (ZYLOPRIM) 100 MG tablet, Take 100 mg by mouth daily., Disp: , Rfl:  .  aspirin EC 81 MG tablet, Take 1 tablet (81 mg total) by mouth daily., Disp: , Rfl:  .  atorvastatin (LIPITOR) 40 MG tablet, Take 40 mg by mouth daily., Disp: , Rfl:  .  clotrimazole-betamethasone (LOTRISONE) cream, Apply 1 application topically 2 (two)  times daily. Apply externally BID for 2 wks, Disp: 15 g, Rfl: 0 .  DIGOX 125 MCG tablet, TAKE ONE TABLET EVERY DAY, Disp: 30 tablet, Rfl: 6 .  FLUoxetine (PROZAC) 20 MG capsule, Take 20 mg by mouth daily., Disp: , Rfl:  .  furosemide (LASIX) 20 MG tablet, Take 0.5 tablets (10 mg total) by mouth daily. (Patient taking differently: Take 10 mg by mouth daily as needed for edema. ), Disp: 30 tablet, Rfl: 6 .  lisinopril (PRINIVIL,ZESTRIL) 5 MG tablet, Take 5 mg by mouth daily., Disp: , Rfl:  .  metoprolol tartrate (LOPRESSOR) 25 MG tablet, TAKE ONE TABLET BY MOUTH TWICE DAILY, Disp: 60 tablet, Rfl: 3 .  Multiple Vitamins-Minerals (ONE-A-DAY WOMENS 50+ ADVANTAGE) TABS, Take 1 tablet by mouth daily. , Disp: , Rfl:  .  nitroGLYCERIN (NITROSTAT) 0.4 MG SL tablet, Place 0.4 mg under  the tongue every 5 (five) minutes as needed for chest pain., Disp: , Rfl:  .  nystatin cream (MYCOSTATIN), APPLY 1 APPLICATION TOPICALLY TWICE DAILY, Disp: 30 g, Rfl: 0  Review of Systems  Constitutional: Negative for fever.  Gastrointestinal: Positive for constipation. Negative for blood in stool, diarrhea, nausea and vomiting.  Genitourinary: Positive for dysuria. Negative for dyspareunia, flank pain, frequency, hematuria, urgency, vaginal bleeding, vaginal discharge and vaginal pain.  Musculoskeletal: Positive for arthralgias. Negative for back pain.  Skin: Negative for rash.  Neurological: Positive for syncope.  Psychiatric/Behavioral: The patient is nervous/anxious.      OBJECTIVE:   Vitals:  BP 112/62   Pulse 64   Ht 5\' 4"  (1.626 m)   Wt 120 lb (54.4 kg)   BMI 20.60 kg/m   Physical Exam  Constitutional: She is well-developed, well-nourished, and in no distress.  Genitourinary: Vulva exhibits no erythema, no exudate, no rash and no tenderness.  Genitourinary Comments: EXT VULVA WITH PALE, HYPOPIGMENTED TISSUE DIFFUSELY; MILDLY HYPERTROPHIED IN CREASES, INCLUDING PERINEAL AREA; NO LESIONS; PT STATES BURNING SENSATION IS BILAT LABIA MINORA NEAR INTROITUS  Vitals reviewed.    Assessment/Plan:  Vaginal burning - Question fungal vs atrophy. Will try Rx clotrimazole/betamethasone crm BID for 2 wks. RTO in 3 wks for f/u. If still sx, will try ext vag ERT for a short course - Plan: clotrimazole-betamethasone (LOTRISONE) cream  Atrophic vaginitis    Return in about 3 weeks (around 09/06/2016) for vaginitis f/u.  Alicia B. Copland, PA-C 08/16/2016 2:06 PM

## 2016-08-18 ENCOUNTER — Ambulatory Visit: Payer: Medicare Other

## 2016-08-18 DIAGNOSIS — R55 Syncope and collapse: Secondary | ICD-10-CM

## 2016-08-18 DIAGNOSIS — I6523 Occlusion and stenosis of bilateral carotid arteries: Secondary | ICD-10-CM | POA: Diagnosis not present

## 2016-08-18 LAB — VAS US CAROTID
LCCADDIAS: -9 cm/s
LCCAPSYS: 67 cm/s
LEFT ECA DIAS: 0 cm/s
LICADDIAS: 0 cm/s
LICAPDIAS: 0 cm/s
LICAPSYS: 54 cm/s
Left CCA dist sys: -64 cm/s
Left CCA prox dias: 0 cm/s
Left ICA dist sys: -109 cm/s
RCCAPSYS: 56 cm/s
RIGHT VERTEBRAL DIAS: -13 cm/s
Right cca dist sys: -69 cm/s

## 2016-08-19 ENCOUNTER — Encounter: Payer: Self-pay | Admitting: Family Medicine

## 2016-08-19 DIAGNOSIS — I6523 Occlusion and stenosis of bilateral carotid arteries: Secondary | ICD-10-CM | POA: Insufficient documentation

## 2016-08-21 ENCOUNTER — Telehealth: Payer: Self-pay | Admitting: Family Medicine

## 2016-08-21 NOTE — Telephone Encounter (Signed)
Left pt message on home and son cell phone asking to call Revonda Standardllison back directly at [phone] to schedule AWV.+ labs with Virl AxeLesia and CPE with PCP.

## 2016-08-23 DIAGNOSIS — M25512 Pain in left shoulder: Secondary | ICD-10-CM | POA: Diagnosis not present

## 2016-08-23 DIAGNOSIS — G894 Chronic pain syndrome: Secondary | ICD-10-CM | POA: Diagnosis not present

## 2016-08-23 DIAGNOSIS — Z79891 Long term (current) use of opiate analgesic: Secondary | ICD-10-CM | POA: Diagnosis not present

## 2016-08-23 DIAGNOSIS — M4727 Other spondylosis with radiculopathy, lumbosacral region: Secondary | ICD-10-CM | POA: Diagnosis not present

## 2016-08-23 DIAGNOSIS — M79641 Pain in right hand: Secondary | ICD-10-CM | POA: Diagnosis not present

## 2016-08-23 DIAGNOSIS — M961 Postlaminectomy syndrome, not elsewhere classified: Secondary | ICD-10-CM | POA: Diagnosis not present

## 2016-08-29 ENCOUNTER — Other Ambulatory Visit: Payer: Self-pay | Admitting: *Deleted

## 2016-08-29 ENCOUNTER — Ambulatory Visit (INDEPENDENT_AMBULATORY_CARE_PROVIDER_SITE_OTHER): Payer: Medicare Other | Admitting: Family Medicine

## 2016-08-29 ENCOUNTER — Encounter: Payer: Self-pay | Admitting: Family Medicine

## 2016-08-29 VITALS — BP 142/56 | HR 62 | Temp 97.5°F | Ht 64.0 in | Wt 117.2 lb

## 2016-08-29 DIAGNOSIS — R413 Other amnesia: Secondary | ICD-10-CM | POA: Diagnosis not present

## 2016-08-29 DIAGNOSIS — K52832 Lymphocytic colitis: Secondary | ICD-10-CM

## 2016-08-29 DIAGNOSIS — E785 Hyperlipidemia, unspecified: Secondary | ICD-10-CM

## 2016-08-29 DIAGNOSIS — N898 Other specified noninflammatory disorders of vagina: Secondary | ICD-10-CM

## 2016-08-29 DIAGNOSIS — Z7189 Other specified counseling: Secondary | ICD-10-CM

## 2016-08-29 DIAGNOSIS — E039 Hypothyroidism, unspecified: Secondary | ICD-10-CM | POA: Diagnosis not present

## 2016-08-29 DIAGNOSIS — Z0000 Encounter for general adult medical examination without abnormal findings: Secondary | ICD-10-CM | POA: Diagnosis not present

## 2016-08-29 DIAGNOSIS — F418 Other specified anxiety disorders: Secondary | ICD-10-CM

## 2016-08-29 DIAGNOSIS — I10 Essential (primary) hypertension: Secondary | ICD-10-CM

## 2016-08-29 DIAGNOSIS — I4819 Other persistent atrial fibrillation: Secondary | ICD-10-CM

## 2016-08-29 DIAGNOSIS — R21 Rash and other nonspecific skin eruption: Secondary | ICD-10-CM

## 2016-08-29 DIAGNOSIS — I5032 Chronic diastolic (congestive) heart failure: Secondary | ICD-10-CM

## 2016-08-29 LAB — LIPID PANEL
CHOL/HDL RATIO: 2
Cholesterol: 193 mg/dL (ref 0–200)
HDL: 79.8 mg/dL (ref >39.00)
LDL Cholesterol: 96 mg/dL (ref 0–99)
NONHDL: 113.58
Triglycerides: 89 mg/dL (ref 0.0–149.0)
VLDL: 17.8 mg/dL (ref 0.0–40.0)

## 2016-08-29 LAB — TSH: TSH: 4.17 u[IU]/mL (ref 0.35–4.50)

## 2016-08-29 MED ORDER — ALLOPURINOL 100 MG PO TABS: 100.0000 mg | ORAL_TABLET | Freq: Every day | ORAL | 5 refills | Status: DC

## 2016-08-29 NOTE — Assessment & Plan Note (Signed)

## 2016-08-29 NOTE — Assessment & Plan Note (Signed)
Advanced directives: discussed, doesn't want prolonged life support. "I want to die natural death". Would want son to be HCPOA despite his mental health issues. Has living will at home, asked to bring us a copy.

## 2016-08-29 NOTE — Assessment & Plan Note (Signed)
Seems euvolemic today.  

## 2016-08-29 NOTE — Assessment & Plan Note (Signed)
Stable period. Denies ongoing diarrhea.  She never f/u with GI.

## 2016-08-29 NOTE — Assessment & Plan Note (Signed)
Chronic, stable. Continue current regimen. 

## 2016-08-29 NOTE — Assessment & Plan Note (Signed)
Chronic, stable. Update labs.  

## 2016-08-29 NOTE — Assessment & Plan Note (Signed)
Failed recall , trouble with calculation. rec geriatric assessment at 69mo f/u visit.

## 2016-08-29 NOTE — Assessment & Plan Note (Signed)
Appreciate GYN care.  

## 2016-08-29 NOTE — Assessment & Plan Note (Signed)
Preventative protocols reviewed and updated unless pt declined. Discussed healthy diet and lifestyle.  

## 2016-08-29 NOTE — Assessment & Plan Note (Addendum)
Continue prozac. Update PHQ9 today = 18/27.

## 2016-08-29 NOTE — Assessment & Plan Note (Signed)
Update labs. Continue atorvastatin.

## 2016-08-29 NOTE — Patient Instructions (Addendum)
Call and schedule mammogram as you're due. [facility] [phone] Bring [place] copy of your living will/advanced directive.  Use cane.  Increase water intake.  Use permethrin cream again if it was helpful - should have a refill.  Return in 3 months for follow up visit.   Health Maintenance, Female Adopting a healthy lifestyle and getting preventive care can go a long way to promote health and wellness. Talk with your health care provider about what schedule of regular examinations is right for you. This is a good chance for you to check in with your provider about disease prevention and staying healthy. In between checkups, there are plenty of things you can do on your own. Experts have done a lot of research about which lifestyle changes and preventive measures are most likely to keep you healthy. Ask your health care provider for more information. Weight and diet Eat a healthy diet  Be sure to include plenty of vegetables, fruits, low-fat dairy products, and lean protein.  Do not eat a lot of foods high in solid fats, added sugars, or salt.  Get regular exercise. This is one of the most important things you can do for your health.  Most adults should exercise for at least 150 minutes each week. The exercise should increase your heart rate and make you sweat (moderate-intensity exercise).  Most adults should also do strengthening exercises at least twice a week. This is in addition to the moderate-intensity exercise. Maintain a healthy weight  Body mass index (BMI) is a measurement that can be used to identify possible weight problems. It estimates body fat based on height and weight. Your health care provider can help determine your BMI and help you achieve or maintain a healthy weight.  For females 78 years of age and older:  A BMI below 18.5 is considered underweight.  A BMI of 18.5 to 24.9 is normal.  A BMI of 25 to 29.9 is considered overweight.  A BMI of 30 and  above is considered obese. Watch levels of cholesterol and blood lipids  You should start having your blood tested for lipids and cholesterol at 81 years of age, then have this test every 5 years.  You may need to have your cholesterol levels checked more often if:  Your lipid or cholesterol levels are high.  You are older than 81 years of age.  You are at high risk for heart disease. Cancer screening Lung Cancer  Lung cancer screening is recommended for adults 68-62 years old who are at high risk for lung cancer because of a history of smoking.  A yearly low-dose CT scan of the lungs is recommended for people who:  Currently smoke.  Have quit within the past 15 years.  Have at least a 30-pack-year history of smoking. A pack year is smoking an average of one pack of cigarettes a day for 1 year.  Yearly screening should continue until it has been 15 years since you quit.  Yearly screening should stop if you develop a health problem that would prevent you from having lung cancer treatment. Breast Cancer  Practice breast self-awareness. This means understanding how your breasts normally appear and feel.  It also means doing regular breast self-exams. Let your health care provider know about any changes, no matter how small.  If you are in your 20s or 30s, you should have a clinical breast exam (CBE) by a health care provider every 1-3 years as part of a regular health  exam.  If you are 40 or older, have a CBE every year. Also consider having a breast X-ray (mammogram) every year.  If you have a family history of breast cancer, talk to your health care provider about genetic screening.  If you are at high risk for breast cancer, talk to your health care provider about having an MRI and a mammogram every year.  Breast cancer gene (BRCA) assessment is recommended for women who have family members with BRCA-related cancers. BRCA-related cancers  include:  Breast.  Ovarian.  Tubal.  Peritoneal cancers.  Results of the assessment will determine the need for genetic counseling and BRCA1 and BRCA2 testing. Cervical Cancer  Your health care provider may recommend that you be screened regularly for cancer of the pelvic organs (ovaries, uterus, and vagina). This screening involves a pelvic examination, including checking for microscopic changes to the surface of your cervix (Pap test). You may be encouraged to have this screening done every 3 years, beginning at age 42.  For women ages 9-65, health care providers may recommend pelvic exams and Pap testing every 3 years, or they may recommend the Pap and pelvic exam, combined with testing for human papilloma virus (HPV), every 5 years. Some types of HPV increase your risk of cervical cancer. Testing for HPV may also be done on women of any age with unclear Pap test results.  Other health care providers may not recommend any screening for nonpregnant women who are considered low risk for pelvic cancer and who do not have symptoms. Ask your health care provider if a screening pelvic exam is right for you.  If you have had past treatment for cervical cancer or a condition that could lead to cancer, you need Pap tests and screening for cancer for at least 20 years after your treatment. If Pap tests have been discontinued, your risk factors (such as having a new sexual partner) need to be reassessed to determine if screening should resume. Some women have medical problems that increase the chance of getting cervical cancer. In these cases, your health care provider may recommend more frequent screening and Pap tests. Colorectal Cancer  This type of cancer can be detected and often prevented.  Routine colorectal cancer screening usually begins at 81 years of age and continues through 81 years of age.  Your health care provider may recommend screening at an earlier age if you have risk factors  for colon cancer.  Your health care provider may also recommend using home test kits to check for hidden blood in the stool.  A small camera at the end of a tube can be used to examine your colon directly (sigmoidoscopy or colonoscopy). This is done to check for the earliest forms of colorectal cancer.  Routine screening usually begins at age 15.  Direct examination of the colon should be repeated every 5-10 years through 81 years of age. However, you may need to be screened more often if early forms of precancerous polyps or small growths are found. Skin Cancer  Check your skin from head to toe regularly.  Tell your health care provider about any new moles or changes in moles, especially if there is a change in a mole's shape or color.  Also tell your health care provider if you have a mole that is larger than the size of a pencil eraser.  Always use sunscreen. Apply sunscreen liberally and repeatedly throughout the day.  Protect yourself by wearing long sleeves, pants, a wide-brimmed hat, and  sunglasses whenever you are outside. Heart disease, diabetes, and high blood pressure  High blood pressure causes heart disease and increases the risk of stroke. High blood pressure is more likely to develop in:  People who have blood pressure in the high end of the normal range (130-139/85-89 mm Hg).  People who are overweight or obese.  People who are African American.  If you are 71-12 years of age, have your blood pressure checked every 3-5 years. If you are 23 years of age or older, have your blood pressure checked every year. You should have your blood pressure measured twice-once when you are at a hospital or clinic, and once when you are not at a hospital or clinic. Record the average of the two measurements. To check your blood pressure when you are not at a hospital or clinic, you can use:  An automated blood pressure machine at a pharmacy.  A home blood pressure monitor.  If you  are between 102 years and 3 years old, ask your health care provider if you should take aspirin to prevent strokes.  Have regular diabetes screenings. This involves taking a blood sample to check your fasting blood sugar level.  If you are at a normal weight and have a low risk for diabetes, have this test once every three years after 81 years of age.  If you are overweight and have a high risk for diabetes, consider being tested at a younger age or more often. Preventing infection Hepatitis B  If you have a higher risk for hepatitis B, you should be screened for this virus. You are considered at high risk for hepatitis B if:  You were born in a country where hepatitis B is common. Ask your health care provider which countries are considered high risk.  Your parents were born in a high-risk country, and you have not been immunized against hepatitis B (hepatitis B vaccine).  You have HIV or AIDS.  You use needles to inject street drugs.  You live with someone who has hepatitis B.  You have had sex with someone who has hepatitis B.  You get hemodialysis treatment.  You take certain medicines for conditions, including cancer, organ transplantation, and autoimmune conditions. Hepatitis C  Blood testing is recommended for:  Everyone born from [birth year] through 1963.  Anyone with known risk factors for hepatitis C. Sexually transmitted infections (STIs)  You should be screened for sexually transmitted infections (STIs) including gonorrhea and chlamydia if:  You are sexually active and are younger than 81 years of age.  You are older than 81 years of age and your health care provider tells you that you are at risk for this type of infection.  Your sexual activity has changed since you were last screened and you are at an increased risk for chlamydia or gonorrhea. Ask your health care provider if you are at risk.  If you do not have HIV, but are at risk, it may be recommended that you  take a prescription medicine daily to prevent HIV infection. This is called pre-exposure prophylaxis (PrEP). You are considered at risk if:  You are sexually active and do not regularly use condoms or know the HIV status of your partner(s).  You take drugs by injection.  You are sexually active with a partner who has HIV. Talk with your health care provider about whether you are at high risk of being infected with HIV. If you choose to begin PrEP, you should first be tested  for HIV. You should then be tested every 3 months for as long as you are taking PrEP. Pregnancy  If you are premenopausal and you may become pregnant, ask your health care provider about preconception counseling.  If you may become pregnant, take 400 to 800 micrograms (mcg) of folic acid every day.  If you want to prevent pregnancy, talk to your health care provider about birth control (contraception). Osteoporosis and menopause  Osteoporosis is a disease in which the bones lose minerals and strength with aging. This can result in serious bone fractures. Your risk for osteoporosis can be identified using a bone density scan.  If you are 63 years of age or older, or if you are at risk for osteoporosis and fractures, ask your health care provider if you should be screened.  Ask your health care provider whether you should take a calcium or vitamin D supplement to lower your risk for osteoporosis.  Menopause may have certain physical symptoms and risks.  Hormone replacement therapy may reduce some of these symptoms and risks. Talk to your health care provider about whether hormone replacement therapy is right for you. Follow these instructions at home:  Schedule regular health, dental, and eye exams.  Stay current with your immunizations.  Do not use any tobacco products including cigarettes, chewing tobacco, or electronic cigarettes.  If you are pregnant, do not drink alcohol.  If you are breastfeeding, limit  how much and how often you drink alcohol.  Limit alcohol intake to no more than 1 drink per day for nonpregnant women. One drink equals 12 ounces of beer, 5 ounces of wine, or 1 ounces of hard liquor.  Do not use street drugs.  Do not share needles.  Ask your health care provider for help if you need support or information about quitting drugs.  Tell your health care provider if you often feel depressed.  Tell your health care provider if you have ever been abused or do not feel safe at home. This information is not intended to replace advice given to you by your health care provider. Make sure you discuss any questions you have with your health care provider. Document Released: 10/03/2010 Document Revised: 08/26/2015 Document Reviewed: 12/22/2014 Elsevier Interactive Patient Education  2017 [employer].

## 2016-08-29 NOTE — Progress Notes (Addendum)
BP (!) 142/56   Pulse 62   Temp 97.5 F (36.4 C) (Oral)   Ht 5\' 4"  (1.626 m)   Wt 117 lb 4 oz (53.2 kg)   SpO2 98%   BMI 20.13 kg/m    CC: AMW Subjective:    Patient ID: [name], female    DOB: 09/02/1932, 83 y.o.   MRN: [id number]  HPI: [name] is a 83 y.o. female presenting on 08/29/2016 for Annual Exam   Saw Dr Patsy [facility] for vaginal burning - fungal vs atrophy. Treated with lotrisone, planned f/u next week. Appreciate GYN care.   Skin rash - ongoing. permethrin did help some.   Failed fall risk screen Failed depression screen  Preventative: COLONOSCOPY WITH PROPOFOL lymphocytic colitis 10/16/2013 Rachael Feeaniel P Jacobs, MD incomplete response to prednisone. Thought unreliable historian on last eval with GI 01/2014. Cervical cancer screening - has aged out.  Mammogram - 08/2015 BiRads1 DEXA - 07/2013 T -2.2 L femur Flu shot yearly Pneumovax 02/2013, prevnar 2017 Tetanus - 02/2016 Shingles shot - discussed, declines  Advanced directives: discussed, doesn't want prolonged life support. "I want to die natural death". Would want son to be HCPOA despite his mental health issues. Has living will at home, asked to bring us a copy.  Seat belt use discussed Sunscreen use discussed. No changing moles on skin.  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 Interim medical history since our last visit reviewed. Allergies and medications reviewed and updated. Outpatient Medications Prior to Visit  Medication Sig Dispense Refill  . allopurinol (ZYLOPRIM) 100 MG tablet Take 100 mg by mouth daily.    Marland Kitchen. aspirin EC 81 MG tablet Take 1 tablet (81 mg total) by mouth daily.    Marland Kitchen. atorvastatin (LIPITOR) 40 MG tablet Take 40 mg by mouth daily.    .  clotrimazole-betamethasone (LOTRISONE) cream Apply 1 application topically 2 (two) times daily. Apply externally BID for 2 wks 15 g 0  . DIGOX 125 MCG tablet TAKE ONE TABLET EVERY DAY 30 tablet 6  . FLUoxetine (PROZAC) 20 MG capsule Take 20 mg by mouth daily.    . furosemide (LASIX) 20 MG tablet Take 0.5 tablets (10 mg total) by mouth daily. (Patient taking differently: Take 10 mg by mouth daily as needed for edema. ) 30 tablet 6  . lisinopril (PRINIVIL,ZESTRIL) 5 MG tablet Take 5 mg by mouth daily.    . metoprolol tartrate (LOPRESSOR) 25 MG tablet TAKE ONE TABLET BY MOUTH TWICE DAILY 60 tablet 3  . Multiple Vitamins-Minerals (ONE-A-DAY WOMENS 50+ ADVANTAGE) TABS Take 1 tablet by mouth daily.     . nitroGLYCERIN (NITROSTAT) 0.4 MG SL tablet Place 0.4 mg under the tongue every 5 (five) minutes as needed for chest pain.    Marland Kitchen. nystatin cream (MYCOSTATIN) APPLY TOPICALLY TWICE A DAY 30 g 1   No facility-administered medications prior to visit.      Per HPI unless specifically indicated in ROS section below Review of Systems  Constitutional: Positive for appetite change (no appetite). Negative for activity change, chills, fatigue, fever and unexpected weight change.  HENT: Negative for hearing loss.   Eyes: Negative for visual disturbance.  Respiratory: Negative for cough, chest tightness, shortness of breath and wheezing.   Cardiovascular: Negative for  chest pain, palpitations and leg swelling.  Gastrointestinal: Positive for constipation. Negative for abdominal distention, abdominal pain, blood in stool, diarrhea, nausea and vomiting.  Genitourinary: Negative for difficulty urinating and hematuria.  Musculoskeletal: Negative for arthralgias, myalgias and neck pain.  Skin: Negative for rash.  Neurological: Positive for dizziness and syncope. Negative for seizures and headaches.  Hematological: Negative for adenopathy. Does not bruise/bleed easily.  Psychiatric/Behavioral: Positive for dysphoric  mood. The patient is not nervous/anxious.        Objective:    BP (!) 142/56   Pulse 62   Temp 97.5 F (36.4 C) (Oral)   Ht 5\' 4"  (1.626 m)   Wt 117 lb 4 oz (53.2 kg)   SpO2 98%   BMI 20.13 kg/m   Wt Readings from Last 3 Encounters:  08/29/16 117 lb 4 oz (53.2 kg)  08/16/16 120 lb (54.4 kg)  08/01/16 125 lb 8 oz (56.9 kg)    Physical Exam  Constitutional: She is oriented to person, place, and time. She appears well-developed and well-nourished. No distress.  HENT:  Head: Normocephalic and atraumatic.  Right Ear: Hearing, tympanic membrane, external ear and ear canal normal.  Left Ear: Hearing, tympanic membrane, external ear and ear canal normal.  Nose: Nose normal.  Mouth/Throat: Uvula is midline, oropharynx is clear and moist and mucous membranes are normal. No oropharyngeal exudate, posterior oropharyngeal edema or posterior oropharyngeal erythema.  Eyes: Conjunctivae and EOM are normal. Pupils are equal, round, and reactive to light. No scleral icterus.  Neck: Normal range of motion. Neck supple. No thyromegaly present.  Cardiovascular: Normal rate, regular rhythm, normal heart sounds and intact distal pulses.   No murmur heard. Pulses:      Radial pulses are 2+ on the right side, and 2+ on the left side.  Pulmonary/Chest: Effort normal and breath sounds normal. No respiratory distress. She has no wheezes. She has no rales.  Abdominal: Soft. Bowel sounds are normal. She exhibits no distension and no mass. There is no tenderness. There is no rebound and no guarding.  Musculoskeletal: Normal range of motion. She exhibits no edema.  Lymphadenopathy:    She has no cervical adenopathy.  Neurological: She is alert and oriented to person, place, and time.  CN grossly intact, station and gait intact Recall 0/3, 3/3 with cue Calculation 3/5 serial 3s  Cannot recall president's name.  Skin: Skin is warm and dry. Rash noted.  Erythematous pruritic erosions throughout skin  -extremities and trunk, spares face. L leg improved, persistent rash RLE.  Psychiatric: She has a normal mood and affect. Her behavior is normal. Judgment and thought content normal.  Nursing note and vitals reviewed.  Lab Results  Component Value Date   CHOL 163 04/06/2015   HDL 55.70 04/06/2015   LDLCALC 85 04/06/2015   LDLDIRECT 157.2 02/04/2013   TRIG 113.0 04/06/2015   CHOLHDL 3 04/06/2015    Lab Results  Component Value Date   TSH 3.77 04/06/2015       Assessment & Plan:   Problem List Items Addressed This Visit    Advanced care planning/counseling discussion    Advanced directives: discussed, doesn't want prolonged life support. "I want to die natural death". Would want son to be HCPOA despite his mental health issues. Has living will at home, asked to bring [place] a copy.       Atrial fibrillation (HCC)    Declines anticoagulation.       Chronic diastolic CHF (congestive heart failure) (HCC)  Seems euvolemic today.       Depression with anxiety    Continue prozac. Update PHQ9 today = 18/27.       Essential hypertension, benign    Chronic, stable. Continue current regimen.       Health maintenance examination    Preventative protocols reviewed and updated unless pt declined. Discussed healthy diet and lifestyle.       HLD (hyperlipidemia)    Update labs. Continue atorvastatin.       Relevant Orders   Lipid panel   Hypothyroidism    Chronic, stable. Update labs.       Relevant Orders   TSH   Lymphocytic colitis    Stable period. Denies ongoing diarrhea.  She never f/u with GI.       Medicare annual wellness visit, subsequent - Primary    I have personally reviewed the Medicare Annual Wellness questionnaire and have noted 1. The patient's medical and social history 2. Their use of alcohol, tobacco or illicit drugs 3. Their current medications and supplements 4. The patient's functional ability including ADL's, fall risks, home safety risks and  hearing or visual impairment. Cognitive function has been assessed and addressed as indicated.  5. Diet and physical activity 6. Evidence for depression or mood disorders The patients weight, height, BMI have been recorded in the chart. I have made referrals, counseling and provided education to the patient based on review of the above and I have provided the pt with a written personalized care plan for preventive services. Provider list updated.. See scanned questionairre as needed for further documentation. Reviewed preventative protocols and updated unless pt declined.       Memory impairment    Failed recall , trouble with calculation. rec geriatric assessment at 57mo f/u visit.       Rash and nonspecific skin eruption    Ongoing. Has seen derm.  She states permethrin cream was helpful - ?scabies. This cream had a refill. I encouraged her to do another treatment and if no better, f/u with derm.       Vaginal itching    Appreciate GYN care.           Follow up plan: Return in about 3 months (around 11/29/2016) for follow up visit.  Eustaquio Boyden, MD

## 2016-08-29 NOTE — Assessment & Plan Note (Signed)
Ongoing. Has seen derm.  She states permethrin cream was helpful - ?scabies. This cream had a refill. I encouraged her to do another treatment and if no better, f/u with derm.

## 2016-08-29 NOTE — Assessment & Plan Note (Signed)
Declines anticoagulation

## 2016-08-30 ENCOUNTER — Encounter: Payer: Self-pay | Admitting: *Deleted

## 2016-08-31 ENCOUNTER — Telehealth: Payer: Self-pay | Admitting: Family Medicine

## 2016-08-31 DIAGNOSIS — L309 Dermatitis, unspecified: Secondary | ICD-10-CM | POA: Diagnosis not present

## 2016-08-31 DIAGNOSIS — S50911A Unspecified superficial injury of right forearm, initial encounter: Secondary | ICD-10-CM | POA: Diagnosis not present

## 2016-08-31 NOTE — Telephone Encounter (Signed)
Received call from Wynelle BeckmannBrenda Sandridge PA about [name] and her ongoing rash. Biopsy consistent with excoration, has been treated for scabies as well, latest rpt biopsy pending, treated with prednisone taper today. Brings up concern about oxycodone prescribed by Dr Vear ClockPhillips pain management - pt states she has been receiving #120 oxycodone per month, but pt states she rarely uses (<4 per month). Concern this is being diverted. I was unaware patient was continuing to receive oxycodone. Steward DroneBrenda will call pain management to notify them of this concern. I will discuss this concern with patient at next office visit.

## 2016-09-06 ENCOUNTER — Ambulatory Visit (INDEPENDENT_AMBULATORY_CARE_PROVIDER_SITE_OTHER): Payer: Medicare Other | Admitting: Obstetrics and Gynecology

## 2016-09-06 ENCOUNTER — Encounter: Payer: Self-pay | Admitting: Obstetrics and Gynecology

## 2016-09-06 VITALS — BP 120/60 | HR 54 | Ht 64.0 in | Wt 124.0 lb

## 2016-09-06 DIAGNOSIS — N949 Unspecified condition associated with female genital organs and menstrual cycle: Secondary | ICD-10-CM | POA: Diagnosis not present

## 2016-09-06 NOTE — Progress Notes (Signed)
Chief Complaint  Patient presents with  . Follow-up    3 week follow up    HPI:      [name] is a 83 y.o. G1P1001 who LMP was No LMP recorded. Patient is postmenopausal., presents today for vaginal burning f/u from 3 wks ago. Pt's exam was c/w either fungal etiology vs atrophic vaginitis. Pt given clotrimazole/betamethason crm to use ext BID for 2 wks. She states sx have resolved and she no longer has any burning or vaginal sx.   08/16/16 NOTE:  Presented for vaginal burning sx for the past month or so. She saw her PCP for a physical and he wanted a GYN eval. Burning occurs randomly, not only with urination, but pt hasn't had any sx for the past few days. Pt denies any increased d/c, irritation, odor, no VB, urinary leakage. She has not used any Rx meds to treat, but she will sometimes put diaper cream vaginally to help with an "itch". UTI eval neg with PCP.  She uses dove soap.    Patient Active Problem List   Diagnosis Date Noted  . Health maintenance examination 08/29/2016  . Carotid stenosis, bilateral 08/19/2016  . Vaginal itching 08/01/2016  . UTI (urinary tract infection) 07/01/2016  . Cellulitis of finger of right hand 02/04/2016  . Memory impairment 10/04/2015  . Chronic interstitial lung disease (HCC) 08/02/2015  . Advanced care planning/counseling discussion 04/14/2015  . Recurrent falls 04/14/2015  . Right shoulder pain 09/24/2014  . Candidal vulvovaginitis 05/26/2014  . Chronic diastolic CHF (congestive heart failure) (HCC)   . Syncope and collapse 01/01/2014  . Rib pain on left side 10/28/2013  . Lymphocytic colitis 10/28/2013  . Loss of weight 08/18/2013  . Medicare annual wellness visit, subsequent 06/11/2013  . Depression with anxiety   . Hypothyroidism 06/04/2013  . Atrial fibrillation (HCC)   . Peripheral neuropathy   . Osteopenia   . LBP (low back pain)   . Gout   . Cramping of feet 07/19/2010  . Palpitations 07/19/2010  . EDEMA 01/19/2009   . HLD (hyperlipidemia) 07/02/2008  . Essential hypertension, benign 07/02/2008  . Coronary atherosclerosis 07/02/2008  . Rash and nonspecific skin eruption 07/02/2008    Family History  Problem Relation Age of Onset  . Prostate cancer Father   . Breast cancer Sister 38  . Heart disease Brother   . Heart disease Other        First degree relatives with heart disease but later onset  . Heart disease Sister        pacer  . Esophageal cancer Neg Hx   . Colon cancer Neg Hx     Social History   Social History  . Marital status: Widowed    Spouse name: N/A  . Number of children: 1  . Years of education: N/A   Occupational History  .  Retired   Social History Main Topics  . Smoking status: Never Smoker  . Smokeless tobacco: Never Used  . Alcohol use Yes     Comment: Occasionally drinks beer  . Drug use: No  . Sexual activity: Not on file   Other Topics Concern  . 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  allopurinol (ZYLOPRIM) 100 MG tablet, Take  1 tablet (100 mg total) by mouth daily., Disp: 30 tablet, Rfl: 5 .  aspirin EC 81 MG tablet, Take 1 tablet (81 mg total) by mouth daily., Disp: , Rfl:  .  atorvastatin (LIPITOR) 40 MG tablet, Take 40 mg by mouth daily., Disp: , Rfl:  .  clotrimazole-betamethasone (LOTRISONE) cream, Apply 1 application topically 2 (two) times daily. Apply externally BID for 2 wks, Disp: 15 g, Rfl: 0 .  DIGOX 125 MCG tablet, TAKE ONE TABLET EVERY DAY, Disp: 30 tablet, Rfl: 6 .  FLUoxetine (PROZAC) 20 MG capsule, Take 20 mg by mouth daily., Disp: , Rfl:  .  furosemide (LASIX) 20 MG tablet, Take 0.5 tablets (10 mg total) by mouth daily. (Patient taking differently: Take 10 mg by mouth daily as needed for edema. ), Disp: 30 tablet, Rfl: 6 .  lisinopril  (PRINIVIL,ZESTRIL) 5 MG tablet, Take 5 mg by mouth daily., Disp: , Rfl:  .  metoprolol tartrate (LOPRESSOR) 25 MG tablet, TAKE ONE TABLET BY MOUTH TWICE DAILY, Disp: 60 tablet, Rfl: 3 .  Multiple Vitamins-Minerals (ONE-A-DAY WOMENS 50+ ADVANTAGE) TABS, Take 1 tablet by mouth daily. , Disp: , Rfl:  .  nitroGLYCERIN (NITROSTAT) 0.4 MG SL tablet, Place 0.4 mg under the tongue every 5 (five) minutes as needed for chest pain., Disp: , Rfl:  .  nystatin cream (MYCOSTATIN), APPLY TOPICALLY TWICE A DAY, Disp: 30 g, Rfl: 1 .  Oxycodone HCl 10 MG TABS, Take 1 tablet (10 mg total) by mouth as directed. Pain management Dr Vear ClockPhillips, Disp: 120 tablet, Rfl: 0  Review of Systems  Constitutional: Negative for fever.  Gastrointestinal: Negative for blood in stool, constipation, diarrhea, nausea and vomiting.  Genitourinary: Negative for dyspareunia, dysuria, flank pain, frequency, hematuria, urgency, vaginal bleeding, vaginal discharge and vaginal pain.  Musculoskeletal: Negative for back pain.  Skin: Negative for rash.     OBJECTIVE:   Vitals:  BP 120/60   Pulse (!) 54   Ht 5\' 4"  (1.626 m)   Wt 124 lb (56.2 kg)   BMI 21.28 kg/m   Physical Exam  Constitutional: She is well-developed, well-nourished, and in no distress.  Genitourinary: Vulva normal. Vulva exhibits no erythema, no exudate, no lesion, no rash and no tenderness.  Genitourinary Comments: PALE, ATROPHIC SKIN VULVA; HYPERTROPHY RESOLVED; NO AREAS OF TENDERNESS/ERYTHEMA  Vitals reviewed.   Assessment/Plan: Vaginal burning - Resolved. Neg exam. Follow expectantly. Pt will call me if sx recur to discuss retreating with crm vs trying ext vag ERT.   Return if symptoms worsen or fail to improve.  Diarra Kos B. Anubis Fundora, PA-C 09/06/2016 4:51 PM

## 2016-09-08 ENCOUNTER — Encounter: Payer: Self-pay | Admitting: Family Medicine

## 2016-09-08 DIAGNOSIS — G8929 Other chronic pain: Secondary | ICD-10-CM | POA: Insufficient documentation

## 2016-09-11 ENCOUNTER — Telehealth: Payer: Self-pay | Admitting: Family Medicine

## 2016-09-11 DIAGNOSIS — R21 Rash and other nonspecific skin eruption: Secondary | ICD-10-CM

## 2016-09-11 NOTE — Addendum Note (Signed)
Addended by: Eustaquio BoydenGUTIERREZ, Kierstan Auer on: 09/11/2016 05:22 PM   Modules accepted: Orders

## 2016-09-11 NOTE — Telephone Encounter (Signed)
Steward DroneBrenda at [facility] is requesting a cb asap concerning pt's biopsy. [phone].

## 2016-09-11 NOTE — Telephone Encounter (Signed)
I read it back to her to confirm.

## 2016-09-11 NOTE — Telephone Encounter (Signed)
Spoke with brenda PA - thinks allopurinol or lisinopril related drug rash. No recent gout flares. Will stop allopurinol.  plz call pt - I spoke with dermatologist - let's do trial off allopurinol. Stop this medicine.

## 2016-09-11 NOTE — Telephone Encounter (Signed)
Seen 08/29/16 with pcp only

## 2016-09-11 NOTE — Telephone Encounter (Signed)
Left detailed message on voicemail.  

## 2016-09-11 NOTE — Telephone Encounter (Signed)
Are we sure we have the right #? I've called twice and both times went straight to automated voice mail.

## 2016-09-11 NOTE — Telephone Encounter (Signed)
Called back, straight to voicemail.  bx consistent with drug rash, possible culprits lisinopril or allopurinol.

## 2016-09-12 NOTE — Telephone Encounter (Signed)
Patient advised.

## 2016-09-20 ENCOUNTER — Other Ambulatory Visit: Payer: Self-pay | Admitting: Family Medicine

## 2016-09-20 DIAGNOSIS — M79641 Pain in right hand: Secondary | ICD-10-CM | POA: Diagnosis not present

## 2016-09-20 DIAGNOSIS — Z1231 Encounter for screening mammogram for malignant neoplasm of breast: Secondary | ICD-10-CM

## 2016-09-20 DIAGNOSIS — G894 Chronic pain syndrome: Secondary | ICD-10-CM | POA: Diagnosis not present

## 2016-09-20 DIAGNOSIS — M961 Postlaminectomy syndrome, not elsewhere classified: Secondary | ICD-10-CM | POA: Diagnosis not present

## 2016-09-20 DIAGNOSIS — Z79891 Long term (current) use of opiate analgesic: Secondary | ICD-10-CM | POA: Diagnosis not present

## 2016-10-09 ENCOUNTER — Other Ambulatory Visit: Payer: Self-pay | Admitting: Family Medicine

## 2016-10-10 ENCOUNTER — Ambulatory Visit
Admission: RE | Admit: 2016-10-10 | Discharge: 2016-10-10 | Disposition: A | Discharge status: Home / Self Care | Payer: Medicare Other | Source: Ambulatory Visit | Attending: Family Medicine | Admitting: Family Medicine

## 2016-10-10 ENCOUNTER — Ambulatory Visit (INDEPENDENT_AMBULATORY_CARE_PROVIDER_SITE_OTHER): Payer: Medicare Other | Admitting: Internal Medicine

## 2016-10-10 ENCOUNTER — Telehealth: Payer: Self-pay

## 2016-10-10 ENCOUNTER — Telehealth: Payer: Self-pay | Admitting: Family Medicine

## 2016-10-10 ENCOUNTER — Encounter: Payer: Self-pay | Admitting: Internal Medicine

## 2016-10-10 VITALS — BP 116/68 | HR 78 | Temp 97.7°F | Wt 120.0 lb

## 2016-10-10 DIAGNOSIS — Z1231 Encounter for screening mammogram for malignant neoplasm of breast: Secondary | ICD-10-CM | POA: Insufficient documentation

## 2016-10-10 DIAGNOSIS — L03119 Cellulitis of unspecified part of limb: Secondary | ICD-10-CM | POA: Diagnosis not present

## 2016-10-10 MED ORDER — DOXYCYCLINE HYCLATE 100 MG PO TABS: 100.0000 mg | ORAL_TABLET | Freq: Two times a day (BID) | ORAL | 0 refills | Status: DC

## 2016-10-10 NOTE — Patient Instructions (Signed)

## 2016-10-10 NOTE — Progress Notes (Signed)
Subjective:    Patient ID: [name], female    DOB: 11/25/1932, 83 y.o.   MRN: [id number]  HPI  Pt presents to the clinic today with c/o worsening rash. She reports she has had the rash for 3 months. The rash is very itchy. She is concerned because her left are is now red, swollen and hot. She saw PCP 5/29 for the same. She had previously tried Permethrin cream with some relief. It had a refill and he advised her to try that again. She saw dermatology and had biopsies taken x 2. They felt like it may be a drug rash, possibly Allopurinol or Lisinopril. They stopped her Allopurinol 1 week ago but she reports the rash has not improved. She has also tried Epsom salt soaks.  Review of Systems      Past Medical History:  Diagnosis Date  . Arthritis   . Atherosclerosis of abdominal aorta (HCC)    by xray  . Atrial fibrillation (HCC)   . CAD (coronary artery disease)    95% LAD stenosis, [place] x 02 September 1996  . Cervicalgia   . CHF (congestive heart failure) (HCC) 2014   on recent hospitalization  . Chronic interstitial lung disease (HCC) 08/2015   by CXR  . Depression with anxiety    celexa started 04/2010  . Dyslipidemia   . Facial trauma 06/01/2014   Fall 05/2014 without fracture by facial/head CT   . Gout    prior PCP stopped allopurinol  . Hyperlipidemia   . Hypertension   . LBP (low back pain) 2005   spinal stenosis and bulging discs at L2-L5 s/p lumbar laminectomy [place])  . Lymphocytic colitis 10/28/2013  . Osteopenia 07/2013   T score -2.2 L femur  . Peripheral neuropathy since feb 2015   right arm   . Prediabetes 2001  . Syncopal episodes 2015   orthostatic, normal EEG and neuro eval ([place])  . T12 compression fracture (HCC) 2015   by CT    Current Outpatient Prescriptions  Medication Sig Dispense Refill  . aspirin EC 81 MG tablet Take 1 tablet (81 mg total) by mouth daily.    Marland Kitchen. atorvastatin (LIPITOR) 40 MG tablet Take 40 mg by mouth daily.    .  clotrimazole-betamethasone (LOTRISONE) cream Apply 1 application topically 2 (two) times daily. Apply externally BID for 2 wks 15 g 0  . digoxin (LANOXIN) 0.125 MG tablet TAKE ONE TABLET BY MOUTH EVERY DAY 30 tablet 5  . FLUoxetine (PROZAC) 20 MG capsule Take 20 mg by mouth daily.    . furosemide (LASIX) 20 MG tablet Take 0.5 tablets (10 mg total) by mouth daily. (Patient taking differently: Take 10 mg by mouth daily as needed for edema. ) 30 tablet 6  . lisinopril (PRINIVIL,ZESTRIL) 5 MG tablet Take 5 mg by mouth daily.    . metoprolol tartrate (LOPRESSOR) 25 MG tablet TAKE ONE TABLET BY MOUTH TWICE DAILY 60 tablet 3  . Multiple Vitamins-Minerals (ONE-A-DAY WOMENS 50+ ADVANTAGE) TABS Take 1 tablet by mouth daily.     . nitroGLYCERIN (NITROSTAT) 0.4 MG SL tablet Place 0.4 mg under the tongue every 5 (five) minutes as needed for chest pain.    Marland Kitchen. nystatin cream (MYCOSTATIN) APPLY TOPICALLY TWICE A DAY 30 g 1  . Oxycodone HCl 10 MG TABS Take 1 tablet (10 mg total) by mouth as directed. Pain management Dr Vear ClockPhillips 120 tablet 0   No current facility-administered medications for this visit.  Allergies  Allergen Reactions  . Coumadin [Warfarin Sodium] Other (See Comments)    Makes pass out  . Adhesive [Tape] Rash  . Ivp Dye [Iodinated Diagnostic Agents] Other (See Comments)    Rash and turns purple Rash and turns purple  . Vytorin [Ezetimibe-Simvastatin] Other (See Comments)    leg cramps    Family History  Problem Relation Age of Onset  . Prostate cancer Father   . Breast cancer Sister 24  . Heart disease Brother   . Heart disease Other        First degree relatives with heart disease but later onset  . Heart disease Sister        pacer  . Esophageal cancer Neg Hx   . Colon cancer Neg Hx     Social History   Social History  . Marital status: Widowed    Spouse name: N/A  . Number of children: 1  . Years of education: N/A   Occupational History  .  Retired   Social  History Main Topics  . Smoking status: Never Smoker  . Smokeless tobacco: Never Used  . Alcohol use Yes     Comment: Occasionally drinks beer  . Drug use: No  . Sexual activity: Not on file   Other Topics Concern  . 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   Skin: Pt reports rash, swelling and redness of left arm.    No other specific complaints in a complete review of systems (except as listed in HPI above).  Objective:   Physical Exam    BP 116/68   Pulse 78   Temp 97.7 F (36.5 C) (Oral)   Wt 120 lb (54.4 kg)   SpO2 97%   BMI 20.60 kg/m  Wt Readings from Last 3 Encounters:  10/10/16 120 lb (54.4 kg)  09/06/16 124 lb (56.2 kg)  08/29/16 117 lb 4 oz (53.2 kg)    General: Appears her stated age, well developed, well nourished in NAD. Skin: Rash noted of bilateral arms and legs. Concern for cellulitis noted on left forearm.   BMET    Component Value Date/Time   NA 140 07/21/2016 1133   NA 130 (L) 05/31/2013 1918   K 3.5 07/21/2016 1133   K 3.5 05/31/2013 1918   K 4.2 04/30/2012   CL 103 07/21/2016 1133   CL 93 (L) 05/31/2013 1918   CO2 28 07/21/2016 1133   CO2 31 05/31/2013 1918   GLUCOSE 120 (H) 07/21/2016 1133   GLUCOSE 141 (H) 05/31/2013 1918   BUN 9 07/21/2016 1133   BUN 14 05/31/2013 1918   CREATININE 0.87 07/21/2016 1133   CREATININE 0.94 05/31/2013 1918   CREATININE 1.0 04/30/2012   CALCIUM 9.6 07/21/2016 1133   CALCIUM 9.4 05/31/2013 1918   GFRNONAA >60 02/08/2016 0335   GFRNONAA 58 (L) 05/31/2013 1918   GFRAA >60 02/08/2016 0335   GFRAA >60 05/31/2013 1918    Lipid Panel     Component Value Date/Time   CHOL 193 08/29/2016 0949   CHOL 199 04/15/2012 0150   TRIG 89.0 08/29/2016 0949   TRIG 136 04/15/2012 0150    TRIG 243 01/16/2012   HDL 79.80 08/29/2016 0949   HDL 38 (L) 04/15/2012  0150   CHOLHDL 2 08/29/2016 0949   VLDL 17.8 08/29/2016 0949   VLDL 27 04/15/2012 0150   LDLCALC 96 08/29/2016 0949   LDLCALC 134 (H) 04/15/2012 0150   LDLCALC 93.6 01/16/2012    CBC    Component Value Date/Time   WBC 7.9 07/21/2016 1133   RBC 3.69 (L) 07/21/2016 1133   HGB 12.3 07/21/2016 1133   HGB 13.3 05/31/2013 1918   HGB 14.1 01/16/2012   HCT 37.3 07/21/2016 1133   HCT 39.2 05/31/2013 1918   PLT 204.0 07/21/2016 1133   PLT 261 05/31/2013 1918   MCV 101.2 (H) 07/21/2016 1133   MCV 101 (H) 05/31/2013 1918   MCH 34.7 (H) 02/08/2016 0335   MCHC 32.9 07/21/2016 1133   RDW 13.1 07/21/2016 1133   RDW 13.0 05/31/2013 1918   LYMPHSABS 1.2 07/21/2016 1133   LYMPHSABS 0.9 (L) 04/15/2012 0150   MONOABS 0.6 07/21/2016 1133   MONOABS 0.1 (L) 04/15/2012 0150   EOSABS 0.7 07/21/2016 1133   EOSABS 0.0 04/15/2012 0150   BASOSABS 0.1 07/21/2016 1133   BASOSABS 0.0 04/15/2012 0150    Hgb A1C Lab Results  Component Value Date   HGBA1C 5.3 04/06/2015          Assessment & Plan:   Cellulitis of Left Forearm:  eRx for Doxycycline 100 mg BID Warm compresses prn Keep left arm elevated above the level of your heart  Return precautions discussed Nicki Reaper, NP

## 2016-10-10 NOTE — Telephone Encounter (Signed)
PLEASE NOTE: All timestamps contained within this report are represented as [place] Standard Time. CONFIDENTIALTY NOTICE: This fax transmission is intended only for the addressee. It contains information that is legally privileged, confidential or otherwise protected from use or disclosure. If you are not the intended recipient, you are strictly prohibited from reviewing, disclosing, copying using or disseminating any of this information or taking any action in reliance on or regarding this information. If you have received this fax in error, please notify us immediately by telephone so that we can arrange for its return to us. Phone: [phone], Toll-Free: [phone], Fax: [phone] Page: 1 of 1 Call Id: [id number] [facility] Client TELEPHONE ADVICE RECORD [facility] Patient Name: [name] Gender: Female DOB: 04/08/1932 Age: [age] Y 4 M 8 D Return Phone Number: [phone] (Primary), [phone] (Secondary) City/State/Zip: [place] Client [facility] Physician Eustaquio BoydenGutierrez, Javier - MD Who Is Calling Patient / Member / Family / Caregiver Call Type Triage / Clinical Relationship To Patient Self Return Phone Number [phone] (Secondary) Chief Complaint Rash - Widespread Reason for Call Symptomatic / Request for Health Information Initial Comment Caller states has a bad itchy rash Appointment Disposition EMR Appointment Scheduled Info pasted into Epic Yes Nurse Assessment Nurse: Renaldo FiddlerAdkins, RN, Raynelle FanningJulie Date/Time Lamount Cohen(Eastern Time): 10/10/2016 10:29:14 AM Confirm and document reason for call. If symptomatic, describe symptoms. ---Caller states she has a bad itchy rash. She has been seen by Dr. Reece AgarG and a Dermatolgist x 2. The rash is on both arms, hands, legs and lower back. States today her left arm is swollen today. Does the PT have any chronic conditions?  (i.e. diabetes, asthma, etc.) ---Yes List chronic conditions. ---Htn, High cholesterol, CHF, A fib/cardiac stents Guidelines Guideline Title Affirmed Question Rash or Redness - Widespread SEVERE itching (i.e., interferes with sleep, normal activities or school) Disp. Time Lamount Cohen(Eastern Time) Disposition Final User 10/10/2016 10:48:09 AM See Physician within 24 Hours Yes Renaldo FiddlerAdkins, RN, Raynelle FanningJulie Referrals REFERRED TO PCP OFFICE Care Advice Given Per Guideline SEE PHYSICIAN WITHIN 24 HOURS: * IF OFFICE WILL BE OPEN: You need to be seen within the next 24 hours. Call your doctor when the office opens, and make an appointment. * Rash becomes purple or blood-colored or blister-like * Fever occurs * You become worse. CARE ADVICE given per Rash - Widespread and Cause Unknown (Adult) guideline.

## 2016-10-10 NOTE — Telephone Encounter (Signed)
Pt has appt to see Pamala Hurry Baity NP on 10/10/16 at 4:15.

## 2016-10-11 LAB — HM MAMMOGRAPHY

## 2016-10-13 ENCOUNTER — Ambulatory Visit (INDEPENDENT_AMBULATORY_CARE_PROVIDER_SITE_OTHER): Payer: Medicare Other | Admitting: Family Medicine

## 2016-10-13 ENCOUNTER — Encounter: Payer: Self-pay | Admitting: Family Medicine

## 2016-10-13 DIAGNOSIS — R21 Rash and other nonspecific skin eruption: Secondary | ICD-10-CM

## 2016-10-13 MED ORDER — CEPHALEXIN 500 MG PO CAPS: 500.0000 mg | ORAL_CAPSULE | Freq: Three times a day (TID) | ORAL | 0 refills | Status: DC

## 2016-10-13 MED ORDER — PREDNISONE 20 MG PO TABS: 20.0000 mg | ORAL_TABLET | Freq: Every day | ORAL | 0 refills | Status: DC

## 2016-10-13 NOTE — Progress Notes (Signed)
Diffuse skin changes and L arm rash.  Possible drug rash from allopurinol vs lisinopril prev noted.  Still on lisinopril.  Off allopurinol.  Still itching.  Concern for L arm cellulitis recently and started on doxy.  In the meantime, no fevers but her left arm is not much improved.  Discussed with PCP and also with Baity, who had seen patient recently.  Meds, vitals, and allergies reviewed.   ROS: Per HPI unless specifically indicated in ROS section   nad ncat rrr ctab Abd soft Ext w/o edema L forearm with cellulitis- the skin appears less taut from prev but still with significant erythema.  She still has B arm and leg lesions, and facial lesion that itch.

## 2016-10-13 NOTE — Patient Instructions (Signed)
Stop lisinopril.   Continue doxycycline.  Add on keflex 3 times a day.   Start taking prednisone for itching.  Take it with food.   Recheck with Dr. Reece AgarG early next week.   Update us as needed over the weekend.   Take care.  Glad to see you.

## 2016-10-15 NOTE — Assessment & Plan Note (Signed)
2 separate issues.  #1. Diffuse skin changes. Possible drug rash. Stop lisinopril, especially since her blood pressure is lower today. Start prednisone, given the itching. Routine steroid cautions given. Plan discussed with patient. Details instructions given to patient. She agrees.  #2. Presumed cellulitis on the left forearm. Still okay for outpatient follow-up. No fluctuant mass that needs incision and drainage. No sign of compartment syndrome given that she does not have taught tissues and she is distally vascularly intact. Add on Keflex. Update us Monday. Okay for outpatient follow-up. She agrees.  >25 minutes spent in face to face time with patient, >50% spent in counselling or coordination of care.

## 2016-10-17 ENCOUNTER — Encounter: Payer: Self-pay | Admitting: Family Medicine

## 2016-10-17 ENCOUNTER — Ambulatory Visit (INDEPENDENT_AMBULATORY_CARE_PROVIDER_SITE_OTHER): Payer: Medicare Other | Admitting: Family Medicine

## 2016-10-17 VITALS — BP 142/74 | HR 60 | Temp 97.7°F | Wt 120.5 lb

## 2016-10-17 DIAGNOSIS — I10 Essential (primary) hypertension: Secondary | ICD-10-CM

## 2016-10-17 DIAGNOSIS — R21 Rash and other nonspecific skin eruption: Secondary | ICD-10-CM | POA: Diagnosis not present

## 2016-10-17 NOTE — Patient Instructions (Signed)
Rash and cellulitis are looking better! Finish antibiotics and prednisone course.  Continue desitin multiple times a day.  Continue nightly arm wraps with desitin.  Rash may be lisinopril allergy drug rash - we will see if rash gets better off this medicine.

## 2016-10-17 NOTE — Progress Notes (Signed)
BP (!) 142/74   Pulse 60   Temp 97.7 F (36.5 C) (Oral)   Wt 120 lb 8 oz (54.7 kg)   SpO2 98%   BMI 20.68 kg/m    CC: f/u ?cellulitis Subjective:    Patient ID: [name], female    DOB: 1932-08-07, 83 y.o.   MRN: [id number]  HPI: [name] is a 83 y.o. female presenting on 10/17/2016 for Follow-up   See recent notes for details.  Recent chronic diffuse rash saw derm s/p biopsies most consistent with drug rash presumed to allopurinol or lisinopril. No improvement with discontinuing allopurinol so last week we held lisinopril.   Marked erythema to R forearm thought likely cellulitis last week, started on doxycycline abx. Seen again last week without improvement, started on keflex and prednisone in addition to her doxycycline.   She is regularly using desitin barrier cream to skin. She is regularly using 1 teaspoon of miralax daily with good results.    Relevant past medical, surgical, family and social history reviewed and updated as indicated. Interim medical history since our last visit reviewed. Allergies and medications reviewed and updated. Outpatient Medications Prior to Visit  Medication Sig Dispense Refill  . aspirin EC 81 MG tablet Take 1 tablet (81 mg total) by mouth daily.    Marland Kitchen atorvastatin (LIPITOR) 40 MG tablet Take 40 mg by mouth daily.    . cephALEXin (KEFLEX) 500 MG capsule Take 1 capsule (500 mg total) by mouth 3 (three) times daily. 21 capsule 0  . clotrimazole-betamethasone (LOTRISONE) cream Apply 1 application topically 2 (two) times daily. Apply externally BID for 2 wks 15 g 0  . digoxin (LANOXIN) 0.125 MG tablet TAKE ONE TABLET BY MOUTH EVERY DAY 30 tablet 5  . doxycycline (VIBRA-TABS) 100 MG tablet Take 1 tablet (100 mg total) by mouth 2 (two) times daily. 20 tablet 0  . FLUoxetine (PROZAC) 20 MG capsule Take 20 mg by mouth daily.    . furosemide (LASIX) 20 MG tablet Take 0.5 tablets (10 mg total) by mouth daily. (Patient taking  differently: Take 10 mg by mouth daily as needed for edema. ) 30 tablet 6  . metoprolol tartrate (LOPRESSOR) 25 MG tablet TAKE ONE TABLET BY MOUTH TWICE DAILY 60 tablet 3  . Multiple Vitamins-Minerals (ONE-A-DAY WOMENS 50+ ADVANTAGE) TABS Take 1 tablet by mouth daily.     . nitroGLYCERIN (NITROSTAT) 0.4 MG SL tablet Place 0.4 mg under the tongue every 5 (five) minutes as needed for chest pain.    Marland Kitchen nystatin cream (MYCOSTATIN) APPLY TOPICALLY TWICE A DAY 30 g 1  . predniSONE (DELTASONE) 20 MG tablet Take 1 tablet (20 mg total) by mouth daily with breakfast. 10 tablet 0   No facility-administered medications prior to visit.      Per HPI unless specifically indicated in ROS section below Review of Systems     Objective:    BP (!) 142/74   Pulse 60   Temp 97.7 F (36.5 C) (Oral)   Wt 120 lb 8 oz (54.7 kg)   SpO2 98%   BMI 20.68 kg/m   Wt Readings from Last 3 Encounters:  10/17/16 120 lb 8 oz (54.7 kg)  10/13/16 116 lb (52.6 kg)  10/10/16 120 lb (54.4 kg)    Physical Exam  Constitutional: She appears well-developed and well-nourished. No distress.  Using cane today  HENT:  Mouth/Throat: Oropharynx is clear and moist. No oropharyngeal exudate.  Cardiovascular: Normal rate, regular rhythm, normal heart  sounds and intact distal pulses.   No murmur heard. Pulmonary/Chest: Effort normal and breath sounds normal. No respiratory distress. She has no wheezes. She has no rales.  Musculoskeletal: She exhibits no edema.  Skin: Skin is warm and dry. Rash noted. There is erythema.  Significantly improved erythema of left forearm with persistent dry skin and healing erosions Healing erosions throughout bilateral extremities   Psychiatric:  Circumferential   Nursing note and vitals reviewed.  Results for orders placed or performed in visit on 10/10/16  [facility] MAMMOGRAPHY  Result Value Ref Range   HM Mammogram 0-4 Bi-Rad 0-4 Bi-Rad, Self Reported Normal      Assessment & Plan:  I asked her  to return in 2 mo for routine f/u visit.  Problem List Items Addressed This Visit    Essential hypertension, benign    bp mildly elevated but adequate off lisinopril. Continue to monitor.       Rash and nonspecific skin eruption - Primary    Cellulitis is significantly improved - continue current regimen of doxy, keflex, prednisone. Other rash - erosions with excoriations presumed drug rash to lisinopril (not improved off allopurinol) seems to be in stages of healing. Discussed wound care at home. Update if not improving in 2 wks time off lisinopril. Pt asks about [facility] derm referral.  Reviewed current medications.           Follow up plan: Return in about 2 months (around 12/18/2016), or if symptoms worsen or fail to improve, for follow up visit.  Eustaquio Boyden, MD

## 2016-10-17 NOTE — Assessment & Plan Note (Signed)
bp mildly elevated but adequate off lisinopril. Continue to monitor.

## 2016-10-17 NOTE — Assessment & Plan Note (Addendum)
Cellulitis is significantly improved - continue current regimen of doxy, keflex, prednisone. Other rash - erosions with excoriations presumed drug rash to lisinopril (not improved off allopurinol) seems to be in stages of healing. Discussed wound care at home. Update if not improving in 2 wks time off lisinopril. Pt asks about [facility] derm referral.  Reviewed current medications.

## 2016-10-24 ENCOUNTER — Emergency Department (HOSPITAL_COMMUNITY)
Admission: EM | Admit: 2016-10-24 | Discharge: 2016-10-24 | Disposition: A | Discharge status: Home / Self Care | Payer: Medicare Other | Attending: Physician Assistant | Admitting: Physician Assistant

## 2016-10-24 ENCOUNTER — Emergency Department (HOSPITAL_COMMUNITY): Payer: Medicare Other

## 2016-10-24 ENCOUNTER — Encounter (HOSPITAL_COMMUNITY): Payer: Self-pay | Admitting: Vascular Surgery

## 2016-10-24 DIAGNOSIS — S32019A Unspecified fracture of first lumbar vertebra, initial encounter for closed fracture: Secondary | ICD-10-CM | POA: Diagnosis not present

## 2016-10-24 DIAGNOSIS — I5032 Chronic diastolic (congestive) heart failure: Secondary | ICD-10-CM | POA: Insufficient documentation

## 2016-10-24 DIAGNOSIS — F329 Major depressive disorder, single episode, unspecified: Secondary | ICD-10-CM | POA: Diagnosis not present

## 2016-10-24 DIAGNOSIS — F419 Anxiety disorder, unspecified: Secondary | ICD-10-CM | POA: Diagnosis not present

## 2016-10-24 DIAGNOSIS — I110 Hypertensive heart disease with heart failure: Secondary | ICD-10-CM | POA: Diagnosis not present

## 2016-10-24 DIAGNOSIS — E039 Hypothyroidism, unspecified: Secondary | ICD-10-CM | POA: Diagnosis not present

## 2016-10-24 DIAGNOSIS — W010XXA Fall on same level from slipping, tripping and stumbling without subsequent striking against object, initial encounter: Secondary | ICD-10-CM | POA: Insufficient documentation

## 2016-10-24 DIAGNOSIS — Y9389 Activity, other specified: Secondary | ICD-10-CM | POA: Insufficient documentation

## 2016-10-24 DIAGNOSIS — G8929 Other chronic pain: Secondary | ICD-10-CM | POA: Diagnosis not present

## 2016-10-24 DIAGNOSIS — X58XXXA Exposure to other specified factors, initial encounter: Secondary | ICD-10-CM | POA: Diagnosis not present

## 2016-10-24 DIAGNOSIS — M545 Low back pain, unspecified: Secondary | ICD-10-CM

## 2016-10-24 DIAGNOSIS — Y998 Other external cause status: Secondary | ICD-10-CM | POA: Insufficient documentation

## 2016-10-24 DIAGNOSIS — S3992XA Unspecified injury of lower back, initial encounter: Secondary | ICD-10-CM | POA: Diagnosis present

## 2016-10-24 DIAGNOSIS — S32010A Wedge compression fracture of first lumbar vertebra, initial encounter for closed fracture: Secondary | ICD-10-CM

## 2016-10-24 DIAGNOSIS — Z79899 Other long term (current) drug therapy: Secondary | ICD-10-CM | POA: Diagnosis not present

## 2016-10-24 DIAGNOSIS — I2510 Atherosclerotic heart disease of native coronary artery without angina pectoris: Secondary | ICD-10-CM | POA: Insufficient documentation

## 2016-10-24 DIAGNOSIS — Z7982 Long term (current) use of aspirin: Secondary | ICD-10-CM | POA: Insufficient documentation

## 2016-10-24 DIAGNOSIS — Y92009 Unspecified place in unspecified non-institutional (private) residence as the place of occurrence of the external cause: Secondary | ICD-10-CM | POA: Insufficient documentation

## 2016-10-24 LAB — CBC
HCT: 37 % (ref 36.0–46.0)
Hemoglobin: 12.2 g/dL (ref 12.0–15.0)
MCH: 31.4 pg (ref 26.0–34.0)
MCHC: 33 g/dL (ref 30.0–36.0)
MCV: 95.4 fL (ref 78.0–100.0)
PLATELETS: 210 10*3/uL (ref 150–400)
RBC: 3.88 MIL/uL (ref 3.87–5.11)
RDW: 14.8 % (ref 11.5–15.5)
WBC: 14 10*3/uL — ABNORMAL HIGH (ref 4.0–10.5)

## 2016-10-24 LAB — LIPASE, BLOOD: LIPASE: 24 U/L (ref 11–51)

## 2016-10-24 LAB — COMPREHENSIVE METABOLIC PANEL
ALBUMIN: 3.8 g/dL (ref 3.5–5.0)
ALK PHOS: 88 U/L (ref 38–126)
ALT: 17 U/L (ref 14–54)
AST: 28 U/L (ref 15–41)
Anion gap: 11 (ref 5–15)
BUN: 25 mg/dL — AB (ref 6–20)
CALCIUM: 9.6 mg/dL (ref 8.9–10.3)
CHLORIDE: 105 mmol/L (ref 101–111)
CO2: 24 mmol/L (ref 22–32)
CREATININE: 0.91 mg/dL (ref 0.44–1.00)
GFR calc Af Amer: >60 mL/min (ref >60)
GFR calc non Af Amer: 57 mL/min — ABNORMAL LOW (ref >60)
GLUCOSE: 126 mg/dL — AB (ref 65–99)
Potassium: 3.5 mmol/L (ref 3.5–5.1)
SODIUM: 140 mmol/L (ref 135–145)
Total Bilirubin: 1 mg/dL (ref 0.3–1.2)
Total Protein: 6.3 g/dL — ABNORMAL LOW (ref 6.5–8.1)

## 2016-10-24 MED ORDER — ONDANSETRON 4 MG PO TBDP
4.0000 mg | ORAL_TABLET | Freq: Once | ORAL | Status: AC
  Administered 2016-10-24: 4 mg via ORAL

## 2016-10-24 MED ORDER — OXYCODONE-ACETAMINOPHEN 5-325 MG PO TABS
2.0000 | ORAL_TABLET | Freq: Once | ORAL | Status: AC
  Administered 2016-10-24: 2 via ORAL
  Filled 2016-10-24: qty 2

## 2016-10-24 MED ORDER — ONDANSETRON 4 MG PO TBDP
ORAL_TABLET | ORAL | Status: AC
  Filled 2016-10-24: qty 1

## 2016-10-24 NOTE — ED Triage Notes (Signed)
Pt reports to the ED for eval of back pain following a fall that occurred several days ago. She has tried pain patches but they broke her back out so she couldn't use those. She also reports rash to bilateral arms and she has been seen by her PCP and 2 dermatologist and they are treating her but they are not sure what it is. She has tried Oxycodone for the pain without relief. She denies any numbness, tingling, paralysis, or bowel or bladder incontinence.

## 2016-10-24 NOTE — ED Notes (Signed)
Pt able to ambulate in hall without any difficulty

## 2016-10-24 NOTE — ED Triage Notes (Signed)
Pt also reports she has been having N/V since last night

## 2016-10-24 NOTE — ED Provider Notes (Signed)
[facility]EMER[facility]ENCY DEPT Provider Note   CSN: [id number] Arrival date & time: 10/24/16  1633     History   Chief Complaint Chief Complaint  Patient presents with  . Fall  . Back Pain    HPI [name] is a 83 y.o. female.  Patient presents with complaint of lower, right sided back pain. She has chronic pain in this area that she states is uncontrolled at home. She reports she fell 2-3 days ago which exacerbated her usual pain. No radiation to the right leg but she feels her RLE range of motion is limited due to pain. She walks with a [place] at home. She also reports that her doctor recently cut her pain medication in 1/2 due to recent falls. She was taking oxycodone 10 mg up to 3 times daily and he has now reduced that to 5 mg up the three times daily. No abdominal pain, chest pain, SOB, fever, nausea, vomiting or diarrhea. No urinary/bowel incontinence, saddle anesthesia or LE numbness.    The history is provided by the patient. No language interpreter was used.    Past Medical History:  Diagnosis Date  . Arthritis   . Atherosclerosis of abdominal aorta (HCC)    by xray  . Atrial fibrillation (HCC)   . CAD (coronary artery disease)    95% LAD stenosis, [place] x 02 September 1996  . Cervicalgia   . CHF (congestive heart failure) (HCC) 2014   on recent hospitalization  . Chronic interstitial lung disease (HCC) 08/2015   by CXR  . Depression with anxiety    celexa started 04/2010  . Dyslipidemia   . Facial trauma 06/01/2014   Fall 05/2014 without fracture by facial/head CT   . Gout    prior PCP stopped allopurinol  . Hyperlipidemia   . Hypertension   . LBP (low back pain) 2005   spinal stenosis and bulging discs at L2-L5 s/p lumbar laminectomy Dutch Quint([place])  . Lymphocytic colitis 10/28/2013  . Osteopenia 07/2013   T score -2.2 L femur  . Peripheral neuropathy since feb 2015   right arm   . Prediabetes 2001  . Syncopal episodes 2015   orthostatic, normal EEG and neuro eval  ([place])  . T12 compression fracture (HCC) 2015   by CT    Patient Active Problem List   Diagnosis Date Noted  . Chronic pain 09/08/2016  . Health maintenance examination 08/29/2016  . Carotid stenosis, bilateral 08/19/2016  . Vaginal itching 08/01/2016  . UTI (urinary tract infection) 07/01/2016  . Cellulitis of finger of right hand 02/04/2016  . Memory impairment 10/04/2015  . Chronic interstitial lung disease (HCC) 08/02/2015  . Advanced care planning/counseling discussion 04/14/2015  . Recurrent falls 04/14/2015  . Right shoulder pain 09/24/2014  . Candidal vulvovaginitis 05/26/2014  . Chronic diastolic CHF (congestive heart failure) (HCC)   . Syncope and collapse 01/01/2014  . Rib pain on left side 10/28/2013  . Lymphocytic colitis 10/28/2013  . Loss of weight 08/18/2013  . Medicare annual wellness visit, subsequent 06/11/2013  . Depression with anxiety   . Hypothyroidism 06/04/2013  . Atrial fibrillation (HCC)   . Peripheral neuropathy   . Osteopenia   . LBP (low back pain)   . Gout   . Cramping of feet 07/19/2010  . Palpitations 07/19/2010  . EDEMA 01/19/2009  . HLD (hyperlipidemia) 07/02/2008  . Essential hypertension, benign 07/02/2008  . Coronary atherosclerosis 07/02/2008  . Rash and nonspecific skin eruption 07/02/2008    Past Surgical History:  Procedure Laterality Date  . CARDIAC CATHETERIZATION    . CATARACT EXTRACTION Bilateral   . COLONOSCOPY WITH PROPOFOL N/A 10/16/2013   Rachael Fee, MD  . DEXA  07/2013   T score -2.2 L femur  . INCISION AND DRAINAGE Right 02/06/2016   Procedure: INCISION AND DRAINAGE right middle finger;  Surgeon: Juanell Fairly, MD;  Location: [facility] ORS;  Service: Orthopedics;  Laterality: Right;  . LUMBAR LAMINECTOMY  2005   Dr. Dutch Quint  . PERCUTANEOUS CORONARY STENT INTERVENTION (PCI-S)     stent x3 in heart  . TONSILLECTOMY    . TUBAL LIGATION      OB History    Gravida Para Term Preterm AB Living   1 1 1     1     SAB TAB Ectopic Multiple Live Births           1       Home Medications    Prior to Admission medications   Medication Sig Start Date End Date Taking? Authorizing Provider  aspirin EC 81 MG tablet Take 1 tablet (81 mg total) by mouth daily. 04/14/15   Eustaquio Boyden, MD  atorvastatin (LIPITOR) 40 MG tablet Take 40 mg by mouth daily.    [provider]  cephALEXin (KEFLEX) 500 MG capsule Take 1 capsule (500 mg total) by mouth 3 (three) times daily. 10/13/16   Joaquim Nam, MD  clotrimazole-betamethasone (LOTRISONE) cream Apply 1 application topically 2 (two) times daily. Apply externally BID for 2 wks 08/16/16   Copland, Helmut Muster B, PA-C  digoxin (LANOXIN) 0.125 MG tablet TAKE ONE TABLET BY MOUTH EVERY DAY 10/09/16   Eustaquio Boyden, MD  doxycycline (VIBRA-TABS) 100 MG tablet Take 1 tablet (100 mg total) by mouth 2 (two) times daily. 10/10/16   Lorre Munroe, NP  FLUoxetine (PROZAC) 20 MG capsule Take 20 mg by mouth daily.    [provider]  furosemide (LASIX) 20 MG tablet Take 0.5 tablets (10 mg total) by mouth daily. Patient taking differently: Take 10 mg by mouth daily as needed for edema.  01/04/16   Rollene Rotunda, MD  metoprolol tartrate (LOPRESSOR) 25 MG tablet TAKE ONE TABLET BY MOUTH TWICE DAILY 06/19/16   Eustaquio Boyden, MD  Multiple Vitamins-Minerals (ONE-A-DAY WOMENS 50+ ADVANTAGE) TABS Take 1 tablet by mouth daily.     [provider]  nitroGLYCERIN (NITROSTAT) 0.4 MG SL tablet Place 0.4 mg under the tongue every 5 (five) minutes as needed for chest pain.    [provider]  nystatin cream (MYCOSTATIN) APPLY TOPICALLY TWICE A DAY 08/17/16   Eustaquio Boyden, MD  predniSONE (DELTASONE) 20 MG tablet Take 1 tablet (20 mg total) by mouth daily with breakfast. 10/13/16   Joaquim Nam, MD    Family History Family History  Problem Relation Age of Onset  . Prostate cancer Father   . Breast cancer Sister 5  . Heart disease Brother   .  Heart disease Other        First degree relatives with heart disease but later onset  . Heart disease Sister        pacer  . Esophageal cancer Neg Hx   . Colon cancer Neg Hx     Social History Social History  Substance Use Topics  . Smoking status: Never Smoker  . Smokeless tobacco: Never Used  . Alcohol use Yes     Comment: Occasionally drinks beer     Allergies   Coumadin [warfarin sodium]; Adhesive [tape]; Ivp  dye [iodinated diagnostic agents]; and Vytorin [ezetimibe-simvastatin]   Review of Systems Review of Systems  Constitutional: Negative for chills and fever.  Respiratory: Negative.   Cardiovascular: Negative.   Gastrointestinal: Negative.   Genitourinary: Negative for enuresis.  Musculoskeletal: Positive for back pain.       See HPI.  Skin: Negative.   Neurological: Negative.  Negative for weakness and numbness.     Physical Exam Updated Vital Signs BP (!) 169/73   Pulse 92   Temp 98.5 F (36.9 C) (Oral)   Resp 16   SpO2 99%   Physical Exam  Constitutional: She is oriented to person, place, and time. She appears well-developed and well-nourished.  HENT:  Head: Normocephalic.  Neck: Normal range of motion. Neck supple.  Cardiovascular: Normal rate and regular rhythm.   Pulmonary/Chest: Effort normal and breath sounds normal. She has no wheezes. She has no rales.  Abdominal: Soft. Bowel sounds are normal. There is no tenderness. There is no rebound and no guarding.  Musculoskeletal: Normal range of motion. She exhibits no edema.  There is no swelling or trauma to the back. There is no reproducible tenderness to the area of complaint.   Neurological: She is alert and oriented to person, place, and time.  Skin: Skin is warm and dry. No rash noted.  Psychiatric: She has a normal mood and affect.     ED Treatments / Results  Labs (all labs ordered are listed, but only abnormal results are displayed) Labs Reviewed  COMPREHENSIVE METABOLIC PANEL -  Abnormal; Notable for the following:       Result Value   Glucose, Bld 126 (*)    BUN 25 (*)    Total Protein 6.3 (*)    GFR calc non Af Amer 57 (*)    All other components within normal limits  CBC - Abnormal; Notable for the following:    WBC 14.0 (*)    All other components within normal limits  LIPASE, BLOOD  URINALYSIS, ROUTINE W REFLEX MICROSCOPIC    EKG  EKG Interpretation None       Radiology No results found.  Procedures Procedures (including critical care time)  Medications Ordered in ED Medications  oxyCODONE-acetaminophen (PERCOCET/ROXICET) 5-325 MG per tablet 2 tablet (not administered)     Initial Impression / Assessment and Plan / ED Course  I have reviewed the triage vital signs and the nursing notes.  Pertinent labs & imaging results that were available during my care of the patient were reviewed by me and considered in my medical decision making (see chart for details).     Patient with a history of chronic low back pain presents with uncontrolled pain. She does report she fell several days ago, but also reports her pain medication has been decreased by her doctor this week.   Will perform lumbar imaging at patient's request. She has no neurologic red flags. Will treat pain with her previous reported medication.   She has a new lumbar compression fracture at L1. Pain medication has provided relief. She is ambulatory with assistance. She can be discharged home with Buenger use as usual, and follow up with PCP prn.  Final Clinical Impressions(s) / ED Diagnoses   Final diagnoses:  None   1. L1 compression fracture 2. Chronic back pain  New Prescriptions New Prescriptions   No medications on file     Danne [place] 10/24/16 2243    Abelino Derrick, MD 10/25/16 0002

## 2016-10-24 NOTE — ED Notes (Signed)
Patient transported to X-ray 

## 2016-10-24 NOTE — Discharge Instructions (Signed)
Continue taking your regular pain medication. You can take Aleve in addition to your oxycodone for additional relief. See your doctor as needed.

## 2016-10-25 ENCOUNTER — Encounter: Payer: Self-pay | Admitting: Family Medicine

## 2016-10-25 DIAGNOSIS — S32010A Wedge compression fracture of first lumbar vertebra, initial encounter for closed fracture: Secondary | ICD-10-CM | POA: Insufficient documentation

## 2016-11-01 ENCOUNTER — Telehealth: Payer: Self-pay | Admitting: Family Medicine

## 2016-11-01 NOTE — Telephone Encounter (Signed)
Failed to route previous triage -  Patient Name: Sherry GrumblingLIZABETH Sutherland  DOB: 03/18/1933    Initial Comment Caller states she fell a couple weeks ago. she's in a lot of pain in her back and has a low fever. She fell again last night.   Nurse Assessment  Nurse: Nicanor Bakeosta, RN, Marylene LandAngela Date/Time (Eastern Time): 11/01/2016 5:37:18 PM  Confirm and document reason for call. If symptomatic, describe symptoms. ---Pt has appt on Friday. She fell again last night and had fallen 2 wks ago. She states she was seen in the ER for the first fall. Her T-12 was broken a long time ago and now her L1 is broken too. She states she needs something for pain. She is saying she needs something for pain. Her son states it hurts her to sit, stand or lay down and getting her in the car is an "act of congress".  Does the patient have any new or worsening symptoms? ---Yes  Will a triage be completed? ---Yes  Related visit to physician within the last 2 weeks? ---Yes  Does the PT have any chronic conditions? (i.e. diabetes, asthma, etc.) ---Yes  List chronic conditions. ---fractured back  Is this a behavioral health or substance abuse call? ---No     Guidelines    Guideline Title Affirmed Question Affirmed Notes  Back Injury Blood in urine (red, pink, or tea-colored)    Final Disposition User   Go to ED Now [place], RN, Marylene Landngela    Comments  Pt states she has a pain doctor but had to cancel her last appt. He won't call in pain meds without being seen. I explained to her that pain medication can not be called in when the office is closed and most pain meds, you have to have a hand written Rx. She states she will call office in am and refused to go to ER or call EMS (can't afford it). She states she will continue using ice and heat tonight and will call office in am.   Referrals  GO TO FACILITY REFUSED   Disagree/Comply: Comply

## 2016-11-01 NOTE — Telephone Encounter (Signed)
Patient Name: [name]  DOB: 05/16/1932    Initial Comment Caller states she fell a couple weeks ago. she's in a lot of pain in her back and has a low fever. She fell again last night.   Nurse Assessment  Nurse: Nicanor Bakeosta, RN, Marylene LandAngela Date/Time (Eastern Time): 11/01/2016 5:37:18 PM  Confirm and document reason for call. If symptomatic, describe symptoms. ---Pt has appt on Friday. She fell again last night and had fallen 2 wks ago. She states she was seen in the ER for the first fall. Her T-12 was broken a long time ago and now her L1 is broken too. She states she needs something for pain. She is saying she needs something for pain. Her son states it hurts her to sit, stand or lay down and getting her in the car is an "act of congress".  Does the patient have any new or worsening symptoms? ---Yes  Will a triage be completed? ---Yes  Related visit to physician within the last 2 weeks? ---Yes  Does the PT have any chronic conditions? (i.e. diabetes, asthma, etc.) ---Yes  List chronic conditions. ---fractured back  Is this a behavioral health or substance abuse call? ---No     Guidelines    Guideline Title Affirmed Question Affirmed Notes  Back Injury Blood in urine (red, pink, or tea-colored)    Final Disposition User   Go to ED Now [place], RN, Marylene Landngela    Comments  Pt states she has a pain doctor but had to cancel her last appt. He won't call in pain meds without being seen. I explained to her that pain medication can not be called in when the office is closed and most pain meds, you have to have a hand written Rx. She states she will call office in am and refused to go to ER or call EMS (can't afford it). She states she will continue using ice and heat tonight and will call office in am.   Referrals  GO TO FACILITY REFUSED   Disagree/Comply: Comply

## 2016-11-02 NOTE — Telephone Encounter (Signed)
Pt has appt with Dr Reece AgarG on 11/03/16 at 9.

## 2016-11-03 ENCOUNTER — Ambulatory Visit: Payer: Medicare Other | Admitting: Family Medicine

## 2016-11-03 ENCOUNTER — Telehealth: Payer: Self-pay | Admitting: Family Medicine

## 2016-11-03 NOTE — Telephone Encounter (Signed)
Patient did not come in for their appointment today for follow up Please let me know if patient needs to be contacted immediately for follow up or no follow up needed. Do you want to charge the NSF? °

## 2016-11-03 NOTE — Telephone Encounter (Signed)
Yes plz call to reschedule. See why pt missed appt. Ok to not charge no show fee.

## 2016-11-06 ENCOUNTER — Telehealth: Payer: Self-pay | Admitting: Family Medicine

## 2016-11-06 MED ORDER — NAPROXEN 500 MG PO TABS: 500.0000 mg | ORAL_TABLET | Freq: Two times a day (BID) | ORAL | 0 refills | Status: DC

## 2016-11-06 NOTE — Telephone Encounter (Signed)
Spoke to pt and advised per Dr Sharen HonesGutierrez; pt verbally expressed understanding. Pt is not wanting to reschedule appt at this time.

## 2016-11-06 NOTE — Telephone Encounter (Signed)
She missed her appt here Friday.  Stop aleve.  Take naprosyn 500mg  twice daily with meal for 5 days. May alternate this with tylenol 500mg  three times daily. If needs stronger pain medicine will need to go through her pain doctor.  rec she schedule f/u in office to review vertebral fracture and pain. plz schedule 30 min appt

## 2016-11-06 NOTE — Telephone Encounter (Signed)
Patient Name: Sherry GrumblingLIZABETH Thomas DOB: 03/10/1933 Initial Comment Caller states she feel three weeks ago and is having hip pain Nurse Assessment Nurse: Fransisco HertzKirkland, RN, Elnita Maxwellheryl Date/Time (Eastern Time): 11/06/2016 12:51:33 PM Confirm and document reason for call. If symptomatic, describe symptoms. ---Caller states she feel three weeks ago and is having hip pain. States she has fallen twice in 3 weeks. She went to have xray and has 2 vertebral fractures. Does the patient have any new or worsening symptoms? ---Yes Will a triage be completed? ---Yes Related visit to physician within the last 2 weeks? ---No Does the PT have any chronic conditions? (i.e. diabetes, asthma, etc.) ---Yes List chronic conditions. ---heart condition, hypertension Is this a behavioral health or substance abuse call? ---No Guidelines Guideline Title Affirmed Question Affirmed Notes Hip Pain Can't stand (bear weight) or walk Final Disposition User Go to ED Now Fransisco HertzKirkland, RN, Southwest AirlinesCheryl Comments Caller states she does not need to go to the ED. It is too far and she has already gone. Her MD has the xrays. She just needs something for the pain so that she can get to the MD office. She can't tolerate the pain long enough to get there. She is not asking for narcotic. She is taking Aleve and Excedrin right now. It doesn't help. I called and notified Triage at office and they requested that message be sent so that MD can evaluate. Patient is unable to stand, walk. Has been unable to eat for 3 weeks. Only drinking fluids and juice for the last 3 weeks. Referrals REFERRED TO PCP OFFICE Disagree/Comply: Disagree Disagree/Comply Reason: Unable to find transportation

## 2016-11-09 ENCOUNTER — Telehealth: Payer: Self-pay | Admitting: Family Medicine

## 2016-11-09 DIAGNOSIS — M79641 Pain in right hand: Secondary | ICD-10-CM | POA: Diagnosis not present

## 2016-11-09 DIAGNOSIS — S22009S Unspecified fracture of unspecified thoracic vertebra, sequela: Secondary | ICD-10-CM | POA: Diagnosis not present

## 2016-11-09 DIAGNOSIS — G894 Chronic pain syndrome: Secondary | ICD-10-CM | POA: Diagnosis not present

## 2016-11-09 DIAGNOSIS — Z79891 Long term (current) use of opiate analgesic: Secondary | ICD-10-CM | POA: Diagnosis not present

## 2016-11-09 NOTE — Telephone Encounter (Signed)
Pt's [facility] came in to [facility] Says she can't take Naproxen -- it makes her sick. She also has appt coming up for pain management. Went to ER last month and had xrays taken.  Wanted to inform pcp of what is going on before they come in for visit on 8/15.

## 2016-11-09 NOTE — Telephone Encounter (Signed)
Noted. Will see then.  

## 2016-11-09 NOTE — Telephone Encounter (Signed)
Dr.Phillips,Pain Management,called to speak to Dr.G about patient.  Dr.Phillips notified Dr. Reece AgarG is off today.  Dr.Phillips asked for Dr.G to call him back tomorrow.  The office closes at 1:00 on Friday.  He asked for Dr.G to tell whoever answers the phone to get Dr.Phillips out of a room.

## 2016-11-10 DIAGNOSIS — R531 Weakness: Secondary | ICD-10-CM | POA: Diagnosis not present

## 2016-11-10 DIAGNOSIS — Z7982 Long term (current) use of aspirin: Secondary | ICD-10-CM | POA: Diagnosis not present

## 2016-11-10 DIAGNOSIS — I4891 Unspecified atrial fibrillation: Secondary | ICD-10-CM | POA: Diagnosis not present

## 2016-11-10 DIAGNOSIS — E785 Hyperlipidemia, unspecified: Secondary | ICD-10-CM | POA: Diagnosis not present

## 2016-11-10 DIAGNOSIS — M545 Low back pain: Secondary | ICD-10-CM | POA: Diagnosis not present

## 2016-11-10 DIAGNOSIS — R0602 Shortness of breath: Secondary | ICD-10-CM | POA: Diagnosis not present

## 2016-11-10 DIAGNOSIS — R079 Chest pain, unspecified: Secondary | ICD-10-CM | POA: Diagnosis not present

## 2016-11-10 DIAGNOSIS — I493 Ventricular premature depolarization: Secondary | ICD-10-CM | POA: Diagnosis not present

## 2016-11-10 DIAGNOSIS — R062 Wheezing: Secondary | ICD-10-CM | POA: Diagnosis not present

## 2016-11-10 DIAGNOSIS — Z79899 Other long term (current) drug therapy: Secondary | ICD-10-CM | POA: Diagnosis not present

## 2016-11-10 DIAGNOSIS — N179 Acute kidney failure, unspecified: Secondary | ICD-10-CM | POA: Diagnosis not present

## 2016-11-10 DIAGNOSIS — N390 Urinary tract infection, site not specified: Secondary | ICD-10-CM | POA: Diagnosis not present

## 2016-11-10 DIAGNOSIS — S32010A Wedge compression fracture of first lumbar vertebra, initial encounter for closed fracture: Secondary | ICD-10-CM | POA: Diagnosis not present

## 2016-11-10 DIAGNOSIS — M810 Age-related osteoporosis without current pathological fracture: Secondary | ICD-10-CM | POA: Diagnosis not present

## 2016-11-10 DIAGNOSIS — S22080A Wedge compression fracture of T11-T12 vertebra, initial encounter for closed fracture: Secondary | ICD-10-CM | POA: Diagnosis not present

## 2016-11-10 DIAGNOSIS — I480 Paroxysmal atrial fibrillation: Secondary | ICD-10-CM | POA: Diagnosis not present

## 2016-11-10 DIAGNOSIS — R9431 Abnormal electrocardiogram [ECG] [EKG]: Secondary | ICD-10-CM | POA: Diagnosis not present

## 2016-11-10 DIAGNOSIS — I5032 Chronic diastolic (congestive) heart failure: Secondary | ICD-10-CM | POA: Diagnosis not present

## 2016-11-10 DIAGNOSIS — Z888 Allergy status to other drugs, medicaments and biological substances status: Secondary | ICD-10-CM | POA: Diagnosis not present

## 2016-11-10 DIAGNOSIS — I2510 Atherosclerotic heart disease of native coronary artery without angina pectoris: Secondary | ICD-10-CM | POA: Diagnosis not present

## 2016-11-10 DIAGNOSIS — M4856XA Collapsed vertebra, not elsewhere classified, lumbar region, initial encounter for fracture: Secondary | ICD-10-CM | POA: Diagnosis not present

## 2016-11-10 DIAGNOSIS — I110 Hypertensive heart disease with heart failure: Secondary | ICD-10-CM | POA: Diagnosis not present

## 2016-11-10 DIAGNOSIS — M1990 Unspecified osteoarthritis, unspecified site: Secondary | ICD-10-CM | POA: Diagnosis not present

## 2016-11-10 DIAGNOSIS — R0789 Other chest pain: Secondary | ICD-10-CM | POA: Diagnosis not present

## 2016-11-10 NOTE — Telephone Encounter (Signed)
Contacted Dr. Vear ClockPhillips. Dr. Sharen HonesGutierrez spoke with Dr. Aneta MinsPhillip regarding Rash.

## 2016-11-10 NOTE — Telephone Encounter (Signed)
Can you remove no show fee per provider?

## 2016-11-10 NOTE — Telephone Encounter (Signed)
Let's try and touch base with Dr Vear ClockPhillips sometime this morning. Thanks.

## 2016-11-11 DIAGNOSIS — M545 Low back pain: Secondary | ICD-10-CM | POA: Diagnosis not present

## 2016-11-11 DIAGNOSIS — E785 Hyperlipidemia, unspecified: Secondary | ICD-10-CM | POA: Diagnosis not present

## 2016-11-11 DIAGNOSIS — I480 Paroxysmal atrial fibrillation: Secondary | ICD-10-CM | POA: Diagnosis not present

## 2016-11-11 DIAGNOSIS — R079 Chest pain, unspecified: Secondary | ICD-10-CM | POA: Diagnosis not present

## 2016-11-11 NOTE — Telephone Encounter (Signed)
Late entry - spoke with Dr Vear ClockPhillips - some concerns with diversion of narcotics at home. Ongoing syncope, worsening rash, new L1 compression fracture after fall last month with ongoing pain. Recommended vertebroplasty eval by Dr Corliss Skainseveshwar. Will discuss with patient when she comes for OV this coming week.

## 2016-11-12 DIAGNOSIS — I5032 Chronic diastolic (congestive) heart failure: Secondary | ICD-10-CM | POA: Diagnosis not present

## 2016-11-12 DIAGNOSIS — M545 Low back pain: Secondary | ICD-10-CM | POA: Diagnosis not present

## 2016-11-12 DIAGNOSIS — J449 Chronic obstructive pulmonary disease, unspecified: Secondary | ICD-10-CM | POA: Diagnosis not present

## 2016-11-12 DIAGNOSIS — E785 Hyperlipidemia, unspecified: Secondary | ICD-10-CM | POA: Diagnosis not present

## 2016-11-12 DIAGNOSIS — R079 Chest pain, unspecified: Secondary | ICD-10-CM | POA: Diagnosis not present

## 2016-11-12 DIAGNOSIS — I480 Paroxysmal atrial fibrillation: Secondary | ICD-10-CM | POA: Diagnosis not present

## 2016-11-12 DIAGNOSIS — S32010S Wedge compression fracture of first lumbar vertebra, sequela: Secondary | ICD-10-CM | POA: Diagnosis not present

## 2016-11-13 ENCOUNTER — Other Ambulatory Visit: Payer: Self-pay | Admitting: Cardiology

## 2016-11-14 ENCOUNTER — Telehealth: Payer: Self-pay

## 2016-11-14 MED ORDER — FUROSEMIDE 20 MG PO TABS: 10.0000 mg | ORAL_TABLET | Freq: Every day | ORAL | 2 refills | Status: DC

## 2016-11-14 NOTE — Telephone Encounter (Signed)
Erie NoeVanessa with [facility] left v/m requesting cb to see if will follow pt after [facility].

## 2016-11-14 NOTE — Telephone Encounter (Signed)
[name] currently on lunch; spoke to cheryl and advised per Dr GReece Agar

## 2016-11-14 NOTE — Addendum Note (Signed)
Addended by: Alyson InglesBROOME, Lucciana Head L on: 11/14/2016 08:13 AM   Modules accepted: Orders

## 2016-11-14 NOTE — Telephone Encounter (Signed)
ok to do this thanks. 

## 2016-11-15 ENCOUNTER — Ambulatory Visit (INDEPENDENT_AMBULATORY_CARE_PROVIDER_SITE_OTHER): Payer: Medicare Other | Admitting: Family Medicine

## 2016-11-15 ENCOUNTER — Emergency Department (HOSPITAL_COMMUNITY): Payer: Medicare Other

## 2016-11-15 ENCOUNTER — Encounter: Payer: Self-pay | Admitting: Family Medicine

## 2016-11-15 ENCOUNTER — Observation Stay (HOSPITAL_COMMUNITY): Payer: Medicare Other

## 2016-11-15 ENCOUNTER — Inpatient Hospital Stay (HOSPITAL_COMMUNITY)
Admission: EM | Admit: 2016-11-15 | Discharge: 2016-11-18 | DRG: 516 | Disposition: A | Discharge status: Home Health | Payer: Medicare Other | Attending: Family Medicine | Admitting: Family Medicine

## 2016-11-15 ENCOUNTER — Encounter (HOSPITAL_COMMUNITY): Payer: Self-pay | Admitting: *Deleted

## 2016-11-15 VITALS — BP 138/80 | HR 114 | Temp 97.5°F

## 2016-11-15 DIAGNOSIS — Z91041 Radiographic dye allergy status: Secondary | ICD-10-CM

## 2016-11-15 DIAGNOSIS — Z955 Presence of coronary angioplasty implant and graft: Secondary | ICD-10-CM

## 2016-11-15 DIAGNOSIS — R112 Nausea with vomiting, unspecified: Secondary | ICD-10-CM | POA: Diagnosis not present

## 2016-11-15 DIAGNOSIS — G894 Chronic pain syndrome: Secondary | ICD-10-CM | POA: Diagnosis not present

## 2016-11-15 DIAGNOSIS — M4856XA Collapsed vertebra, not elsewhere classified, lumbar region, initial encounter for fracture: Principal | ICD-10-CM | POA: Diagnosis present

## 2016-11-15 DIAGNOSIS — M545 Low back pain, unspecified: Secondary | ICD-10-CM | POA: Diagnosis present

## 2016-11-15 DIAGNOSIS — R7303 Prediabetes: Secondary | ICD-10-CM | POA: Diagnosis not present

## 2016-11-15 DIAGNOSIS — R296 Repeated falls: Secondary | ICD-10-CM

## 2016-11-15 DIAGNOSIS — Z888 Allergy status to other drugs, medicaments and biological substances status: Secondary | ICD-10-CM | POA: Diagnosis not present

## 2016-11-15 DIAGNOSIS — I482 Chronic atrial fibrillation, unspecified: Secondary | ICD-10-CM

## 2016-11-15 DIAGNOSIS — S32010D Wedge compression fracture of first lumbar vertebra, subsequent encounter for fracture with routine healing: Secondary | ICD-10-CM | POA: Diagnosis not present

## 2016-11-15 DIAGNOSIS — M1990 Unspecified osteoarthritis, unspecified site: Secondary | ICD-10-CM | POA: Diagnosis present

## 2016-11-15 DIAGNOSIS — R911 Solitary pulmonary nodule: Secondary | ICD-10-CM | POA: Diagnosis not present

## 2016-11-15 DIAGNOSIS — I481 Persistent atrial fibrillation: Secondary | ICD-10-CM | POA: Diagnosis not present

## 2016-11-15 DIAGNOSIS — R1110 Vomiting, unspecified: Secondary | ICD-10-CM | POA: Diagnosis present

## 2016-11-15 DIAGNOSIS — R109 Unspecified abdominal pain: Secondary | ICD-10-CM

## 2016-11-15 DIAGNOSIS — G629 Polyneuropathy, unspecified: Secondary | ICD-10-CM | POA: Diagnosis present

## 2016-11-15 DIAGNOSIS — F418 Other specified anxiety disorders: Secondary | ICD-10-CM | POA: Diagnosis not present

## 2016-11-15 DIAGNOSIS — I10 Essential (primary) hypertension: Secondary | ICD-10-CM | POA: Diagnosis present

## 2016-11-15 DIAGNOSIS — Z7982 Long term (current) use of aspirin: Secondary | ICD-10-CM

## 2016-11-15 DIAGNOSIS — E785 Hyperlipidemia, unspecified: Secondary | ICD-10-CM | POA: Diagnosis present

## 2016-11-15 DIAGNOSIS — I5032 Chronic diastolic (congestive) heart failure: Secondary | ICD-10-CM | POA: Diagnosis not present

## 2016-11-15 DIAGNOSIS — Z791 Long term (current) use of non-steroidal anti-inflammatories (NSAID): Secondary | ICD-10-CM

## 2016-11-15 DIAGNOSIS — I110 Hypertensive heart disease with heart failure: Secondary | ICD-10-CM | POA: Diagnosis present

## 2016-11-15 DIAGNOSIS — F329 Major depressive disorder, single episode, unspecified: Secondary | ICD-10-CM | POA: Diagnosis present

## 2016-11-15 DIAGNOSIS — S299XXA Unspecified injury of thorax, initial encounter: Secondary | ICD-10-CM | POA: Diagnosis not present

## 2016-11-15 DIAGNOSIS — M25559 Pain in unspecified hip: Secondary | ICD-10-CM | POA: Diagnosis not present

## 2016-11-15 DIAGNOSIS — S79911A Unspecified injury of right hip, initial encounter: Secondary | ICD-10-CM | POA: Diagnosis not present

## 2016-11-15 DIAGNOSIS — J849 Interstitial pulmonary disease, unspecified: Secondary | ICD-10-CM | POA: Diagnosis not present

## 2016-11-15 DIAGNOSIS — G8929 Other chronic pain: Secondary | ICD-10-CM | POA: Diagnosis present

## 2016-11-15 DIAGNOSIS — I2510 Atherosclerotic heart disease of native coronary artery without angina pectoris: Secondary | ICD-10-CM | POA: Diagnosis present

## 2016-11-15 DIAGNOSIS — M25551 Pain in right hip: Secondary | ICD-10-CM | POA: Diagnosis not present

## 2016-11-15 DIAGNOSIS — Z87440 Personal history of urinary (tract) infections: Secondary | ICD-10-CM | POA: Diagnosis not present

## 2016-11-15 DIAGNOSIS — S32010A Wedge compression fracture of first lumbar vertebra, initial encounter for closed fracture: Secondary | ICD-10-CM

## 2016-11-15 DIAGNOSIS — I4819 Other persistent atrial fibrillation: Secondary | ICD-10-CM

## 2016-11-15 DIAGNOSIS — Z8249 Family history of ischemic heart disease and other diseases of the circulatory system: Secondary | ICD-10-CM | POA: Diagnosis not present

## 2016-11-15 DIAGNOSIS — I4891 Unspecified atrial fibrillation: Secondary | ICD-10-CM | POA: Diagnosis present

## 2016-11-15 DIAGNOSIS — Z9109 Other allergy status, other than to drugs and biological substances: Secondary | ICD-10-CM

## 2016-11-15 DIAGNOSIS — M109 Gout, unspecified: Secondary | ICD-10-CM | POA: Diagnosis present

## 2016-11-15 DIAGNOSIS — E876 Hypokalemia: Secondary | ICD-10-CM | POA: Diagnosis present

## 2016-11-15 DIAGNOSIS — M549 Dorsalgia, unspecified: Secondary | ICD-10-CM | POA: Diagnosis not present

## 2016-11-15 DIAGNOSIS — F419 Anxiety disorder, unspecified: Secondary | ICD-10-CM | POA: Diagnosis present

## 2016-11-15 DIAGNOSIS — Z79899 Other long term (current) drug therapy: Secondary | ICD-10-CM

## 2016-11-15 LAB — PROTIME-INR
INR: 0.96
Prothrombin Time: 12.7 seconds (ref 11.4–15.2)

## 2016-11-15 LAB — DIGOXIN LEVEL: DIGOXIN LVL: 0.8 ng/mL (ref 0.8–2.0)

## 2016-11-15 LAB — CBC
HEMATOCRIT: 36 % (ref 36.0–46.0)
Hemoglobin: 12.2 g/dL (ref 12.0–15.0)
MCH: 31.5 pg (ref 26.0–34.0)
MCHC: 33.9 g/dL (ref 30.0–36.0)
MCV: 93 fL (ref 78.0–100.0)
PLATELETS: 518 10*3/uL — AB (ref 150–400)
RBC: 3.87 MIL/uL (ref 3.87–5.11)
RDW: 15.8 % — AB (ref 11.5–15.5)
WBC: 10.2 10*3/uL (ref 4.0–10.5)

## 2016-11-15 LAB — COMPREHENSIVE METABOLIC PANEL
ALBUMIN: 3 g/dL — AB (ref 3.5–5.0)
ALT: 16 U/L (ref 14–54)
AST: 27 U/L (ref 15–41)
Alkaline Phosphatase: 116 U/L (ref 38–126)
Anion gap: 15 (ref 5–15)
BUN: 8 mg/dL (ref 6–20)
CHLORIDE: 104 mmol/L (ref 101–111)
CO2: 20 mmol/L — AB (ref 22–32)
CREATININE: 1.08 mg/dL — AB (ref 0.44–1.00)
Calcium: 8.9 mg/dL (ref 8.9–10.3)
GFR calc Af Amer: 54 mL/min — ABNORMAL LOW (ref >60)
GFR calc non Af Amer: 46 mL/min — ABNORMAL LOW (ref >60)
Glucose, Bld: 109 mg/dL — ABNORMAL HIGH (ref 65–99)
POTASSIUM: 3 mmol/L — AB (ref 3.5–5.1)
SODIUM: 139 mmol/L (ref 135–145)
Total Bilirubin: 1.6 mg/dL — ABNORMAL HIGH (ref 0.3–1.2)
Total Protein: 6.3 g/dL — ABNORMAL LOW (ref 6.5–8.1)

## 2016-11-15 LAB — LIPASE, BLOOD: LIPASE: 23 U/L (ref 11–51)

## 2016-11-15 LAB — TROPONIN I
Troponin I: 0.03 ng/mL (ref <0.03)
Troponin I: <0.03 ng/mL (ref <0.03)

## 2016-11-15 LAB — I-STAT TROPONIN, ED: TROPONIN I, POC: 0.03 ng/mL (ref 0.00–0.08)

## 2016-11-15 MED ORDER — ONDANSETRON HCL 4 MG/2ML IJ SOLN
4.0000 mg | Freq: Once | INTRAMUSCULAR | Status: AC
  Administered 2016-11-15: 4 mg via INTRAVENOUS

## 2016-11-15 MED ORDER — CLOTRIMAZOLE 1 % EX CREA
TOPICAL_CREAM | Freq: Two times a day (BID) | CUTANEOUS | Status: DC
  Administered 2016-11-16 – 2016-11-17 (×3): via TOPICAL
  Filled 2016-11-15 (×2): qty 15

## 2016-11-15 MED ORDER — PROMETHAZINE HCL 25 MG PO TABS: 12.5000 mg | ORAL_TABLET | Freq: Four times a day (QID) | ORAL | Status: DC | PRN

## 2016-11-15 MED ORDER — POTASSIUM CHLORIDE 10 MEQ/100ML IV SOLN
10.0000 meq | Freq: Once | INTRAVENOUS | Status: AC
  Administered 2016-11-15: 10 meq via INTRAVENOUS
  Filled 2016-11-15: qty 100

## 2016-11-15 MED ORDER — ACETAMINOPHEN 650 MG RE SUPP: 650.0000 mg | Freq: Four times a day (QID) | RECTAL | Status: DC | PRN

## 2016-11-15 MED ORDER — DIGOXIN 125 MCG PO TABS
125.0000 ug | ORAL_TABLET | Freq: Every day | ORAL | Status: DC
  Administered 2016-11-16 – 2016-11-17 (×2): 125 ug via ORAL
  Filled 2016-11-15 (×3): qty 1

## 2016-11-15 MED ORDER — MORPHINE SULFATE (PF) 4 MG/ML IV SOLN
4.0000 mg | Freq: Once | INTRAVENOUS | Status: AC
  Administered 2016-11-15: 4 mg via INTRAVENOUS
  Filled 2016-11-15: qty 1

## 2016-11-15 MED ORDER — SODIUM CHLORIDE 0.9 % IV SOLN
INTRAVENOUS | Status: DC
  Administered 2016-11-15 – 2016-11-17 (×3): via INTRAVENOUS

## 2016-11-15 MED ORDER — SODIUM CHLORIDE 0.9 % IV BOLUS (SEPSIS)
1000.0000 mL | Freq: Once | INTRAVENOUS | Status: AC
  Administered 2016-11-15: 1000 mL via INTRAVENOUS

## 2016-11-15 MED ORDER — PANTOPRAZOLE SODIUM 40 MG IV SOLR
40.0000 mg | Freq: Once | INTRAVENOUS | Status: AC
  Administered 2016-11-15: 40 mg via INTRAVENOUS
  Filled 2016-11-15: qty 40

## 2016-11-15 MED ORDER — FLUOXETINE HCL 20 MG PO CAPS
20.0000 mg | ORAL_CAPSULE | Freq: Every day | ORAL | Status: DC
  Administered 2016-11-16 – 2016-11-18 (×3): 20 mg via ORAL
  Filled 2016-11-15 (×3): qty 1

## 2016-11-15 MED ORDER — ENOXAPARIN SODIUM 40 MG/0.4ML ~~LOC~~ SOLN
40.0000 mg | SUBCUTANEOUS | Status: DC
  Administered 2016-11-16 – 2016-11-17 (×2): 40 mg via SUBCUTANEOUS
  Filled 2016-11-15 (×2): qty 0.4

## 2016-11-15 MED ORDER — PROMETHAZINE HCL 25 MG/ML IJ SOLN
12.5000 mg | Freq: Once | INTRAMUSCULAR | Status: AC
  Administered 2016-11-15: 12.5 mg via INTRAVENOUS
  Filled 2016-11-15: qty 1

## 2016-11-15 MED ORDER — ONDANSETRON HCL 4 MG/2ML IJ SOLN
INTRAMUSCULAR | Status: AC
  Filled 2016-11-15: qty 2

## 2016-11-15 MED ORDER — ACETAMINOPHEN 325 MG PO TABS: 650.0000 mg | ORAL_TABLET | Freq: Four times a day (QID) | ORAL | Status: DC | PRN

## 2016-11-15 MED ORDER — METOPROLOL TARTRATE 25 MG PO TABS
25.0000 mg | ORAL_TABLET | Freq: Two times a day (BID) | ORAL | Status: DC
  Administered 2016-11-15 – 2016-11-17 (×4): 25 mg via ORAL
  Filled 2016-11-15 (×6): qty 1

## 2016-11-15 MED ORDER — POTASSIUM CHLORIDE 10 MEQ/100ML IV SOLN
10.0000 meq | INTRAVENOUS | Status: AC
  Administered 2016-11-15: 10 meq via INTRAVENOUS
  Filled 2016-11-15: qty 100

## 2016-11-15 MED ORDER — POTASSIUM CHLORIDE CRYS ER 20 MEQ PO TBCR
40.0000 meq | EXTENDED_RELEASE_TABLET | Freq: Once | ORAL | Status: AC
  Administered 2016-11-15: 40 meq via ORAL
  Filled 2016-11-15: qty 2

## 2016-11-15 MED ORDER — SODIUM CHLORIDE 0.9 % IV SOLN
Freq: Once | INTRAVENOUS | Status: AC
  Administered 2016-11-15: 13:00:00 via INTRAVENOUS

## 2016-11-15 MED ORDER — ALLOPURINOL 100 MG PO TABS
100.0000 mg | ORAL_TABLET | Freq: Every day | ORAL | Status: DC
  Administered 2016-11-16 – 2016-11-18 (×3): 100 mg via ORAL
  Filled 2016-11-15 (×3): qty 1

## 2016-11-15 MED ORDER — OXYCODONE HCL 5 MG PO TABS: 5.0000 mg | ORAL_TABLET | ORAL | Status: DC | PRN

## 2016-11-15 NOTE — ED Provider Notes (Signed)
[facility]EMER[facility]ENCY DEPT Provider Note   CSN: [id number] Arrival date & time: 11/15/16  1101     History   Chief Complaint Chief Complaint  Patient presents with  . Back Pain  . Emesis    HPI [name] is a 83 y.o. female.  HPI Has had constant episodes of dry heaves over the past 5 days. Patient reports that she has not been able to eat anything. She reports there is no food coming up only stomach juices. She denies she is having abdominal pain. She reports the pain she is having and has been having is in her right hip. She notes she has a compression fracture in the lumbar spine. She states that they are evaluating that for a procedure. She reports that actually is not hurting her too badly. She is able to walk and bear weight. She reports she however does not have anything to take for the pain and the pain in her hip is fairly bad. The patient has been having recurrent falls but denies that she's had any fall recently that she believes injured hip. The patient was seen in her physician's office today, Dr. Eustaquio Boyden, while the office she had an episode of retching and nausea that could not be abated with Zofran. She was referred to the emergency department for further diagnostic evaluation and treatment. Past Medical History:  Diagnosis Date  . Arthritis   . Atherosclerosis of abdominal aorta (HCC)    by xray  . Atrial fibrillation (HCC)   . CAD (coronary artery disease)    95% LAD stenosis, [place] x 02 September 1996  . Cervicalgia   . CHF (congestive heart failure) (HCC) 2014   on recent hospitalization  . Chronic interstitial lung disease (HCC) 08/2015   by CXR  . Depression with anxiety    celexa started 04/2010  . Dyslipidemia   . Facial trauma 06/01/2014   Fall 05/2014 without fracture by facial/head CT   . Gout    prior PCP stopped allopurinol  . Hyperlipidemia   . Hypertension   . LBP (low back pain) 2005   spinal stenosis and bulging discs at L2-L5 s/p lumbar  laminectomy [facility])  . Lymphocytic colitis 10/28/2013  . Osteopenia 07/2013   T score -2.2 L femur  . Peripheral neuropathy since feb 2015   right arm   . Prediabetes 2001  . Syncopal episodes 2015   orthostatic, normal EEG and neuro eval ([place])  . T12 compression fracture (HCC) 2015   by CT    Patient Active Problem List   Diagnosis Date Noted  . Intractable nausea and vomiting 11/15/2016  . Lesion of right lung 11/15/2016  . Closed compression fracture of L1 lumbar vertebra (HCC) 10/25/2016  . Chronic pain 09/08/2016  . Health maintenance examination 08/29/2016  . Carotid stenosis, bilateral 08/19/2016  . Vaginal itching 08/01/2016  . UTI (urinary tract infection) 07/01/2016  . Cellulitis of finger of right hand 02/04/2016  . Memory impairment 10/04/2015  . Chronic interstitial lung disease (HCC) 08/02/2015  . Advanced care planning/counseling discussion 04/14/2015  . Recurrent falls 04/14/2015  . Right shoulder pain 09/24/2014  . Candidal vulvovaginitis 05/26/2014  . Chronic diastolic CHF (congestive heart failure) (HCC)   . Syncope and collapse 01/01/2014  . Rib pain on left side 10/28/2013  . Lymphocytic colitis 10/28/2013  . Loss of weight 08/18/2013  . Medicare annual wellness visit, subsequent 06/11/2013  . Depression with anxiety   . Hypothyroidism 06/04/2013  . Atrial fibrillation (  HCC)   . Peripheral neuropathy   . Osteopenia   . Low back pain   . Gout   . Cramping of feet 07/19/2010  . Palpitations 07/19/2010  . EDEMA 01/19/2009  . HLD (hyperlipidemia) 07/02/2008  . Essential hypertension, benign 07/02/2008  . Coronary atherosclerosis 07/02/2008  . Rash and nonspecific skin eruption 07/02/2008    Past Surgical History:  Procedure Laterality Date  . CARDIAC CATHETERIZATION    . CATARACT EXTRACTION Bilateral   . COLONOSCOPY WITH PROPOFOL N/A 10/16/2013   Rachael Fee, MD  . DEXA  07/2013   T score -2.2 L femur  . INCISION AND DRAINAGE Right  02/06/2016   Procedure: INCISION AND DRAINAGE right middle finger;  Surgeon: Juanell Fairly, MD;  Location: [facility] ORS;  Service: Orthopedics;  Laterality: Right;  . LUMBAR LAMINECTOMY  2005   Dr. Dutch Quint  . PERCUTANEOUS CORONARY STENT INTERVENTION (PCI-S)     stent x3 in heart  . TONSILLECTOMY    . TUBAL LIGATION      OB History    Gravida Para Term Preterm AB Living   1 1 1     1    SAB TAB Ectopic Multiple Live Births           1       Home Medications    Prior to Admission medications   Medication Sig Start Date End Date Taking? Authorizing Provider  aspirin EC 81 MG tablet Take 1 tablet (81 mg total) by mouth daily. 04/14/15  Yes Eustaquio Boyden, MD  atorvastatin (LIPITOR) 40 MG tablet Take 40 mg by mouth daily.   Yes [provider]  digoxin (LANOXIN) 0.125 MG tablet TAKE ONE TABLET BY MOUTH EVERY DAY 10/09/16  Yes Eustaquio Boyden, MD  FLUoxetine (PROZAC) 20 MG capsule Take 20 mg by mouth daily.   Yes [provider]  furosemide (LASIX) 20 MG tablet Take 0.5 tablets (10 mg total) by mouth daily. Patient taking differently: Take 10 mg by mouth as needed.  11/14/16  Yes Rollene Rotunda, MD  metoprolol tartrate (LOPRESSOR) 25 MG tablet TAKE ONE TABLET BY MOUTH TWICE DAILY 06/19/16  Yes Eustaquio Boyden, MD  Multiple Vitamins-Minerals (ONE-A-DAY WOMENS 50+ ADVANTAGE) TABS Take 1 tablet by mouth daily.    Yes [provider]  nystatin cream (MYCOSTATIN) APPLY TOPICALLY TWICE A DAY 08/17/16  Yes Eustaquio Boyden, MD  clotrimazole-betamethasone (LOTRISONE) cream Apply 1 application topically 2 (two) times daily. Apply externally BID for 2 wks 08/16/16   Copland, Alicia B, PA-C  nitroGLYCERIN (NITROSTAT) 0.4 MG SL tablet Place 0.4 mg under the tongue every 5 (five) minutes as needed for chest pain.    [provider]    Family History Family History  Problem Relation Age of Onset  . Prostate cancer Father   . Breast cancer Sister 7  . Heart  disease Brother   . Heart disease Other        First degree relatives with heart disease but later onset  . Heart disease Sister        pacer  . Esophageal cancer Neg Hx   . Colon cancer Neg Hx     Social History Social History  Substance Use Topics  . Smoking status: Never Smoker  . Smokeless tobacco: Never Used  . Alcohol use Yes     Comment: Occasionally drinks beer     Allergies   Coumadin [warfarin sodium]; Adhesive [tape]; Ivp dye [iodinated diagnostic agents]; Vytorin [ezetimibe-simvastatin]; and Iodides   Review  of Systems Review of Systems 10 Systems reviewed and are negative for acute change except as noted in the HPI.   Physical Exam Updated Vital Signs BP (!) 187/95 (BP Location: Right Arm)   Pulse (!) 111   Temp 97.6 F (36.4 C) (Oral)   Resp 18   SpO2 98%   Physical Exam  Constitutional: She is oriented to person, place, and time.  Patient is alert and nontoxic. She does not have respiratory distress at rest. Her mental status clear.  HENT:  Head: Normocephalic and atraumatic.  Nose: Nose normal.  Mucous membranes slightly dry.  Eyes: Conjunctivae and EOM are normal.  Neck: Neck supple.  Cardiovascular:  Irregularly irregular. Rate 80s to 90s on the monitor.  Pulmonary/Chest: Effort normal and breath sounds normal.  Abdominal: Soft. Bowel sounds are normal. She exhibits no distension. There is no tenderness. There is no guarding.  Musculoskeletal: Normal range of motion. She exhibits no edema or deformity.  Visual inspection of the back is normal appearance without any evident contusions or abrasions. Patient does endorse some discomfort to palpation at the very low lumbar spine and indicates predominantly an area of pain on the lateral hip and the right SI joint region. Patient has intact range of motion of both lower extremities. She can elevate each off the bed and hold against resistance. Patient is able to spontaneously ambulate with minimal  appearance of antalgia with ambulation. Her gait is steady.  Neurological: She is alert and oriented to person, place, and time. No cranial nerve deficit. She exhibits normal muscle tone. Coordination normal.  Skin: Skin is warm and dry.  Patient has a fairly even distribution of small, 2-3 mm round scabbed excoriations over much of her skin service. none of them show signs of secondary infection.  Psychiatric: She has a normal mood and affect.     ED Treatments / Results  Labs (all labs ordered are listed, but only abnormal results are displayed) Labs Reviewed  COMPREHENSIVE METABOLIC PANEL - Abnormal; Notable for the following:       Result Value   Potassium 3.0 (*)    CO2 20 (*)    Glucose, Bld 109 (*)    Creatinine, Ser 1.08 (*)    Total Protein 6.3 (*)    Albumin 3.0 (*)    Total Bilirubin 1.6 (*)    GFR calc non Af Amer 46 (*)    GFR calc Af Amer 54 (*)    All other components within normal limits  CBC - Abnormal; Notable for the following:    RDW 15.8 (*)    Platelets 518 (*)    All other components within normal limits  TROPONIN I - Abnormal; Notable for the following:    Troponin I 0.03 (*)    All other components within normal limits  LIPASE, BLOOD  PROTIME-INR  URINALYSIS, ROUTINE W REFLEX MICROSCOPIC  DIGOXIN LEVEL  I-STAT TROPONIN, ED  I-STAT TROPONIN, ED    EKG  EKG Interpretation None       Radiology Dg Chest 2 View  Result Date: 11/15/2016 CLINICAL DATA:  Severe back pain since a recent fall on approximately 10/22/2016. EXAM: CHEST  2 VIEW COMPARISON:  PA and lateral chest 08/03/2015. Lumbar spine plain films 10/24/2016. FINDINGS: Peripheral fibrotic change bilaterally is stable in appearance. No consolidative process, pneumothorax or effusion. Heart size is normal. Aortic atherosclerosis is seen. Remote T12 compression fracture is identified. The patient also has an L1 compression fracture which is seen on  the prior lumbar spine plain films. The  degree of compression has worsened since the prior plain films and the patient now has a vertebral plana deformity anteriorly. IMPRESSION: Worsened appearance of an L1 compression fracture since the recent lumbar spine plain films with vertebra plana deformity now present. Chronic, mild T12 superior endplate compression fracture. No acute cardiopulmonary disease with changes of pulmonary fibrosis noted. Aortic atherosclerosis. Electronically Signed   By: Drusilla Kanner M.D.   On: 11/15/2016 15:07   Dg Lumbar Spine 2-3 Views  Result Date: 11/15/2016 CLINICAL DATA:  Recent fall with low back pain, initial encounter EXAM: LUMBAR SPINE - 2-3 VIEW COMPARISON:  10/24/2016 FINDINGS: Stable T12 compression deformity is noted. The L1 compression deformity seen on the recent exam has increased by approximately 50% height loss. No other deformity is noted. Mild osteophytic changes are seen. Diffuse aortic calcifications are noted without aneurysmal dilatation. IMPRESSION: Increase in L1 compression deformity. Electronically Signed   By: Alcide Clever M.D.   On: 11/15/2016 15:51   Dg Hip Unilat With Pelvis 2-3 Views Right  Result Date: 11/15/2016 CLINICAL DATA:  Recent fall with back pain and hip pain, initial encounter EXAM: DG HIP (WITH OR WITHOUT PELVIS) 2-3V RIGHT COMPARISON:  None. FINDINGS: The pelvic ring is intact. Degenerative changes in the lumbar spine and hip joints are seen. No acute fracture or dislocation is noted. IMPRESSION: No acute abnormality noted. Electronically Signed   By: Alcide Clever M.D.   On: 11/15/2016 15:52    Procedures Procedures (including critical care time)  Medications Ordered in ED Medications  ondansetron (ZOFRAN) 4 MG/2ML injection (not administered)  promethazine (PHENERGAN) injection 12.5 mg (not administered)  pantoprazole (PROTONIX) injection 40 mg (not administered)  potassium chloride 10 mEq in 100 mL IVPB (not administered)  0.9 %  sodium chloride infusion (  Intravenous New Bag/Given 11/15/16 1323)  morphine 4 MG/ML injection 4 mg (4 mg Intravenous Given 11/15/16 1513)  potassium chloride SA (K-DUR,KLOR-CON) CR tablet 40 mEq (40 mEq Oral Given 11/15/16 1510)  ondansetron (ZOFRAN) injection 4 mg (4 mg Intravenous Given 11/15/16 1521)  sodium chloride 0.9 % bolus 1,000 mL (1,000 mLs Intravenous New Bag/Given 11/15/16 1610)     Initial Impression / Assessment and Plan / ED Course  I have reviewed the triage vital signs and the nursing notes.  Pertinent labs & imaging results that were available during my care of the patient were reviewed by me and considered in my medical decision making (see chart for details).    Consult: Review Dr. Sharen Hones. He described the episode at the office today. Will make disposition based on ED findings and patient's response to treatment. Consult: [facility] hospitalist for admission.  Final Clinical Impressions(s) / ED Diagnoses   Final diagnoses:  Hip pain  Intractable vomiting with nausea, unspecified vomiting type  Chronic a-fib (HCC)  Hypokalemia  Patient has multiple medical problems. She is predominantly to the emergency department for intractable nausea and vomiting. He has known chronic atrial fibrillation managed on daily aspirin. Patient is rate controlled and not presenting with strokelike symptoms. She has been having chronic pain problems relating to compression fracture and also reportedly hip pain. At this time there do not appear to be acute findings regarding her musculoskeletal problems. Patient is ambulatory and neurologically intact. She has however had episode of recurrent dry heaves and vomiting up of thin, bilious material. Patient does not have abdominal pain. Etiology of vomiting unclear. Patient is mildly hypokalemic. Plan at this time will  be to admit for hydration and potassium replacement with further diagnostic evaluation is needed for recurrent emesis.  New Prescriptions New Prescriptions   No  medications on file     Arby BarrettePfeiffer, Zuha Dejonge, MD 11/15/16 [phone]

## 2016-11-15 NOTE — Assessment & Plan Note (Signed)
Newly found [facility]. Will need f/u imaging once stabilized. Discussed with pt and son.

## 2016-11-15 NOTE — Assessment & Plan Note (Signed)
Of 5 days duration, not improved after zofran 4mg  in office. Anticipate recent naprosyn and aleve (she was taking this in addition) and aspirin exacerbating gastritis/GERD leading to current symptoms. afib flare as well.  [facility] with [facility]. Anticipate will need stabilization at ER with IV fluids and further evaluation so recommended ER eval today. I called and spoke with charge nurse. rec f/u in office after hospital eval.  Pt and son agree.

## 2016-11-15 NOTE — ED Triage Notes (Signed)
Pt sent here from pcp. Pt reports having a fall recently and having severe back pain, has been seen for the fall since it occurred. Reports now having n/v and loose stools x 4 days. Was given nausea meds at pcp office.

## 2016-11-15 NOTE — Assessment & Plan Note (Signed)
AC relatively contraindicated due to recurrent falls. She is only on aspirin 81mg  daily.

## 2016-11-15 NOTE — Assessment & Plan Note (Signed)
New - acutely worse in interim from xray last month to this month - mild to severe compression fracture. I and pain management Dr Vear ClockPhillips had discussed referral for possible vertebral augmentation but will need to first resolve current nausea/vomiting and stabilize patient.  She recently did have oxycodone decreased from 10mg  TID to 5mg  TID.

## 2016-11-15 NOTE — Patient Instructions (Signed)
Go to [facility] ER - we will let them know you're on your way.

## 2016-11-15 NOTE — H&P (Signed)
[facility] History and Physical  [name] WJX:[id number] DOB: 19-Dec-1932 DOA: 11/15/2016  Referring physician: Donnald Garre, MD PCP: Eustaquio Boyden, MD   Chief Complaint: intractable vomiting  HPI: [name] is a 83 y.o. female with hx afib, CAD, dyslipidemia, depression, anxiety, chronic back pain secondary to known L1 compression fx being managed by pain clinic and PCP. Pt presented to PCP today with intractable vomiting x 4-5 days. PCP note reports recent hospitalization at [facility] 8/10 through 11/12/16 d/t ongoing back pain. Hospital course complicated by UTI and pt tx'd with Rocephin. Urine cx negative. Pt is poor historian and uncertain if she was d/c'd on antibiotic. Son is not present on admission evaluation. She denies any pain with urination or urinary frequency. She has been on several recent antibiotics for right forearm cellulitis. Pt was recently tried on Naprosyn for chronic pain that was not tolerated well. Per PCP note, Oxycodone recently weaned from 10mg  TID to 5mg  TID.   ED COURSE On admission eval pt with dry heaving now relieved with IV phenergan. CBC wnl (no differential obtained), K+ 3.0, LFTs wnl, Tbili 1.6. Troponin I 0.03. Chest xray with no evidence of infx process. Pt's only complaint now is chronic back pain.   Review of Systems:  As stated in hpi, otherwise negative   Past Medical History:  Diagnosis Date  . Arthritis   . Atherosclerosis of abdominal aorta (HCC)    by xray  . Atrial fibrillation (HCC)   . CAD (coronary artery disease)    95% LAD stenosis, [place] x 02 September 1996  . Cervicalgia   . CHF (congestive heart failure) (HCC) 2014   on recent hospitalization  . Chronic interstitial lung disease (HCC) 08/2015   by CXR  . Depression with anxiety    celexa started 04/2010  . Dyslipidemia   . Facial trauma 06/01/2014   Fall 05/2014 without fracture by facial/head CT   . Gout    prior PCP stopped allopurinol  . Hyperlipidemia   .  Hypertension   . LBP (low back pain) 2005   spinal stenosis and bulging discs at L2-L5 s/p lumbar laminectomy [place])  . Lymphocytic colitis 10/28/2013  . Osteopenia 07/2013   T score -2.2 L femur  . Peripheral neuropathy since feb 2015   right arm   . Prediabetes 2001  . Syncopal episodes 2015   orthostatic, normal EEG and neuro eval ([place])  . T12 compression fracture (HCC) 2015   by CT    Social History:  reports that she has never smoked. She has never used smokeless tobacco. She reports that she drinks alcohol. She reports that she does not use drugs.  Allergies  Allergen Reactions  . Coumadin [Warfarin Sodium] Other (See Comments)    Makes pass out  . Adhesive [Tape] Rash  . Ivp Dye [Iodinated Diagnostic Agents] Other (See Comments)    Rash and turns purple Rash and turns purple  . Vytorin [Ezetimibe-Simvastatin] Other (See Comments)    Other leg cramps  . Iodides Rash    Family History  Problem Relation Age of Onset  . Prostate cancer Father   . Breast cancer Sister 49  . Heart disease Brother   . Heart disease Other        First degree relatives with heart disease but later onset  . Heart disease Sister        pacer  . Esophageal cancer Neg Hx   . Colon cancer Neg Hx  Prior to Admission medications   Medication Sig Start Date End Date Taking? Authorizing Provider  allopurinol (ZYLOPRIM) 100 MG tablet Take 100 mg by mouth daily.   Yes [provider]  aspirin EC 81 MG tablet Take 1 tablet (81 mg total) by mouth daily. 04/14/15  Yes Eustaquio Boyden, MD  atorvastatin (LIPITOR) 40 MG tablet Take 40 mg by mouth daily.   Yes [provider]  clotrimazole-betamethasone (LOTRISONE) cream Apply 1 application topically 2 (two) times daily. Apply externally BID for 2 wks 08/16/16  Yes Copland, Alicia B, PA-C  digoxin (LANOXIN) 0.125 MG tablet TAKE ONE TABLET BY MOUTH EVERY DAY 10/09/16  Yes Eustaquio Boyden, MD  FLUoxetine (PROZAC) 20 MG capsule  Take 20 mg by mouth daily.   Yes [provider]  furosemide (LASIX) 20 MG tablet Take 0.5 tablets (10 mg total) by mouth daily. Patient taking differently: Take 10 mg by mouth as needed.  11/14/16  Yes Rollene Rotunda, MD  lisinopril (PRINIVIL,ZESTRIL) 5 MG tablet Take 5 mg by mouth daily.   Yes [provider]  metoprolol tartrate (LOPRESSOR) 25 MG tablet TAKE ONE TABLET BY MOUTH TWICE DAILY 06/19/16  Yes Eustaquio Boyden, MD  Multiple Vitamins-Minerals (ONE-A-DAY WOMENS 50+ ADVANTAGE) TABS Take 1 tablet by mouth daily.    Yes [provider]  naproxen (NAPROSYN) 500 MG tablet Take 500 mg by mouth 2 (two) times daily with a meal.   Yes [provider]  nystatin cream (MYCOSTATIN) APPLY TOPICALLY TWICE A DAY 08/17/16  Yes Eustaquio Boyden, MD  oxyCODONE (OXY IR/ROXICODONE) 5 MG immediate release tablet Take 5 mg by mouth as needed for severe pain.   Yes [provider]  nitroGLYCERIN (NITROSTAT) 0.4 MG SL tablet Place 0.4 mg under the tongue every 5 (five) minutes as needed for chest pain.    [provider]   Physical Exam: Vitals:   11/15/16 1510 11/15/16 1600 11/15/16 1610 11/15/16 1700  BP: (!) 143/109 (!) 187/95 (!) 187/95 (!) 182/122  Pulse: 84 (!) 52 (!) 111 (!) 123  Resp: 18 (!) 23 18 (!) 24  Temp:      TempSrc:      SpO2: 100%  98%     Wt Readings from Last 3 Encounters:  10/17/16 54.7 kg (120 lb 8 oz)  10/13/16 52.6 kg (116 lb)  10/10/16 54.4 kg (120 lb)    General: sitting upright in bed, dry heaving into emesis basin Eyes: EOMI, normal lids, iris ENT: mucous membranes moist, no oropharyngeal lesions Neck: no LAD, masses or thyromegaly Cardiovascular: irregularly irregular, no LE edema Respiratory: CTA bilaterally, no w/r/r. Normal respiratory effort. Abdomen: soft, nontender, nondistended, BS+ Skin: no rash or induration seen on limited exam Musculoskeletal:grossly normal tone BUE/BLE Psychiatric: grossly normal  mood and affect, speech fluent and appropriate Neurologic: CN 2-12 grossly intact          Labs on Admission:  Basic Metabolic Panel:  Recent Labs Lab 11/15/16 1151  NA 139  K 3.0*  CL 104  CO2 20*  GLUCOSE 109*  BUN 8  CREATININE 1.08*  CALCIUM 8.9   Liver Function Tests:  Recent Labs Lab 11/15/16 1151  AST 27  ALT 16  ALKPHOS 116  BILITOT 1.6*  PROT 6.3*  ALBUMIN 3.0*    Recent Labs Lab 11/15/16 1151  LIPASE 23   No results for input(s): AMMONIA in the last 168 hours. CBC:  Recent Labs Lab 11/15/16 1151  WBC 10.2  HGB 12.2  HCT 36.0  MCV 93.0  PLT 518*   Cardiac Enzymes:  Recent Labs Lab 11/15/16 1301  TROPONINI 0.03*    BNP (last 3 results) No results for input(s): BNP in the last 8760 hours.  ProBNP (last 3 results) No results for input(s): PROBNP in the last 8760 hours.   Creatinine clearance cannot be calculated (Unknown ideal weight.)  CBG: No results for input(s): GLUCAP in the last 168 hours.  Radiological Exams on Admission: Dg Chest 2 View  Result Date: 11/15/2016 CLINICAL DATA:  Severe back pain since a recent fall on approximately 10/22/2016. EXAM: CHEST  2 VIEW COMPARISON:  PA and lateral chest 08/03/2015. Lumbar spine plain films 10/24/2016. FINDINGS: Peripheral fibrotic change bilaterally is stable in appearance. No consolidative process, pneumothorax or effusion. Heart size is normal. Aortic atherosclerosis is seen. Remote T12 compression fracture is identified. The patient also has an L1 compression fracture which is seen on the prior lumbar spine plain films. The degree of compression has worsened since the prior plain films and the patient now has a vertebral plana deformity anteriorly. IMPRESSION: Worsened appearance of an L1 compression fracture since the recent lumbar spine plain films with vertebra plana deformity now present. Chronic, mild T12 superior endplate compression fracture. No acute cardiopulmonary disease with  changes of pulmonary fibrosis noted. Aortic atherosclerosis. Electronically Signed   By: Drusilla Kanner M.D.   On: 11/15/2016 15:07   Dg Lumbar Spine 2-3 Views  Result Date: 11/15/2016 CLINICAL DATA:  Recent fall with low back pain, initial encounter EXAM: LUMBAR SPINE - 2-3 VIEW COMPARISON:  10/24/2016 FINDINGS: Stable T12 compression deformity is noted. The L1 compression deformity seen on the recent exam has increased by approximately 50% height loss. No other deformity is noted. Mild osteophytic changes are seen. Diffuse aortic calcifications are noted without aneurysmal dilatation. IMPRESSION: Increase in L1 compression deformity. Electronically Signed   By: Alcide Clever M.D.   On: 11/15/2016 15:51   Dg Hip Unilat With Pelvis 2-3 Views Right  Result Date: 11/15/2016 CLINICAL DATA:  Recent fall with back pain and hip pain, initial encounter EXAM: DG HIP (WITH OR WITHOUT PELVIS) 2-3V RIGHT COMPARISON:  None. FINDINGS: The pelvic ring is intact. Degenerative changes in the lumbar spine and hip joints are seen. No acute fracture or dislocation is noted. IMPRESSION: No acute abnormality noted. Electronically Signed   By: Alcide Clever M.D.   On: 11/15/2016 15:52    EKG: will order  Assessment/Plan Active Problems:   Essential hypertension, benign   Atrial fibrillation (HCC)   Low back pain   Depression with anxiety   Chronic diastolic CHF (congestive heart failure) (HCC)   Recurrent falls   Chronic pain   Lesion of right lung   Intractable vomiting   Intractable N/V -pt reports symptoms persistent x 5 days. D/c'd from [facility] on 8/12 per PCP note.  -abdominal exam benign, but bilious emesis on initial exam -supportive tx -will check KUB. Consider abdominal CT pending results. Check u/a -pt nontoxic appearing, afebrile, VSS, WBC wnl -? If this is medication side effect from recent antibiotic for UTI +/- NSAID.  Hypokalemia -secondary to above -replete IV. Recheck in am  AFib -rate  controlled. Per PCP note, not an anticoagulant candidate given recurrent falls. Resume asa with pos -check dig level with acute issues  Hx hypertension -stable.  -Will hold ACEI for now while hydrating. Can likely restart in am  Known closed compression fx L1 -acute and acutely worse in xray done today -continue pain control -  outpt follow-up with pain management and IR   Right lung lesion -incidental finding on chest xray at [facility].  -follow up per PCP  Code Status: full code  DVT Prophylaxis: SQ Lovenox Family Communication: none available at bedside on exam Disposition Plan: Pending Improvement    Army Chaco, NP [phone] [facility] [email or web address] Password [email or web address]

## 2016-11-15 NOTE — Progress Notes (Signed)
BP 138/80 (BP Location: Right Arm, Patient Position: Sitting)   Pulse (!) 114   Temp (!) 97.5 F (36.4 C) (Oral)   SpO2 99%    CC: hosp f/u visit, vomiting Subjective:    Patient ID: [name], female    DOB: Jan 17, 1933, 83 y.o.   MRN: [id number]  HPI: MARYCLAIRE DRAVES is a 83 y.o. female presenting on 11/15/2016 for Vomitting and Pain Control (Discuss pain options and what to do for her back and hip fractures.  Patient and family distraught want answers. )   Here with son today - they are upset about prolonged illness and lack of improvement.   Recent hospitalization at [facility] with ongoing lower back pain complicated by acute urinary retention and UTI. Treated with rocephin x1, UCx returned negative. She had acute kidney injury with Cr up to 2.0, on discharge Cr 1.0. Records reviewed. Labs showed Hgb 10.7, plt 507, wbc 7.5, discharge Cr 1.0, calcium 8.1 (L). Lumbar xray showed progression of L1 lumbar stenosis to severe (mild on imaging from our system). Persistent moderate compression deformity T12. Known chronic lumbar DDD with disc space narrowing L4/5 L5/S1.   CXR showed peripheral R hemithorax lung base opacity - rec CT chest for further evaluation.   Discharged with home health PT/OT - first appt tomorrow.   Constant nausea and dry heaves for last 5 days. Son endorses nausea/vomiting ongoing for 5 weeks. Pt denies urinary changes, endorses normal small stools and passing gas well. Some RLQ discomfort.   Newest medication was naprosyn which she did not tolerate well. She was also taking OTC NSAID for pain.  She has been on several abx and prednisone course in the past month for R forearm cellulitis. Ongoing syncope for last 5 years s/p unrevealing cardiology and neurology evaluation.  I spoke with Dr Vear Clock pain management last week who suggested IR referral to discuss vertebral augmentation in setting of recent L1 compression fracture and older T12 fracture after  recurrent falls. Recent wean from oxycodone 10mg  TID to oxycodone 5mg  TID. Some concern over medication diversion at home.   Admit Date: 11/10/2016 Discharge Date: 11/12/2016   Discharge Service: General Medicine (MED) Discharge to: Home with Home Health and/or PT/OT  Discharge Diagnoses: Principal Problem: Low back pain Active Problems: Paroxysmal atrial fibrillation (CMS-HCC) Hyperlipidemia Chest pain Depression Chronic diastolic heart failure (CMS-HCC) CAD (coronary artery disease) Essential hypertension Anxiety  Outpatient Provider Follow Up Issues: Pain management Spine surgery  Relevant past medical, surgical, family and social history reviewed and updated as indicated. Interim medical history since our last visit reviewed. Allergies and medications reviewed and updated. Outpatient Medications Prior to Visit  Medication Sig Dispense Refill  . aspirin EC 81 MG tablet Take 1 tablet (81 mg total) by mouth daily.    Marland Kitchen atorvastatin (LIPITOR) 40 MG tablet Take 40 mg by mouth daily.    . clotrimazole-betamethasone (LOTRISONE) cream Apply 1 application topically 2 (two) times daily. Apply externally BID for 2 wks 15 g 0  . digoxin (LANOXIN) 0.125 MG tablet TAKE ONE TABLET BY MOUTH EVERY DAY 30 tablet 5  . FLUoxetine (PROZAC) 20 MG capsule Take 20 mg by mouth daily.    . furosemide (LASIX) 20 MG tablet Take 0.5 tablets (10 mg total) by mouth daily. 30 tablet 2  . metoprolol tartrate (LOPRESSOR) 25 MG tablet TAKE ONE TABLET BY MOUTH TWICE DAILY 60 tablet 3  . Multiple Vitamins-Minerals (ONE-A-DAY WOMENS 50+ ADVANTAGE) TABS Take 1 tablet by mouth  daily.     . nitroGLYCERIN (NITROSTAT) 0.4 MG SL tablet Place 0.4 mg under the tongue every 5 (five) minutes as needed for chest pain.    Marland Kitchen nystatin cream (MYCOSTATIN) APPLY TOPICALLY TWICE A DAY 30 g 1  . cephALEXin (KEFLEX) 500 MG capsule Take 1 capsule (500 mg total) by mouth 3 (three) times daily. 21 capsule 0  . naproxen (NAPROSYN) 500  MG tablet Take 1 tablet (500 mg total) by mouth 2 (two) times daily with a meal. 20 tablet 0  . predniSONE (DELTASONE) 20 MG tablet Take 1 tablet (20 mg total) by mouth daily with breakfast. 10 tablet 0  . doxycycline (VIBRA-TABS) 100 MG tablet Take 1 tablet (100 mg total) by mouth 2 (two) times daily. 20 tablet 0   No facility-administered medications prior to visit.      Per HPI unless specifically indicated in ROS section below Review of Systems     Objective:    BP 138/80 (BP Location: Right Arm, Patient Position: Sitting)   Pulse (!) 114   Temp (!) 97.5 F (36.4 C) (Oral)   SpO2 99%   Wt Readings from Last 3 Encounters:  10/17/16 120 lb 8 oz (54.7 kg)  10/13/16 116 lb (52.6 kg)  10/10/16 120 lb (54.4 kg)    Physical Exam  Constitutional: She appears well-developed and well-nourished. She appears distressed.  In wheelchair, ongoing dry heaves throughout visit  HENT:  Mouth/Throat: Oropharynx is clear and moist. No oropharyngeal exudate.  Eyes: Pupils are equal, round, and reactive to light. Conjunctivae are normal.  Cardiovascular: Normal heart sounds and intact distal pulses.  An irregular rhythm present. Tachycardia present.   Pulmonary/Chest: Effort normal and breath sounds normal. No respiratory distress. She has no wheezes. She has no rales.  Abdominal: Soft. Normal appearance and bowel sounds are normal. She exhibits no distension and no mass. There is no hepatosplenomegaly. There is no tenderness. There is no rigidity, no rebound, no guarding and no CVA tenderness.  Musculoskeletal: She exhibits no edema.  Skin: Skin is warm and dry. Rash (healing erosions throughout skin) noted.  Psychiatric: She has a normal mood and affect.  Nursing note and vitals reviewed.  Results for orders placed or performed during the hospital encounter of 10/24/16  Lipase, blood  Result Value Ref Range   Lipase 24 11 - 51 U/L  Comprehensive metabolic panel  Result Value Ref Range    Sodium 140 135 - 145 mmol/L   Potassium 3.5 3.5 - 5.1 mmol/L   Chloride 105 101 - 111 mmol/L   CO2 24 22 - 32 mmol/L   Glucose, Bld 126 (H) 65 - 99 mg/dL   BUN 25 (H) 6 - 20 mg/dL   Creatinine, Ser 4.78 0.44 - 1.00 mg/dL   Calcium 9.6 8.9 - 29.5 mg/dL   Total Protein 6.3 (L) 6.5 - 8.1 g/dL   Albumin 3.8 3.5 - 5.0 g/dL   AST 28 15 - 41 U/L   ALT 17 14 - 54 U/L   Alkaline Phosphatase 88 38 - 126 U/L   Total Bilirubin 1.0 0.3 - 1.2 mg/dL   GFR calc non Af Amer 57 (L) >60 mL/min   GFR calc Af Amer >60 >60 mL/min   Anion gap 11 5 - 15  CBC  Result Value Ref Range   WBC 14.0 (H) 4.0 - 10.5 K/uL   RBC 3.88 3.87 - 5.11 MIL/uL   Hemoglobin 12.2 12.0 - 15.0 g/dL   HCT 62.1 30.8 -  46.0 %   MCV 95.4 78.0 - 100.0 fL   MCH 31.4 26.0 - 34.0 pg   MCHC 33.0 30.0 - 36.0 g/dL   RDW 16.1 09.6 - 04.5 %   Platelets 210 150 - 400 K/uL      Assessment & Plan:   Problem List Items Addressed This Visit    Atrial fibrillation (HCC)    AC relatively contraindicated due to recurrent falls. She is only on aspirin 81mg  daily.       Closed compression fracture of L1 lumbar vertebra (HCC)    New - acutely worse in interim from xray last month to this month - mild to severe compression fracture. I and pain management Dr Vear Clock had discussed referral for possible vertebral augmentation but will need to first resolve current nausea/vomiting and stabilize patient.  She recently did have oxycodone decreased from 10mg  TID to 5mg  TID.       Intractable nausea and vomiting - Primary    Of 5 days duration, not improved after zofran 4mg  in office. Anticipate recent naprosyn and aleve (she was taking this in addition) and aspirin exacerbating gastritis/GERD leading to current symptoms. afib flare as well.  Recent hospitalization at [facility] with ARF. Anticipate will need stabilization at ER with IV fluids and further evaluation so recommended ER eval today. I called and spoke with charge nurse. rec f/u in office  after hospital eval.  Pt and son agree.      Lesion of right lung    Newly found on CXR at [facility]. Will need f/u imaging once stabilized. Discussed with pt and son.       Low back pain   Recurrent falls       Follow up plan: Return if symptoms worsen or fail to improve.  Eustaquio Boyden, MD

## 2016-11-15 NOTE — ED Notes (Signed)
Pt resting quietly at this time

## 2016-11-15 NOTE — ED Notes (Signed)
No relief from Zofran.  Pt continues to have nausea and vomiting

## 2016-11-16 ENCOUNTER — Inpatient Hospital Stay (HOSPITAL_COMMUNITY): Payer: Medicare Other

## 2016-11-16 ENCOUNTER — Ambulatory Visit: Payer: Medicare Other | Admitting: Family Medicine

## 2016-11-16 DIAGNOSIS — I110 Hypertensive heart disease with heart failure: Secondary | ICD-10-CM | POA: Diagnosis present

## 2016-11-16 DIAGNOSIS — I5032 Chronic diastolic (congestive) heart failure: Secondary | ICD-10-CM | POA: Diagnosis not present

## 2016-11-16 DIAGNOSIS — G629 Polyneuropathy, unspecified: Secondary | ICD-10-CM | POA: Diagnosis present

## 2016-11-16 DIAGNOSIS — S32010A Wedge compression fracture of first lumbar vertebra, initial encounter for closed fracture: Secondary | ICD-10-CM | POA: Diagnosis present

## 2016-11-16 DIAGNOSIS — J849 Interstitial pulmonary disease, unspecified: Secondary | ICD-10-CM | POA: Diagnosis present

## 2016-11-16 DIAGNOSIS — I10 Essential (primary) hypertension: Secondary | ICD-10-CM

## 2016-11-16 DIAGNOSIS — G894 Chronic pain syndrome: Secondary | ICD-10-CM | POA: Diagnosis not present

## 2016-11-16 DIAGNOSIS — M4856XA Collapsed vertebra, not elsewhere classified, lumbar region, initial encounter for fracture: Secondary | ICD-10-CM | POA: Diagnosis not present

## 2016-11-16 DIAGNOSIS — Z9109 Other allergy status, other than to drugs and biological substances: Secondary | ICD-10-CM | POA: Diagnosis not present

## 2016-11-16 DIAGNOSIS — F418 Other specified anxiety disorders: Secondary | ICD-10-CM

## 2016-11-16 DIAGNOSIS — M109 Gout, unspecified: Secondary | ICD-10-CM | POA: Diagnosis present

## 2016-11-16 DIAGNOSIS — I2510 Atherosclerotic heart disease of native coronary artery without angina pectoris: Secondary | ICD-10-CM | POA: Diagnosis present

## 2016-11-16 DIAGNOSIS — Z955 Presence of coronary angioplasty implant and graft: Secondary | ICD-10-CM | POA: Diagnosis not present

## 2016-11-16 DIAGNOSIS — Z8249 Family history of ischemic heart disease and other diseases of the circulatory system: Secondary | ICD-10-CM | POA: Diagnosis not present

## 2016-11-16 DIAGNOSIS — R7303 Prediabetes: Secondary | ICD-10-CM | POA: Diagnosis present

## 2016-11-16 DIAGNOSIS — F419 Anxiety disorder, unspecified: Secondary | ICD-10-CM | POA: Diagnosis present

## 2016-11-16 DIAGNOSIS — M549 Dorsalgia, unspecified: Secondary | ICD-10-CM | POA: Diagnosis present

## 2016-11-16 DIAGNOSIS — F329 Major depressive disorder, single episode, unspecified: Secondary | ICD-10-CM | POA: Diagnosis present

## 2016-11-16 DIAGNOSIS — E785 Hyperlipidemia, unspecified: Secondary | ICD-10-CM | POA: Diagnosis present

## 2016-11-16 DIAGNOSIS — R112 Nausea with vomiting, unspecified: Secondary | ICD-10-CM | POA: Diagnosis not present

## 2016-11-16 DIAGNOSIS — R296 Repeated falls: Secondary | ICD-10-CM | POA: Diagnosis present

## 2016-11-16 DIAGNOSIS — M545 Low back pain: Secondary | ICD-10-CM

## 2016-11-16 DIAGNOSIS — Z91041 Radiographic dye allergy status: Secondary | ICD-10-CM | POA: Diagnosis not present

## 2016-11-16 DIAGNOSIS — Z87440 Personal history of urinary (tract) infections: Secondary | ICD-10-CM | POA: Diagnosis not present

## 2016-11-16 DIAGNOSIS — I482 Chronic atrial fibrillation: Secondary | ICD-10-CM | POA: Diagnosis present

## 2016-11-16 DIAGNOSIS — M1990 Unspecified osteoarthritis, unspecified site: Secondary | ICD-10-CM | POA: Diagnosis present

## 2016-11-16 DIAGNOSIS — G8929 Other chronic pain: Secondary | ICD-10-CM | POA: Diagnosis present

## 2016-11-16 DIAGNOSIS — I481 Persistent atrial fibrillation: Secondary | ICD-10-CM

## 2016-11-16 DIAGNOSIS — Z888 Allergy status to other drugs, medicaments and biological substances status: Secondary | ICD-10-CM | POA: Diagnosis not present

## 2016-11-16 DIAGNOSIS — E876 Hypokalemia: Secondary | ICD-10-CM | POA: Diagnosis present

## 2016-11-16 LAB — CBC
HCT: 30.9 % — ABNORMAL LOW (ref 36.0–46.0)
Hemoglobin: 10.2 g/dL — ABNORMAL LOW (ref 12.0–15.0)
MCH: 31.9 pg (ref 26.0–34.0)
MCHC: 33 g/dL (ref 30.0–36.0)
MCV: 96.6 fL (ref 78.0–100.0)
PLATELETS: 390 10*3/uL (ref 150–400)
RBC: 3.2 MIL/uL — AB (ref 3.87–5.11)
RDW: 16.7 % — ABNORMAL HIGH (ref 11.5–15.5)
WBC: 6.8 10*3/uL (ref 4.0–10.5)

## 2016-11-16 LAB — TROPONIN I
TROPONIN I: 0.03 ng/mL — AB (ref <0.03)
Troponin I: 0.03 ng/mL (ref <0.03)

## 2016-11-16 LAB — BASIC METABOLIC PANEL
ANION GAP: 6 (ref 5–15)
BUN: 5 mg/dL — ABNORMAL LOW (ref 6–20)
CALCIUM: 7.7 mg/dL — AB (ref 8.9–10.3)
CO2: 22 mmol/L (ref 22–32)
Chloride: 113 mmol/L — ABNORMAL HIGH (ref 101–111)
Creatinine, Ser: 0.81 mg/dL (ref 0.44–1.00)
Glucose, Bld: 73 mg/dL (ref 65–99)
POTASSIUM: 3.6 mmol/L (ref 3.5–5.1)
Sodium: 141 mmol/L (ref 135–145)

## 2016-11-16 MED ORDER — ENSURE ENLIVE PO LIQD
237.0000 mL | Freq: Every day | ORAL | Status: DC
  Administered 2016-11-17: 237 mL via ORAL

## 2016-11-16 MED ORDER — LISINOPRIL 5 MG PO TABS
5.0000 mg | ORAL_TABLET | Freq: Every day | ORAL | Status: DC
  Administered 2016-11-17 – 2016-11-18 (×2): 5 mg via ORAL
  Filled 2016-11-16 (×2): qty 1

## 2016-11-16 MED ORDER — OXYCODONE HCL 5 MG PO TABS
5.0000 mg | ORAL_TABLET | ORAL | Status: DC | PRN
  Administered 2016-11-16 – 2016-11-18 (×3): 5 mg via ORAL
  Filled 2016-11-16 (×3): qty 1

## 2016-11-16 NOTE — Progress Notes (Signed)
Per CCMD pt had 2.62 pulse DR Laural Benesjohnson the attending provider has been notified

## 2016-11-16 NOTE — Progress Notes (Signed)
IR aware of consult request.  Will await MRI of the lumbar spine to determine if the patient is a candidate.  She would also need insurance authorization also before we could proceed.  Santino Kinsella E 11/16/2016

## 2016-11-16 NOTE — Progress Notes (Signed)
Bladder scan done retaining 509cc Dr Laural Benesjohnson notified no order yet [facility] report

## 2016-11-16 NOTE — Progress Notes (Signed)
CRITICAL VALUE ALERT  Critical Value:  Troponin 0.03  Date & Time Notied:  0040  Provider Notified: schoor  Orders Received/Actions taken: non

## 2016-11-16 NOTE — Progress Notes (Deleted)
Pt admitted from ED accompanied by RN, on arrival pt was alert and oriented self introduced to pt oriented to room and pt care equipment, ID bracelet verified with her, vital signs stable, fall assessment done, call light and phone within reach pt able to demonstrate how to use them, prescribed treatment started will continue to monitor pt

## 2016-11-16 NOTE — Progress Notes (Signed)
PROGRESS NOTE    [name]  BOF:[id number]  DOB: 1932-12-27  DOA: 11/15/2016 PCP: Eustaquio Boyden, MD  Brief Admission Hx: [name] is a 83 y.o. female with hx afib, CAD, dyslipidemia, depression, anxiety, chronic back pain secondary to known L1 compression fx being managed by pain clinic and PCP. Pt presented to PCP today with intractable vomiting x 4-5 days. PCP note reports recent hospitalization at [facility] 8/10 through 11/12/16 d/t ongoing back pain. Hospital course complicated by UTI and pt tx'd with Rocephin. Urine cx negative. Pt is poor historian and uncertain if she was d/c'd on antibiotic. Son is not present on admission evaluation. She denies any pain with urination or urinary frequency. She has been on several recent antibiotics for right forearm cellulitis. Pt was recently tried on Naprosyn for chronic pain that was not tolerated well. Per PCP note, Oxycodone recently weaned from 10mg  TID to 5mg  TID.   MDM/Assessment & Plan:     N/V -pt reports symptoms persistent x 5 days. D/c'd from [facility] on 8/12 per PCP note.  -abdominal exam benign, but bilious emesis on initial exam -symptoms resolving with supportive therapy - advance diet today.  Hypokalemia -secondary to above -repleted IV.  AFib -rate controlled. Per PCP note, not an anticoagulant candidate given recurrent falls. Resume asa with pos -dig level normal   Hx hypertension -stable.   Known closed compression fx L1 -acute and acutely worse in xray done today -continue pain control -outpt follow-up with pain management  - Pt requested IR eval for kyphoplasty, they are ordering MRI and will assess after that  Right lung lesion -incidental finding on chest xray at [facility].  -follow up per PCP  Code Status: full code  DVT Prophylaxis: SQ Lovenox Family Communication: none available at bedside on exam Disposition Plan: Pending Improvement  Consultants:  I.R.   Subjective: Pt still  having a lot of back pain and hip pain but not vomiting, wanting to eat something.   Objective: Vitals:   11/16/16 0513 11/16/16 1146 11/16/16 1523 11/16/16 1541  BP: 130/68 131/75  128/79  Pulse: 66 75  90  Resp: 17 18 18 18   Temp: 98 F (36.7 C)  97.7 F (36.5 C) 97.7 F (36.5 C)  TempSrc: Oral  Oral Oral  SpO2: 96% 98%  99%  Weight:      Height:        Intake/Output Summary (Last 24 hours) at 11/16/16 1629 Last data filed at 11/16/16 1521  Gross per 24 hour  Intake          3518.33 ml  Output              700 ml  Net          2818.33 ml   Filed Weights   11/15/16 1906  Weight: 54.3 kg (119 lb 11.2 oz)     REVIEW OF SYSTEMS  As per history otherwise all reviewed and reported negative  Exam:  General exam: awake, alert, NAD Respiratory system: No increased work of breathing. Cardiovascular system: S1 & S2 heard, RRR. No JVD, murmurs, gallops, clicks or pedal edema. Gastrointestinal system: Abdomen is nondistended, soft and nontender. Normal bowel sounds heard. Central nervous system: Alert and oriented. No focal neurological deficits. Extremities: no CCE.  Data Reviewed: Basic Metabolic Panel:  Recent Labs Lab 11/15/16 1151 11/16/16 0538  NA 139 141  K 3.0* 3.6  CL 104 113*  CO2 20* 22  GLUCOSE 109* 73  BUN 8 5*  CREATININE 1.08* 0.81  CALCIUM 8.9 7.7*   Liver Function Tests:  Recent Labs Lab 11/15/16 1151  AST 27  ALT 16  ALKPHOS 116  BILITOT 1.6*  PROT 6.3*  ALBUMIN 3.0*    Recent Labs Lab 11/15/16 1151  LIPASE 23   No results for input(s): AMMONIA in the last 168 hours. CBC:  Recent Labs Lab 11/15/16 1151 11/16/16 0538  WBC 10.2 6.8  HGB 12.2 10.2*  HCT 36.0 30.9*  MCV 93.0 96.6  PLT 518* 390   Cardiac Enzymes:  Recent Labs Lab 11/15/16 1301 11/15/16 1706 11/15/16 2342 11/16/16 0538  TROPONINI 0.03* <0.03 0.03* 0.03*   CBG (last 3)  No results for input(s): GLUCAP in the last 72 hours. No results found for this  or any previous visit (from the past 240 hour(s)).   Studies: Dg Chest 2 View  Result Date: 11/15/2016 CLINICAL DATA:  Severe back pain since a recent fall on approximately 10/22/2016. EXAM: CHEST  2 VIEW COMPARISON:  PA and lateral chest 08/03/2015. Lumbar spine plain films 10/24/2016. FINDINGS: Peripheral fibrotic change bilaterally is stable in appearance. No consolidative process, pneumothorax or effusion. Heart size is normal. Aortic atherosclerosis is seen. Remote T12 compression fracture is identified. The patient also has an L1 compression fracture which is seen on the prior lumbar spine plain films. The degree of compression has worsened since the prior plain films and the patient now has a vertebral plana deformity anteriorly. IMPRESSION: Worsened appearance of an L1 compression fracture since the recent lumbar spine plain films with vertebra plana deformity now present. Chronic, mild T12 superior endplate compression fracture. No acute cardiopulmonary disease with changes of pulmonary fibrosis noted. Aortic atherosclerosis. Electronically Signed   By: Drusilla Kanner M.D.   On: 11/15/2016 15:07   Dg Lumbar Spine 2-3 Views  Result Date: 11/15/2016 CLINICAL DATA:  Recent fall with low back pain, initial encounter EXAM: LUMBAR SPINE - 2-3 VIEW COMPARISON:  10/24/2016 FINDINGS: Stable T12 compression deformity is noted. The L1 compression deformity seen on the recent exam has increased by approximately 50% height loss. No other deformity is noted. Mild osteophytic changes are seen. Diffuse aortic calcifications are noted without aneurysmal dilatation. IMPRESSION: Increase in L1 compression deformity. Electronically Signed   By: Alcide Clever M.D.   On: 11/15/2016 15:51   Dg Abd 1 View  Result Date: 11/15/2016 CLINICAL DATA:  Abdominal pain EXAM: ABDOMEN - 1 VIEW COMPARISON:  None. FINDINGS: The bowel gas pattern is normal. No radio-opaque calculi or other significant radiographic abnormality are  seen. IMPRESSION: Negative. Electronically Signed   By: Deatra Robinson M.D.   On: 11/15/2016 20:31   Dg Hip Unilat With Pelvis 2-3 Views Right  Result Date: 11/15/2016 CLINICAL DATA:  Recent fall with back pain and hip pain, initial encounter EXAM: DG HIP (WITH OR WITHOUT PELVIS) 2-3V RIGHT COMPARISON:  None. FINDINGS: The pelvic ring is intact. Degenerative changes in the lumbar spine and hip joints are seen. No acute fracture or dislocation is noted. IMPRESSION: No acute abnormality noted. Electronically Signed   By: Alcide Clever M.D.   On: 11/15/2016 15:52     Scheduled Meds: . allopurinol  100 mg Oral Daily  . clotrimazole   Topical BID  . digoxin  125 mcg Oral Daily  . enoxaparin (LOVENOX) injection  40 mg Subcutaneous Q24H  . feeding supplement (ENSURE ENLIVE)  237 mL Oral QHS  . FLUoxetine  20 mg Oral Daily  . metoprolol tartrate  25 mg Oral BID   Continuous Infusions: . sodium chloride 125 mL/hr at 11/16/16 1308    Active Problems:   Essential hypertension, benign   Atrial fibrillation (HCC)   Low back pain   Depression with anxiety   Chronic diastolic CHF (congestive heart failure) (HCC)   Recurrent falls   Chronic pain   Lesion of right lung   Intractable vomiting   Compression fracture of first lumbar vertebra (HCC)   Time spent:   Standley Dakins, MD, FAAFP [facility] Pager [phone]  If 7PM-7AM, please contact night-coverage [email or web address] Password [email or web address] 11/16/2016, 4:29 PM    LOS: 0 days

## 2016-11-16 NOTE — Progress Notes (Signed)
Initial Nutrition Assessment  DOCUMENTATION CODES:   Not applicable  INTERVENTION:   -Ensure Enlive po daily, each supplement provides 350 kcal and 20 grams of protein  NUTRITION DIAGNOSIS:   Predicted suboptimal nutrient intake related to acute illness as evidenced by per patient/family report, percent weight loss.  GOAL:   Patient will meet greater than or equal to 90% of their needs  MONITOR:   PO intake, Supplement acceptance, Labs, Weight trends, Skin, I & O's  REASON FOR ASSESSMENT:   Malnutrition Screening Tool    ASSESSMENT:   [name] is a 83 y.o. female with hx afib, CAD, dyslipidemia, depression, anxiety, chronic back pain secondary to known L1 compression fx being managed by pain clinic and PCP. Pt presented to PCP today with intractable vomiting x 4-5 days.   Pt reports decline in intake over the past month, related to limited mobility and pain. Pt shares with this RD that it has been difficult for her to ambulate with her Monette secondary to hip pain.  Pt reports she generally has a fairly good. Pt's diet is high in fruits and vegetables; commonly eaten foods include milk, fresh fruit, cheese, and soups. Pt typically avoids fish and meat, as she does not like the taste. Pt currently feels hungry, however, understanding of NPO diet order during RD visit.   Pt endorses weight loss; reports UBW of around 115#, however, this is not consistent with wt hx. Pt's wt has been stable over the past 3 months, however, noted a 7.7% wt loss over the past year (not significant for time frame).   Nutrition-Focused physical exam completed. Findings are no fat depletion, no muscle depletion, and no edema.   Pt eager to eat when allowed. Discussed importance of good meal intake to promote healing. Discussed ways to increase protein intake in diet.  Labs reviewed.   Diet Order:  Diet Heart Room service appropriate? Yes; Fluid consistency: Thin Diet NPO time specified  Except for: Sips with Meds  Skin:  Reviewed, no issues  Last BM:  11/15/16  Height:   Ht Readings from Last 1 Encounters:  11/15/16 5\' 5"  (1.651 m)    Weight:   Wt Readings from Last 1 Encounters:  11/15/16 119 lb 11.2 oz (54.3 kg)    Ideal Body Weight:  56.8 kg  BMI:  Body mass index is 19.92 kg/m.  Estimated Nutritional Needs:   Kcal:  1200-1400  Protein:  60-75 grams  Fluid:  1.2-1.4 L  EDUCATION NEEDS:   Education needs addressed  Illa Enlow A. Mayford KnifeWilliams, RD, LDN, CDE Pager: [phone] After hours Pager: [phone]

## 2016-11-16 NOTE — Evaluation (Signed)
Physical Therapy Evaluation Patient Details Name: [name] D Lasater MRN: [id number] DOB: 12/22/1932 Today's Date: 11/16/2016   History of Present Illness  Pt is an 83 y/o female admitted from PCP secondary intractable nausea. Pt also with fall and Lumbar imaging showed increase in L1 compression deformity. Per note and RN, compression fx to be managed outpatient. PMH includes HTN, a fib, chronic back pain and L1 compression fx, dCHF, and lesion in R lung.   Clinical Impression  Pt admitted secondary to problem above with deficits below. PTA, pt was independent using Rollator, however, reports she "hobbled along" and was not safe. Pt with history of 2 falls, where she reports "blacking out." Encouraged to discuss with MD. Upon eval, pt unsteady during ambulation, with generalized LE weakness, and with decreased safety awareness. Required min to min guard assist for functional mobility this session. Pt wanting to go home at this time and refusing SNF, therefore will need services below to ensure safety at home. Reports son lives with her, however, does not like to help with ADLs and mobility. Will continue to follow acutely to maximize functional mobility independence and safety.     Follow Up Recommendations Home health PT;Supervision/Assistance - 24 hour (HHOT, HHaide)    Equipment Recommendations  3in1 (PT)    Recommendations for Other Services       Precautions / Restrictions Precautions Precautions: Fall Precaution Comments: Has had 2 falls at home. Reports she "blacks out." Encouraged to speak with MD about symptoms.  Restrictions Weight Bearing Restrictions: No      Mobility  Bed Mobility Overal bed mobility: Needs Assistance Bed Mobility: Rolling;Sidelying to Sit;Sit to Sidelying Rolling: Min assist Sidelying to sit: Min assist     Sit to sidelying: Min guard General bed mobility comments: Verbal cues for use of log roll technique to protect back and decrease pain. Pt also  required manual assist to ensure log roll technique. Min A for trunk elevation.   Transfers Overall transfer level: Needs assistance Equipment used: Rolling Balcom (2 wheeled) Transfers: Sit to/from Stand Sit to Stand: Min guard         General transfer comment: Min guard for steadying assist upon standing. Verbal cues for safe hand placement.   Ambulation/Gait Ambulation/Gait assistance: Min guard;Min assist Ambulation Distance (Feet): 25 Feet Assistive device: Rolling [place] (2 wheeled) Gait Pattern/deviations: Step-through pattern;Decreased stride length;Antalgic;Trunk flexed Gait velocity: Decreased Gait velocity interpretation: Below normal speed for age/gender General Gait Details: Slow, antalgic gait. Unsteadiness noted and decreased safety awareness with use of RW. Pt wanting to leave RW behind, and required cues to keep with her to increase safety. Verbal cues for upright posture and proximity to device. Decreased weightshift to RLE secondary to pain. Only agreeable to walk to bathroom and back because food tray brought into room during session.   Stairs            Wheelchair Mobility    Modified Rankin (Stroke Patients Only)       Balance Overall balance assessment: Needs assistance Sitting-balance support: No upper extremity supported;Feet supported Sitting balance-Leahy Scale: Good     Standing balance support: Bilateral upper extremity supported;During functional activity Standing balance-Leahy Scale: Poor Standing balance comment: Reliant on UE support on RW.                              Pertinent Vitals/Pain Pain Assessment: 0-10 Pain Score: 8  Pain Location: R hip  Pain Descriptors /  Indicators: Aching;Sore;Grimacing Pain Intervention(s): Limited activity within patient's tolerance;Monitored during session;Repositioned    Home Living Family/patient expects to be discharged to:: Private residence Living Arrangements:  Children Available Help at Discharge: Family;Available 24 hours/day Type of Home: House Home Access: Stairs to enter Entrance Stairs-Rails: None Entrance Stairs-Number of Steps: 2 Home Layout: One level Home Equipment: Cane - single point;Wheelchair - [employer] - 4 wheels      Prior Function Level of Independence: Independent with assistive device(s)         Comments: Uses RW at baseline. Reports she needs assist with stair management from son.      Hand Dominance   Dominant Hand: Right    Extremity/Trunk Assessment   Upper Extremity Assessment Upper Extremity Assessment: Defer to OT evaluation    Lower Extremity Assessment Lower Extremity Assessment: Generalized weakness;RLE deficits/detail RLE Deficits / Details: R hip pain and baseline. Reports she is supposed to have surgery on it.     Cervical / Trunk Assessment Cervical / Trunk Assessment: Kyphotic  Communication   Communication: No difficulties  Cognition Arousal/Alertness: Awake/alert Behavior During Therapy: WFL for tasks assessed/performed Overall Cognitive Status: No family/caregiver present to determine baseline cognitive functioning                                 General Comments: Pt with some decreased safety awareness, however, no family/caregiver present to determine baseline.       General Comments General comments (skin integrity, edema, etc.): Pt educated about deficits and current fall risk, and not wanting to go to SNF, therefore will need HHPT, HHOT, and HHaide to ensure safety. Pt reports son does not like to help her with ADLs, however, son not present during session.     Exercises     Assessment/Plan    PT Assessment Patient needs continued PT services  PT Problem List Decreased strength;Decreased balance;Decreased mobility;Decreased coordination;Decreased knowledge of use of DME;Decreased safety awareness;Decreased knowledge of precautions;Pain       PT Treatment  Interventions Gait training;DME instruction;Stair training;Functional mobility training;Therapeutic activities;Therapeutic exercise;Balance training;Neuromuscular re-education;Patient/family education    PT Goals (Current goals can be found in the Care Plan section)  Acute Rehab PT Goals Patient Stated Goal: to feel better  PT Goal Formulation: With patient Time For Goal Achievement: 11/23/16 Potential to Achieve Goals: Good    Frequency Min 3X/week   Barriers to discharge        Co-evaluation               AM-PAC PT "6 Clicks" Daily Activity  Outcome Measure Difficulty turning over in bed (including adjusting bedclothes, sheets and blankets)?: Total Difficulty moving from lying on back to sitting on the side of the bed? : Total Difficulty sitting down on and standing up from a chair with arms (e.g., wheelchair, bedside commode, etc,.)?: Total Help needed moving to and from a bed to chair (including a wheelchair)?: A Little Help needed walking in hospital room?: A Little Help needed climbing 3-5 steps with a railing? : A Lot 6 Click Score: 11    End of Session Equipment Utilized During Treatment: Gait belt Activity Tolerance: Patient tolerated treatment well Patient left: in bed;with call bell/phone within reach;with bed alarm set Nurse Communication: Mobility status PT Visit Diagnosis: Unsteadiness on feet (R26.81);Muscle weakness (generalized) (M62.81);Repeated falls (R29.6);Pain Pain - Right/Left: Right Pain - part of body: Hip    Time: 1331-1350 PT Time Calculation (min) (ACUTE  ONLY): 19 min   Charges:   PT Evaluation $PT Eval Moderate Complexity: 1 Mod     PT G Codes:   PT G-Codes **NOT FOR INPATIENT CLASS** Functional Assessment Tool Used: AM-PAC 6 Clicks Basic Mobility Functional Limitation: Mobility: Walking and moving around Mobility: Walking and Moving Around Current Status (Z6109): At least 60 percent but less than 80 percent impaired, limited or  restricted Mobility: Walking and Moving Around Goal Status [phone]): At least 1 percent but less than 20 percent impaired, limited or restricted    Gladys Damme, PT, DPT  Acute Rehabilitation Services  Pager: [phone]   Lehman Prom 11/16/2016, 2:18 PM

## 2016-11-17 ENCOUNTER — Encounter (HOSPITAL_COMMUNITY): Payer: Self-pay | Admitting: Radiology

## 2016-11-17 ENCOUNTER — Inpatient Hospital Stay (HOSPITAL_COMMUNITY): Payer: Medicare Other

## 2016-11-17 DIAGNOSIS — R112 Nausea with vomiting, unspecified: Secondary | ICD-10-CM

## 2016-11-17 HISTORY — PX: IR VERTEBROPLASTY LUMBAR BX INC UNI/BIL INC/INJECT/IMAGING: IMG5516

## 2016-11-17 LAB — URINALYSIS, ROUTINE W REFLEX MICROSCOPIC
Bacteria, UA: NONE SEEN
Bilirubin Urine: NEGATIVE
GLUCOSE, UA: NEGATIVE mg/dL
HGB URINE DIPSTICK: NEGATIVE
Ketones, ur: NEGATIVE mg/dL
Nitrite: NEGATIVE
Protein, ur: NEGATIVE mg/dL
SPECIFIC GRAVITY, URINE: 1.018 (ref 1.005–1.030)
pH: 5 (ref 5.0–8.0)

## 2016-11-17 MED ORDER — FLUMAZENIL 0.5 MG/5ML IV SOLN
INTRAVENOUS | Status: AC
  Filled 2016-11-17: qty 5

## 2016-11-17 MED ORDER — IOPAMIDOL (ISOVUE-300) INJECTION 61%
INTRAVENOUS | Status: AC
  Filled 2016-11-17: qty 50

## 2016-11-17 MED ORDER — FENTANYL CITRATE (PF) 100 MCG/2ML IJ SOLN
INTRAMUSCULAR | Status: AC | PRN
  Administered 2016-11-17 (×2): 25 ug via INTRAVENOUS

## 2016-11-17 MED ORDER — LIDOCAINE HCL (PF) 1 % IJ SOLN
INTRAMUSCULAR | Status: AC
  Filled 2016-11-17: qty 30

## 2016-11-17 MED ORDER — FLUMAZENIL 0.5 MG/5ML IV SOLN
INTRAVENOUS | Status: AC | PRN
  Administered 2016-11-17: .25 mg via INTRAVENOUS

## 2016-11-17 MED ORDER — MIDAZOLAM HCL 2 MG/2ML IJ SOLN
INTRAMUSCULAR | Status: AC | PRN
  Administered 2016-11-17 (×2): 1 mg via INTRAVENOUS

## 2016-11-17 MED ORDER — MIDAZOLAM HCL 2 MG/2ML IJ SOLN
INTRAMUSCULAR | Status: AC
  Filled 2016-11-17: qty 2

## 2016-11-17 MED ORDER — CEFAZOLIN SODIUM-DEXTROSE 2-4 GM/100ML-% IV SOLN
INTRAVENOUS | Status: AC
  Filled 2016-11-17: qty 100

## 2016-11-17 MED ORDER — LIDOCAINE HCL (PF) 1 % IJ SOLN
INTRAMUSCULAR | Status: DC | PRN
  Administered 2016-11-17: 15 mL

## 2016-11-17 MED ORDER — LIDOCAINE HCL (PF) 1 % IJ SOLN
INTRAMUSCULAR | Status: AC | PRN
  Administered 2016-11-17: 10 mL

## 2016-11-17 MED ORDER — FENTANYL CITRATE (PF) 100 MCG/2ML IJ SOLN
INTRAMUSCULAR | Status: AC
  Filled 2016-11-17: qty 2

## 2016-11-17 MED ORDER — ASPIRIN EC 81 MG PO TBEC
81.0000 mg | DELAYED_RELEASE_TABLET | Freq: Every day | ORAL | Status: DC
  Administered 2016-11-18: 81 mg via ORAL
  Filled 2016-11-17: qty 1

## 2016-11-17 MED ORDER — SODIUM CHLORIDE 0.9 % IV SOLN: INTRAVENOUS | Status: AC

## 2016-11-17 MED ORDER — ATORVASTATIN CALCIUM 40 MG PO TABS
40.0000 mg | ORAL_TABLET | Freq: Every day | ORAL | Status: DC
  Administered 2016-11-17: 40 mg via ORAL
  Filled 2016-11-17: qty 1

## 2016-11-17 MED ORDER — TOBRAMYCIN SULFATE 1.2 G IJ SOLR
INTRAMUSCULAR | Status: AC
  Administered 2016-11-17: 15:00:00
  Filled 2016-11-17: qty 1.2

## 2016-11-17 MED ORDER — GELATIN ABSORBABLE 12-7 MM EX MISC
CUTANEOUS | Status: AC
  Administered 2016-11-17: 15:00:00
  Filled 2016-11-17: qty 1

## 2016-11-17 MED ORDER — CEFAZOLIN SODIUM-DEXTROSE 2-4 GM/100ML-% IV SOLN
2.0000 g | INTRAVENOUS | Status: AC
  Administered 2016-11-17: 2 g via INTRAVENOUS

## 2016-11-17 NOTE — Sedation Documentation (Signed)
Patient is resting comfortably. 

## 2016-11-17 NOTE — Progress Notes (Signed)
PT Cancellation Note  Patient Details Name: [name] MRN: [id number] DOB: [birth year]   Cancelled Treatment:    Reason Eval/Treat Not Completed: Patient at procedure or test/unavailable.  Gone to IR for T1 stabilization )  Will see later as able. 11/17/2016  [place] Bing, PT [phone]  (pager)   [name] 11/17/2016, 1:29 PM

## 2016-11-17 NOTE — Progress Notes (Signed)
PROGRESS NOTE  [name]  ZOX:[id number]  DOB: [birth year]  DOA: 11/15/2016 PCP: Eustaquio Boyden, MD  Brief Admission Hx: [name] is a 83 y.o. female with hx afib, CAD, dyslipidemia, depression, anxiety, chronic back pain secondary to known L1 compression fx being managed by pain clinic and PCP. Pt presented to PCP today with intractable vomiting x 4-5 days. PCP note reports recent hospitalization at [facility] 8/10 through 11/12/16 d/t ongoing back pain. Hospital course complicated by UTI and pt tx'd with Rocephin. Urine cx negative. Pt is poor historian and uncertain if she was d/c'd on antibiotic. Son is not present on admission evaluation. She denies any pain with urination or urinary frequency. She has been on several recent antibiotics for right forearm cellulitis. Pt was recently tried on Naprosyn for chronic pain that was not tolerated well. Per PCP note, Oxycodone recently weaned from 10mg  TID to 5mg  TID.   MDM/Assessment & Plan:    N/V - resolved now -pt reports symptoms persistent x 5 days. D/c'd from [facility] on 8/12 per PCP note.  -abdominal exam benign, but bilious emesis on initial exam -symptoms resolved with supportive therapy - advance diet today.  Hypokalemia -secondary to above -repleted IV.  AFib -rate controlled. Per PCP note, not an anticoagulant candidate given recurrent falls. Resume asa when pos -dig level normal   Hx hypertension -stable.   Known closed compression fx L1 -acute and acutely worse in xray done today -continue pain control -outpt follow-up with pain management  - IR to do kyphoplasty procedure 8/17 - Plan discharge home 8/18  Right lung lesion -incidental finding on chest xray at [facility].  -follow up per PCP  Code Status: full code  DVT Prophylaxis: SQ Lovenox Family Communication: none available at bedside on exam Disposition Plan: discharge home 8/18 with Home health services, refused SNF  Consultants:  I.R.    Subjective: Pt says that she has not been vomiting or having nausea. Back pain persists.   Objective: Vitals:   11/17/16 1517 11/17/16 1534 11/17/16 1548 11/17/16 1608  BP: 130/60 100/78 136/76 (!) 136/52  Pulse: (!) 51 (!) 52 (!) 56 (!) 53  Resp: 14 12 12 12   Temp:      TempSrc:      SpO2: 93% 100% 96% 95%  Weight:      Height:        Intake/Output Summary (Last 24 hours) at 11/17/16 1640 Last data filed at 11/17/16 8119  Gross per 24 hour  Intake              625 ml  Output              275 ml  Net              350 ml   Filed Weights   11/15/16 1906  Weight: 54.3 kg (119 lb 11.2 oz)   REVIEW OF SYSTEMS  As per history otherwise all reviewed and reported negative  Exam:  General exam: awake, alert, NAD  Respiratory system: No increased work of breathing. Cardiovascular system: S1 & S2 heard, RRR. No JVD, murmurs, gallops, clicks or pedal edema. Gastrointestinal system: Abdomen is nondistended, soft and nontender. Normal bowel sounds heard. Central nervous system: Alert and oriented. No focal neurological deficits. Extremities: no CCE.  Data Reviewed: Basic Metabolic Panel:  Recent Labs Lab 11/15/16 1151 11/16/16 0538  NA 139 141  K 3.0* 3.6  CL 104 113*  CO2 20* 22  GLUCOSE 109*  73  BUN 8 5*  CREATININE 1.08* 0.81  CALCIUM 8.9 7.7*   Liver Function Tests:  Recent Labs Lab 11/15/16 1151  AST 27  ALT 16  ALKPHOS 116  BILITOT 1.6*  PROT 6.3*  ALBUMIN 3.0*    Recent Labs Lab 11/15/16 1151  LIPASE 23   No results for input(s): AMMONIA in the last 168 hours. CBC:  Recent Labs Lab 11/15/16 1151 11/16/16 0538  WBC 10.2 6.8  HGB 12.2 10.2*  HCT 36.0 30.9*  MCV 93.0 96.6  PLT 518* 390   Cardiac Enzymes:  Recent Labs Lab 11/15/16 1301 11/15/16 1706 11/15/16 2342 11/16/16 0538  TROPONINI 0.03* <0.03 0.03* 0.03*   CBG (last 3)  No results for input(s): GLUCAP in the last 72 hours. No results found for this or any previous  visit (from the past 240 hour(s)).   Studies: Dg Abd 1 View  Result Date: 11/15/2016 CLINICAL DATA:  Abdominal pain EXAM: ABDOMEN - 1 VIEW COMPARISON:  None. FINDINGS: The bowel gas pattern is normal. No radio-opaque calculi or other significant radiographic abnormality are seen. IMPRESSION: Negative. Electronically Signed   By: Deatra Robinson M.D.   On: 11/15/2016 20:31   Mr Lumbar Spine Wo Contrast  Result Date: 11/16/2016 CLINICAL DATA:  Back pain EXAM: MRI LUMBAR SPINE WITHOUT CONTRAST TECHNIQUE: Multiplanar, multisequence MR imaging of the lumbar spine was performed. No intravenous contrast was administered. COMPARISON:  Lumbar spine radiograph 11/15/2016, 10/24/2016 Lumbar spine MRI 03/23/2011 FINDINGS: The examination had to be discontinued prior to completion due to patient pain and inability CT tolerate the full study. Only T1 and T2 weighted sagittal sequences were acquired. There is a compression fracture of the L1 vertebral body with approximately 60% height loss centrally. There is a superior endplate compression fracture at T12 with approximately 20% house. The L1 fracture is new compared to the radiograph 10/24/2016, but unchanged compared to the radiograph from 11/15/2016. There is no significant retropulsion. There are small disc bulges at L2-L3, L3-L4, L4-L5 and L5-S1. At L3-L4, this results in at least moderate spinal canal stenosis. There is severe left neural foraminal stenosis at L4-L5, predominantly caused by severe facet hypertrophy. There is moderate neural foraminal stenosis at right L1-L2, left L3-L4 and left L5-S1. IMPRESSION: 1. Truncated examination due to patient pain. Only 2 sagittal sequences were acquired. No axial imaging was acquired. 2. Compression fracture of the L1 vertebral body with approximately 60% height loss. This does not result in significant retropulsion and does not cause spinal canal stenosis. Superior endplate fracture of T12 with approximately 20% height  loss. 3. At least moderate spinal canal stenosis at L3-L4 secondary to disc bulge. This is unchanged from the MRI of 03/31/2011 4. Severe left neural foraminal stenosis at L4-L5, mostly caused by facet arthrosis. This is unchanged compared to 03/23/2011 5. Multilevel neural foraminal stenosis that is moderate, including right L1-2, left L3-4 and left L5-S1. Electronically Signed   By: Deatra Robinson M.D.   On: 11/16/2016 20:33   Scheduled Meds: . allopurinol  100 mg Oral Daily  . clotrimazole   Topical BID  . digoxin  125 mcg Oral Daily  . enoxaparin (LOVENOX) injection  40 mg Subcutaneous Q24H  . feeding supplement (ENSURE ENLIVE)  237 mL Oral QHS  . fentaNYL      . flumazenil      . FLUoxetine  20 mg Oral Daily  . iopamidol      . lidocaine (PF)      . lisinopril  5 mg Oral Daily  . metoprolol tartrate  25 mg Oral BID  . midazolam       Continuous Infusions: . ceFAZolin      Active Problems:   Essential hypertension, benign   Atrial fibrillation (HCC)   Low back pain   Depression with anxiety   Chronic diastolic CHF (congestive heart failure) (HCC)   Recurrent falls   Chronic pain   Lesion of right lung   Intractable vomiting   Compression fracture of first lumbar vertebra (HCC)  Time spent:   Standley Dakins, MD, FAAFP [facility] Pager [phone]  If 7PM-7AM, please contact night-coverage [email or web address] Password [email or web address] 11/17/2016, 4:40 PM    LOS: 1 day

## 2016-11-17 NOTE — Consult Note (Signed)
Chief Complaint: Patient was seen in consultation today for  Lumbar 1 kyphoplasty/vertebroplasty Chief Complaint  Patient presents with  . Back Pain  . Emesis   at the request of Dr Maryln Manuel  Supervising Physician: Julieanne Cotton  Patient Status: [facility] - In-pt  History of Present Illness: [name] is a 83 y.o. female   Pt has had 2 falls at home in recent months Admitted to Hospital through ED 8./15/18 for intractable pain ; N/V Has been seeing pain clinic for weeks Recent UTI--- will recheck today Hx Afib---no AC: LD Lovenox in hospital last night 630 pm Back pain is "awful"--pain meds no help MRI:  IMPRESSION: 1. Truncated examination due to patient pain. Only 2 sagittal sequences were acquired. No axial imaging was acquired. 2. Compression fracture of the L1 vertebral body with approximately 60% height loss. This does not result in significant retropulsion and does not cause spinal canal stenosis. Superior endplate fracture of T12 with approximately 20% height loss. 3. At least moderate spinal canal stenosis at L3-L4 secondary to disc bulge. This is unchanged from the MRI of 03/31/2011 4. Severe left neural foraminal stenosis at L4-L5, mostly caused by facet arthrosis. This is unchanged compared to 03/23/2011 5. Multilevel neural foraminal stenosis that is moderate, including right L1-2, left L3-4 and left L5-S1.  Request for Vertebroplasty/kyphoplasty from [facility] Dr Corliss Skains has reviewed imaging and approves procedure   Past Medical History:  Diagnosis Date  . Arthritis   . Atherosclerosis of abdominal aorta (HCC)    by xray  . Atrial fibrillation (HCC)   . CAD (coronary artery disease)    95% LAD stenosis, [place] x 02 September 1996  . Cervicalgia   . CHF (congestive heart failure) (HCC) 2014   on recent hospitalization  . Chronic interstitial lung disease (HCC) 08/2015   by CXR  . Depression with anxiety    celexa started 04/2010  . Dyslipidemia     . Facial trauma 06/01/2014   Fall 05/2014 without fracture by facial/head CT   . Gout    prior PCP stopped allopurinol  . Hyperlipidemia   . Hypertension   . LBP (low back pain) 2005   spinal stenosis and bulging discs at L2-L5 s/p lumbar laminectomy [facility])  . Lymphocytic colitis 10/28/2013  . Osteopenia 07/2013   T score -2.2 L femur  . Peripheral neuropathy since feb 2015   right arm   . Prediabetes 2001  . Syncopal episodes 2015   orthostatic, normal EEG and neuro eval ([place])  . T12 compression fracture (HCC) 2015   by CT    Past Surgical History:  Procedure Laterality Date  . CARDIAC CATHETERIZATION    . CATARACT EXTRACTION Bilateral   . COLONOSCOPY WITH PROPOFOL N/A 10/16/2013   Rachael Fee, MD  . DEXA  07/2013   T score -2.2 L femur  . INCISION AND DRAINAGE Right 02/06/2016   Procedure: INCISION AND DRAINAGE right middle finger;  Surgeon: Juanell Fairly, MD;  Location: [facility] ORS;  Service: Orthopedics;  Laterality: Right;  . LUMBAR LAMINECTOMY  2005   Dr. [facility] Quint  . PERCUTANEOUS CORONARY STENT INTERVENTION (PCI-S)     stent x3 in heart  . TONSILLECTOMY    . TUBAL LIGATION      Allergies: Coumadin [warfarin sodium]; Adhesive [tape]; Ivp dye [iodinated diagnostic agents]; Vytorin [ezetimibe-simvastatin]; and Iodides  Medications: Prior to Admission medications   Medication Sig Start Date End Date Taking? Authorizing Provider  allopurinol (ZYLOPRIM) 100 MG tablet  Take 100 mg by mouth daily.   Yes [provider]  aspirin EC 81 MG tablet Take 1 tablet (81 mg total) by mouth daily. 04/14/15  Yes Eustaquio Boyden, MD  atorvastatin (LIPITOR) 40 MG tablet Take 40 mg by mouth daily.   Yes [provider]  clotrimazole-betamethasone (LOTRISONE) cream Apply 1 application topically 2 (two) times daily. Apply externally BID for 2 wks 08/16/16  Yes Copland, Alicia B, PA-C  digoxin (LANOXIN) 0.125 MG tablet TAKE ONE TABLET BY MOUTH EVERY DAY 10/09/16  Yes  Eustaquio Boyden, MD  FLUoxetine (PROZAC) 20 MG capsule Take 20 mg by mouth daily.   Yes [provider]  furosemide (LASIX) 20 MG tablet Take 0.5 tablets (10 mg total) by mouth daily. Patient taking differently: Take 10 mg by mouth as needed.  11/14/16  Yes Rollene Rotunda, MD  lisinopril (PRINIVIL,ZESTRIL) 5 MG tablet Take 5 mg by mouth daily.   Yes [provider]  metoprolol tartrate (LOPRESSOR) 25 MG tablet TAKE ONE TABLET BY MOUTH TWICE DAILY 06/19/16  Yes Eustaquio Boyden, MD  Multiple Vitamins-Minerals (ONE-A-DAY WOMENS 50+ ADVANTAGE) TABS Take 1 tablet by mouth daily.    Yes [provider]  naproxen (NAPROSYN) 500 MG tablet Take 500 mg by mouth 2 (two) times daily with a meal.   Yes [provider]  nystatin cream (MYCOSTATIN) APPLY TOPICALLY TWICE A DAY 08/17/16  Yes Eustaquio Boyden, MD  oxyCODONE (OXY IR/ROXICODONE) 5 MG immediate release tablet Take 5 mg by mouth as needed for severe pain.   Yes [provider]  nitroGLYCERIN (NITROSTAT) 0.4 MG SL tablet Place 0.4 mg under the tongue every 5 (five) minutes as needed for chest pain.    [provider]     Family History  Problem Relation Age of Onset  . Prostate cancer Father   . Breast cancer Sister 58  . Heart disease Brother   . Heart disease Other        First degree relatives with heart disease but later onset  . Heart disease Sister        pacer  . Esophageal cancer Neg Hx   . Colon cancer Neg Hx     Social History   Social History  . Marital status: Widowed    Spouse name: N/A  . Number of children: 1  . Years of education: N/A   Occupational History  .  Retired   Social History Main Topics  . Smoking status: Never Smoker  . Smokeless tobacco: Never Used  . Alcohol use Yes     Comment: Occasionally drinks beer  . Drug use: No  . Sexual activity: Not Asked   Other Topics Concern  . 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  All other systems are negative.  Review of Systems  Constitutional: Positive for activity change. Negative for appetite change, fatigue and fever.  Respiratory: Negative for shortness of breath.   Gastrointestinal: Positive for nausea. Negative for abdominal pain.  Genitourinary: Negative for dysuria.  Musculoskeletal: Positive for back pain and gait problem.  Neurological: Positive for weakness.  Psychiatric/Behavioral: Negative for behavioral problems and confusion.    Vital Signs: BP (!) 154/73 (BP Location: Right Arm)  Pulse 71   Temp 98.5 F (36.9 C)   Resp 18   Ht 5\' 5"  (1.651 m)   Wt 119 lb 11.2 oz (54.3 kg)   SpO2 97%   BMI 19.92 kg/m   Physical Exam  Constitutional: She is oriented to person, place, and time.  Cardiovascular: Normal rate and regular rhythm.   Pulmonary/Chest: Effort normal and breath sounds normal.  Abdominal: Soft. Bowel sounds are normal. There is no tenderness.  Musculoskeletal: Normal range of motion.  Painful to palpate low back  Neurological: She is alert and oriented to person, place, and time.  Skin: Skin is warm and dry.  Psychiatric: She has a normal mood and affect. Her behavior is normal. Thought content normal.  Nursing note and vitals reviewed.   Mallampati Score:  MD Evaluation Airway: WNL Heart: WNL Abdomen: WNL Chest/ Lungs: WNL ASA  Classification: 3 Mallampati/Airway Score: Two  Imaging: Dg Chest 2 View  Result Date: 11/15/2016 CLINICAL DATA:  Severe back pain since a recent fall on approximately 10/22/2016. EXAM: CHEST  2 VIEW COMPARISON:  PA and lateral chest 08/03/2015. Lumbar spine plain films 10/24/2016. FINDINGS: Peripheral fibrotic change bilaterally is stable in appearance. No consolidative process,  pneumothorax or effusion. Heart size is normal. Aortic atherosclerosis is seen. Remote T12 compression fracture is identified. The patient also has an L1 compression fracture which is seen on the prior lumbar spine plain films. The degree of compression has worsened since the prior plain films and the patient now has a vertebral plana deformity anteriorly. IMPRESSION: Worsened appearance of an L1 compression fracture since the recent lumbar spine plain films with vertebra plana deformity now present. Chronic, mild T12 superior endplate compression fracture. No acute cardiopulmonary disease with changes of pulmonary fibrosis noted. Aortic atherosclerosis. Electronically Signed   By: Drusilla Kanner M.D.   On: 11/15/2016 15:07   Dg Lumbar Spine 2-3 Views  Result Date: 11/15/2016 CLINICAL DATA:  Recent fall with low back pain, initial encounter EXAM: LUMBAR SPINE - 2-3 VIEW COMPARISON:  10/24/2016 FINDINGS: Stable T12 compression deformity is noted. The L1 compression deformity seen on the recent exam has increased by approximately 50% height loss. No other deformity is noted. Mild osteophytic changes are seen. Diffuse aortic calcifications are noted without aneurysmal dilatation. IMPRESSION: Increase in L1 compression deformity. Electronically Signed   By: Alcide Clever M.D.   On: 11/15/2016 15:51   Dg Lumbar Spine Complete  Result Date: 10/24/2016 CLINICAL DATA:  Low back pain after a fall. EXAM: LUMBAR SPINE - COMPLETE 4+ VIEW COMPARISON:  08/03/2015 FINDINGS: Five lumbar type vertebral bodies. Minimal convex left lumbar spine curvature. Mild osteopenia. Abdominal aortic atherosclerosis. Interval mild L1 compression deformity, without ventral canal encroachment. Moderate T12 compression deformity is not significantly changed. Degenerate disc disease at L4-5 and L5-S1. Facet arthropathy at the lumbosacral junction. IMPRESSION: Interval development of a L1 compression deformity since 08/03/2015. Chronic T12  compression deformity. Lumbosacral spondylosis. Aortic Atherosclerosis (ICD10-I70.0). Electronically Signed   By: Jeronimo Greaves M.D.   On: 10/24/2016 20:58   Dg Abd 1 View  Result Date: 11/15/2016 CLINICAL DATA:  Abdominal pain EXAM: ABDOMEN - 1 VIEW COMPARISON:  None. FINDINGS: The bowel gas pattern is normal. No radio-opaque calculi or other significant radiographic abnormality are seen. IMPRESSION: Negative. Electronically Signed   By: Deatra Robinson M.D.   On: 11/15/2016 20:31   Mr Lumbar Spine Wo Contrast  Result Date: 11/16/2016 CLINICAL DATA:  Back pain EXAM: MRI LUMBAR SPINE WITHOUT CONTRAST TECHNIQUE: Multiplanar,  multisequence MR imaging of the lumbar spine was performed. No intravenous contrast was administered. COMPARISON:  Lumbar spine radiograph 11/15/2016, 10/24/2016 Lumbar spine MRI 03/23/2011 FINDINGS: The examination had to be discontinued prior to completion due to patient pain and inability CT tolerate the full study. Only T1 and T2 weighted sagittal sequences were acquired. There is a compression fracture of the L1 vertebral body with approximately 60% height loss centrally. There is a superior endplate compression fracture at T12 with approximately 20% house. The L1 fracture is new compared to the radiograph 10/24/2016, but unchanged compared to the radiograph from 11/15/2016. There is no significant retropulsion. There are small disc bulges at L2-L3, L3-L4, L4-L5 and L5-S1. At L3-L4, this results in at least moderate spinal canal stenosis. There is severe left neural foraminal stenosis at L4-L5, predominantly caused by severe facet hypertrophy. There is moderate neural foraminal stenosis at right L1-L2, left L3-L4 and left L5-S1. IMPRESSION: 1. Truncated examination due to patient pain. Only 2 sagittal sequences were acquired. No axial imaging was acquired. 2. Compression fracture of the L1 vertebral body with approximately 60% height loss. This does not result in significant  retropulsion and does not cause spinal canal stenosis. Superior endplate fracture of T12 with approximately 20% height loss. 3. At least moderate spinal canal stenosis at L3-L4 secondary to disc bulge. This is unchanged from the MRI of 03/31/2011 4. Severe left neural foraminal stenosis at L4-L5, mostly caused by facet arthrosis. This is unchanged compared to 03/23/2011 5. Multilevel neural foraminal stenosis that is moderate, including right L1-2, left L3-4 and left L5-S1. Electronically Signed   By: Deatra Robinson M.D.   On: 11/16/2016 20:33   Dg Hip Unilat With Pelvis 2-3 Views Right  Result Date: 11/15/2016 CLINICAL DATA:  Recent fall with back pain and hip pain, initial encounter EXAM: DG HIP (WITH OR WITHOUT PELVIS) 2-3V RIGHT COMPARISON:  None. FINDINGS: The pelvic ring is intact. Degenerative changes in the lumbar spine and hip joints are seen. No acute fracture or dislocation is noted. IMPRESSION: No acute abnormality noted. Electronically Signed   By: Alcide Clever M.D.   On: 11/15/2016 15:52    Labs:  CBC:  Recent Labs  07/21/16 1133 10/24/16 1741 11/15/16 1151 11/16/16 0538  WBC 7.9 14.0* 10.2 6.8  HGB 12.3 12.2 12.2 10.2*  HCT 37.3 37.0 36.0 30.9*  PLT 204.0 210 518* 390    COAGS:  Recent Labs  11/15/16 1301  INR 0.96    BMP:  Recent Labs  02/08/16 0335 07/21/16 1133 10/24/16 1741 11/15/16 1151 11/16/16 0538  NA 139 140 140 139 141  K 3.7 3.5 3.5 3.0* 3.6  CL 106 103 105 104 113*  CO2 25 28 24  20* 22  GLUCOSE 111* 120* 126* 109* 73  BUN 10 9 25* 8 5*  CALCIUM 8.5* 9.6 9.6 8.9 7.7*  CREATININE 0.80 0.87 0.91 1.08* 0.81  GFRNONAA >60  --  57* 46* >60  GFRAA >60  --  >60 54* >60    LIVER FUNCTION TESTS:  Recent Labs  02/04/16 1629 07/21/16 1133 10/24/16 1741 11/15/16 1151  BILITOT 0.8 0.8 1.0 1.6*  AST 28 21 28 27   ALT 15 11 17 16   ALKPHOS 123 99 88 116  PROT 7.3 6.5 6.3* 6.3*  ALBUMIN 3.9 4.0 3.8 3.0*    TUMOR MARKERS: No results for  input(s): AFPTM, CEA, CA199, CHROMGRNA in the last 8760 hours.  Assessment and Plan:  Severe low back pain Acute L1 fx Scheduled  for VP/KP Risks and benefits discussed with the patient including, but not limited to education regarding the natural healing process of compression fractures without intervention, bleeding, infection, cement migration which may cause spinal cord damage, paralysis, pulmonary embolism or even death. All of the patient's questions were answered, patient is agreeable to proceed. Consent signed and in chart.  Thank you for this interesting consult.  I greatly enjoyed meeting [name] and look forward to participating in their care.  A copy of this report was sent to the requesting provider on this date.  Electronically Signed: Robet Leu, PA-C 11/17/2016, 9:56 AM   I spent a total of 55 Miinutes    in face to face in clinical consultation, greater than 50% of which was counseling/coordinating care for L1 VP/KP

## 2016-11-17 NOTE — Evaluation (Signed)
Occupational Therapy Evaluation Patient Details Name: [name] MRN: [id number] DOB: 1932/10/02 Today's Date: 11/17/2016    History of Present Illness Pt is an 83 y/o female admitted from PCP secondary intractable nausea. Pt also with fall and Lumbar imaging showed increase in L1 compression deformity. Per note and RN, compression fx to be managed outpatient. PMH includes HTN, a fib, chronic back pain and L1 compression fx, dCHF, and lesion in R lung.    Clinical Impression   Pt admitted secondary to problem above with deficits below.  PTA, pt was independent with ADLs and functional mobility with use of rollater. However, pt does report multiple falls which included "blacking out."  She even reports a fall while she was showering, resulting falling out of tub.  During evaluation, she c/o back pain which limited her participation in session.  She reports that she lives with her son but that he "does not know how to help" her with ADLs. Pt is refusing SNF (states "they will mess up my finances") and wants to go home.  Pt will need 24/7 supervision upon return home and assist with ADLs as needed. She may benefit from a home health aide if possible.           Follow Up Recommendations  Home health OT;Supervision/Assistance - 24 hour; Homehealth aide   Equipment Recommendations  3 in 1 bedside commode    Recommendations for Other Services       Precautions / Restrictions Precautions Precautions: Fall Precaution Comments: Has had 2 falls at home. Reports she "blacks out." Encouraged to speak with MD about symptoms.  Restrictions Weight Bearing Restrictions: No      Mobility Bed Mobility Overal bed mobility: Needs Assistance Bed Mobility: Sidelying to Sit;Sit to Sidelying;Rolling Rolling: Supervision Sidelying to sit: Supervision;HOB elevated     Sit to sidelying: Supervision;HOB elevated General bed mobility comments: Use of bed rail throughout bed mobility.  Verbal cues  for log roll technique to minimize discomfort.  Transfers Overall transfer level: Needs assistance Equipment used: Rolling Supan (2 wheeled) Transfers: Sit to/from Stand Sit to Stand: Min guard         General transfer comment: Min guard for sit to stand and to take steps toward [facility] (pt declining transfer OOB this morning).    Balance Overall balance assessment: Needs assistance Sitting-balance support: No upper extremity supported;Feet supported Sitting balance-Leahy Scale: Good     Standing balance support: Bilateral upper extremity supported Standing balance-Leahy Scale: Poor Standing balance comment: Reliant on UE support on RW.                            ADL either performed or assessed with clinical judgement   ADL Overall ADL's : Needs assistance/impaired Eating/Feeding: Independent   Grooming: Min guard;Standing;Wash/dry hands   Upper Body Bathing: Set up;Sitting   Lower Body Bathing: Minimal assistance;Sit to/from stand   Upper Body Dressing : Set up;Sitting   Lower Body Dressing: Minimal assistance;Sit to/from stand   Toilet Transfer: Ambulation;Comfort height toilet;Min guard   Toileting- Clothing Manipulation and Hygiene: Minimal assistance;Sit to/from stand       Functional mobility during ADLs: Rolling Polito;Cueing for safety;Min guard       Vision Patient Visual Report: No change from baseline       Perception     Praxis      Pertinent Vitals/Pain Pain Assessment: 0-10 Pain Score: 8  Pain Location: back Pain Descriptors / Indicators:  Aching Pain Intervention(s): Limited activity within patient's tolerance     Hand Dominance Right   Extremity/Trunk Assessment Upper Extremity Assessment Upper Extremity Assessment: Generalized weakness       Cervical / Trunk Assessment Cervical / Trunk Assessment: Kyphotic   Communication Communication Communication: No difficulties   Cognition Arousal/Alertness:  Awake/alert Behavior During Therapy: WFL for tasks assessed/performed Overall Cognitive Status: No family/caregiver present to determine baseline cognitive functioning                                     General Comments       Exercises     Shoulder Instructions      Home Living Family/patient expects to be discharged to:: Private residence Living Arrangements: Children Available Help at Discharge: Family;Available 24 hours/day Type of Home: House Home Access: Stairs to enter [employer] of Steps: 2 Entrance Stairs-Rails: None Home Layout: One level     Bathroom Shower/Tub: Chief Strategy Officer: Standard     Home Equipment: Cane - single point;Wheelchair - [employer] - 4 wheels          Prior Functioning/Environment Level of Independence: Independent with assistive device(s)                 OT Problem List: Decreased strength;Decreased activity tolerance;Impaired balance (sitting and/or standing);Decreased safety awareness;Pain      OT Treatment/Interventions: Self-care/ADL training;Energy conservation;Therapeutic activities;DME and/or AE instruction;Patient/family education    OT Goals(Current goals can be found in the care plan section) Acute Rehab OT Goals Patient Stated Goal: to feel better  OT Goal Formulation: With patient Time For Goal Achievement: 12/01/16 Potential to Achieve Goals: Good  OT Frequency: Min 2X/week   Barriers to D/C:    Patient reports son is home 24/7 with her but does not know how to assist her with ADLs (son not present during evaluation).       Co-evaluation              AM-PAC PT "6 Clicks" Daily Activity     Outcome Measure Help from another person eating meals?: None Help from another person taking care of personal grooming?: A Little Help from another person toileting, which includes using toliet, bedpan, or urinal?: A Little Help from another person bathing (including  washing, rinsing, drying)?: A Little Help from another person to put on and taking off regular upper body clothing?: A Little Help from another person to put on and taking off regular lower body clothing?: A Little 6 Click Score: 19   End of Session Equipment Utilized During Treatment: Gait belt;Rolling Watchman  Activity Tolerance: Patient limited by pain Patient left: in bed;with call bell/phone within reach;with bed alarm set  OT Visit Diagnosis: Unsteadiness on feet (R26.81);Pain Pain - part of body:  (back)                Time: 0830-0900 OT Time Calculation (min): 30 min Charges:  OT General Charges $OT Visit: 1 Procedure OT Evaluation $OT Eval Moderate Complexity: 1 Procedure OT Treatments $Therapeutic Activity: 8-22 mins     Cipriano Mile OTR/L 11/17/2016, 9:19 AM

## 2016-11-17 NOTE — Procedures (Signed)
S/P L1 VP 

## 2016-11-17 NOTE — Care Management Note (Signed)
Case Management Note  Patient Details  Name: [name] MRN: [id number] Date of Birth: Aug 05, 1932  Subjective/Objective:                  Admitted with intractable vomiting, hx afib, CAD, dyslipidemia, depression, anxiety, chronic back pain secondary to known L1 compression fx being managed by pain clinic and PCP.From home with son.  PCP; Karl Ito  Action/Plan:  Kyphoplasty today 8/17... Plan is to d/c to home with home health services when medically stable.  Expected Discharge Date:    11/17/2016           Expected Discharge Plan:  Home w Home Health Services  In-House Referral:     Discharge planning Services  CM Consult  Post Acute Care Choice:    Choice offered to:     DME Arranged:  3-N-1 DME Agency:  [facility] CM made referral to Lupita Leash. 3 in1/BSC will be delivered to bedside  HH Arranged:  PT, Social Work,Nurse Aide [facility] Agency:  [facility], referral made with Lupita Leash @ [phone]  Status of Service:     If discussed at Long Length of Stay Meetings, dates discussed:    Additional Comments:  Epifanio Lesches, RN 11/17/2016, 1:48 PM

## 2016-11-17 NOTE — Sedation Documentation (Signed)
Pt. Audibly snoring. O2 sats waveforn is dampened. Fingers cool to touch. Pt. Not arousing to voice or light shoulder stimulation. Checking equipment. Attempting to warm fingers.

## 2016-11-17 NOTE — Sedation Documentation (Addendum)
Pt. Unresponsive to voice. Snoring. O2sats low, fingers cool to touch. Pt. In prone position.  Skin color is pink. MD asked for romazicon 0.25mg .

## 2016-11-17 NOTE — Sedation Documentation (Signed)
Pt. VS stable. Pt transferred to Radiology nurses station for recovery and observation.

## 2016-11-18 LAB — URINE CULTURE

## 2016-11-18 MED ORDER — OXYCODONE HCL 5 MG PO TABS: 5.0000 mg | ORAL_TABLET | Freq: Four times a day (QID) | ORAL | 0 refills | Status: AC | PRN

## 2016-11-18 MED ORDER — FUROSEMIDE 20 MG PO TABS: 10.0000 mg | ORAL_TABLET | ORAL | Status: AC | PRN

## 2016-11-18 NOTE — Discharge Instructions (Signed)
Balloon Kyphoplasty, Care After °Refer to this sheet in the next few weeks. These instructions provide you with information about caring for yourself after your procedure. Your health care provider may also give you more specific instructions. Your treatment has been planned according to current medical practices, but problems sometimes occur. Call your health care provider if you have any problems or questions after your procedure. °What can I expect after the procedure? °After your procedure, it is common to have back pain. °Follow these instructions at home: °Incision care °· Follow instructions from your health care provider about how to take care of your incisions. Make sure you: °? Wash your hands with soap and water before you change your bandage (dressing). If soap and water are not available, use hand sanitizer. °? Change your dressing as told by your health care provider. °? Leave stitches (sutures), skin glue, or adhesive strips in place. These skin closures may need to be in place for 2 weeks or longer. If adhesive strip edges start to loosen and curl up, you may trim the loose edges. Do not remove adhesive strips completely unless your health care provider tells you to do that. °· Check your incision area every day for signs of infection. Watch for: °? Redness, swelling, or pain. °? Fluid, blood, or pus. °· Keep your dressing dry until your health care provider says that it can be removed. °Activity ° °· Rest your back and avoid intense physical activity for as long as told by your health care provider. °· Return to your normal activities as told by your health care provider. Ask your health care provider what activities are safe for you. °· Do not lift anything that is heavier than 10 lb (4.5 kg). This is about the weight of a gallon of milk. You may need to avoid heavy lifting for several weeks. °General instructions °· Take over-the-counter and prescription medicines only as told by your health care  provider. °· If directed, apply ice to the painful area: °? Put ice in a plastic bag. °? Place a towel between your skin and the bag. °? Leave the ice on for 20 minutes, 2-3 times per day. °· Do not use tobacco products, including cigarettes, chewing tobacco, or e-cigarettes. If you need help quitting, ask your health care provider. °· Keep all follow-up visits as told by your health care provider. This is important. °Contact a health care provider if: °· You have a fever. °· You have redness, swelling, or pain at the site of your incisions. °· You have fluid, blood, or pus coming from your incisions. °· You have pain that gets worse or does not get better with medicine. °· You develop numbness or weakness in any part of your body. °Get help right away if: °· You have chest pain. °· You have difficulty breathing. °· You cannot move your legs. °· You cannot control your bladder or bowel movements. °· You suddenly become weak or numb on one side of your body. °· You become very confused. °· You have trouble speaking or understanding, or both. °This information is not intended to replace advice given to you by your health care provider. Make sure you discuss any questions you have with your health care provider. °Document Released: 12/09/2014 Document Revised: 08/26/2015 Document Reviewed: 07/13/2014 °Elsevier Interactive Patient Education © 2018 Elsevier Inc. °Moderate Conscious Sedation, Adult, Care After °These instructions provide you with information about caring for yourself after your procedure. Your health care provider may also give you   more specific instructions. Your treatment has been planned according to current medical practices, but problems sometimes occur. Call your health care provider if you have any problems or questions after your procedure. What can I expect after the procedure? After your procedure, it is common:  To feel sleepy for several hours.  To feel clumsy and have poor balance for  several hours.  To have poor judgment for several hours.  To vomit if you eat too soon.  Follow these instructions at home: For at least 24 hours after the procedure:   Do not: ? Participate in activities where you could fall or become injured. ? Drive. ? Use heavy machinery. ? Drink alcohol. ? Take sleeping pills or medicines that cause drowsiness. ? Make important decisions or sign legal documents. ? Take care of children on your own.  Rest. Eating and drinking  Follow the diet recommended by your health care provider.  If you vomit: ? Drink water, juice, or soup when you can drink without vomiting. ? Make sure you have little or no nausea before eating solid foods. General instructions  Have a responsible adult stay with you until you are awake and alert.  Take over-the-counter and prescription medicines only as told by your health care provider.  If you smoke, do not smoke without supervision.  Keep all follow-up visits as told by your health care provider. This is important. Contact a health care provider if:  You keep feeling nauseous or you keep vomiting.  You feel light-headed.  You develop a rash.  You have a fever. Get help right away if:  You have trouble breathing. This information is not intended to replace advice given to you by your health care provider. Make sure you discuss any questions you have with your health care provider. Document Released: 01/08/2013 Document Revised: 08/23/2015 Document Reviewed: 07/10/2015 Elsevier Interactive Patient Education  2018 [employer].    Follow with Primary MD  Sherry Boyden, MD  and other consultant's as instructed your Hospitalist MD  Please get a complete blood count and chemistry panel checked by your Primary MD at your next visit, and again as instructed by your Primary MD.  Get Medicines reviewed and adjusted: Please take all your medications with you for your next visit with your Primary  MD  Laboratory/radiological data: Please request your Primary MD to go over all hospital tests and procedure/radiological results at the follow up, please ask your Primary MD to get all Hospital records sent to his/her office.  In some cases, they will be blood work, cultures and biopsy results pending at the time of your discharge. Please request that your primary care M.D. follows up on these results.  Also Note the following: If you experience worsening of your admission symptoms, develop shortness of breath, life threatening emergency, suicidal or homicidal thoughts you must seek medical attention immediately by calling 911 or calling your MD immediately  if symptoms less severe.  You must read complete instructions/literature along with all the possible adverse reactions/side effects for all the Medicines you take and that have been prescribed to you. Take any new Medicines after you have completely understood and accpet all the possible adverse reactions/side effects.   Do not drive when taking Pain medications or sleeping medications (Benzodaizepines)  Do not take more than prescribed Pain, Sleep and Anxiety Medications. It is not advisable to combine anxiety,sleep and pain medications without talking with your primary care practitioner  Special Instructions: If you have smoked or chewed  Tobacco  in the last 2 yrs please stop smoking, stop any regular Alcohol  and or any Recreational drug use.  Wear Seat belts while driving.  Please note: You were cared for by a hospitalist during your hospital stay. Once you are discharged, your primary care physician will handle any further medical issues. Please note that NO REFILLS for any discharge medications will be authorized once you are discharged, as it is imperative that you return to your primary care physician (or establish a relationship with a primary care physician if you do not have one) for your post hospital discharge needs so that  they can reassess your need for medications and monitor your lab values.

## 2016-11-18 NOTE — Progress Notes (Signed)
Notified by telemetry at 0315 of multiple pauses from 0300-0315, with the longest pause being 2.71 seconds.  Patient is asymptomatic and asleep.  Bl[place], NP notified.  Will continue to monitor.

## 2016-11-18 NOTE — Progress Notes (Signed)
Physical Therapy Treatment Patient Details Name: [name] MRN: [id number] DOB: January 22, 1933 Today's Date: 11/18/2016    History of Present Illness Pt is an 83 y/o female admitted from PCP secondary intractable nausea. Pt also with fall and Lumbar imaging showed increase in L1 compression deformity. s/p kyphoplasty 8/17. PMH includes HTN, a fib, chronic back pain and L1 compression fx, dCHF, and lesion in R lung.     PT Comments    Patient progressing well towards PT goals. Tolerated gait training with Min guard assist for safety. Reluctant to use RW but discussed importance for pain control and fall reduction. Pt agreeable only if it is temporary. Fatigues quickly after walk. VSS. Eager to return home and return to independence. Recommend maximizing HH services. Will follow.   Follow Up Recommendations  Home health PT;Supervision/Assistance - 24 hour [facility] aide)     Equipment Recommendations  None recommended by PT    Recommendations for Other Services       Precautions / Restrictions Precautions Precautions: Fall Precaution Comments: Has had 2 falls at home. Reports she "blacks out." Encouraged to speak with MD about symptoms.  Restrictions Weight Bearing Restrictions: No    Mobility  Bed Mobility Overal bed mobility: Needs Assistance Bed Mobility: Rolling;Sidelying to Sit Rolling: Supervision Sidelying to sit: Supervision;HOB elevated       General bed mobility comments: Use of bed rail throughout bed mobility.  Verbal cues for log roll technique to minimize discomfort.  Transfers Overall transfer level: Needs assistance Equipment used: Rolling [place] (2 wheeled) Transfers: Sit to/from Stand Sit to Stand: Min guard         General transfer comment: Min guard for safety. Stood from [employer], from toilet x1.   Ambulation/Gait Ambulation/Gait assistance: Min guard Ambulation Distance (Feet): 150 Feet Assistive device: Rolling [place] (2 wheeled) Gait  Pattern/deviations: Step-through pattern;Decreased stride length;Trunk flexed;Decreased stance time - right;Antalgic Gait velocity: Decreased   General Gait Details: Slow, antalgic gait. Instability noted and pt reluctant to use RW. Verbal cues for upright posture and RW proximity. Decreased weightshift to RLE secondary to pain.    Stairs            Wheelchair Mobility    Modified Rankin (Stroke Patients Only)       Balance Overall balance assessment: Needs assistance Sitting-balance support: No upper extremity supported;Feet supported Sitting balance-Leahy Scale: Good Sitting balance - Comments: Able to perform pericare without difficulty.    Standing balance support: During functional activity;Single extremity supported   Standing balance comment: Able to wash hands at sink without difficulty. Does better with UE support.                            Cognition Arousal/Alertness: Awake/alert Behavior During Therapy: WFL for tasks assessed/performed Overall Cognitive Status: No family/caregiver present to determine baseline cognitive functioning                                 General Comments: Pt with some decreased safety awareness, however, no family/caregiver present to determine baseline.       Exercises      General Comments        Pertinent Vitals/Pain Pain Assessment: 0-10 Pain Score: 7  Pain Location: back Pain Descriptors / Indicators: Aching;Sore Pain Intervention(s): Monitored during session;Repositioned    Home Living  Prior Function            PT Goals (current goals can now be found in the care plan section) Progress towards PT goals: Progressing toward goals    Frequency    Min 3X/week      PT Plan Current plan remains appropriate    Co-evaluation              AM-PAC PT "6 Clicks" Daily Activity  Outcome Measure  Difficulty turning over in bed (including adjusting  bedclothes, sheets and blankets)?: None Difficulty moving from lying on back to sitting on the side of the bed? : None Difficulty sitting down on and standing up from a chair with arms (e.g., wheelchair, bedside commode, etc,.)?: None Help needed moving to and from a bed to chair (including a wheelchair)?: A Little Help needed walking in hospital room?: A Little Help needed climbing 3-5 steps with a railing? : A Little 6 Click Score: 21    End of Session Equipment Utilized During Treatment: Gait belt Activity Tolerance: Patient limited by fatigue Patient left: in bed;with call bell/phone within reach;with bed alarm set Nurse Communication: Mobility status PT Visit Diagnosis: Unsteadiness on feet (R26.81);Muscle weakness (generalized) (M62.81);Repeated falls (R29.6);Pain Pain - Right/Left: Right Pain - part of body: Hip (back)     Time: 1006-1030 PT Time Calculation (min) (ACUTE ONLY): 24 min  Charges:  $Gait Training: 8-22 mins $Therapeutic Activity: 8-22 mins                    G Codes:       Mylo Red, PT, DPT [phone]     [name] 11/18/2016, 11:02 AM

## 2016-11-18 NOTE — Progress Notes (Signed)
Referring Physician(s): Dr. Standley Dakins  Supervising Physician: Julieanne Cotton  Patient Status: [facility] - In-pt  Chief Complaint: L1 compression fracture  Subjective: Patient still with some pain today, but has ambulated already twice today.  Allergies: Coumadin [warfarin sodium]; Adhesive [tape]; Ivp dye [iodinated diagnostic agents]; Vytorin [ezetimibe-simvastatin]; and Iodides  Medications: Prior to Admission medications   Medication Sig Start Date End Date Taking? Authorizing Provider  allopurinol (ZYLOPRIM) 100 MG tablet Take 100 mg by mouth daily.   Yes [provider]  aspirin EC 81 MG tablet Take 1 tablet (81 mg total) by mouth daily. 04/14/15  Yes Eustaquio Boyden, MD  atorvastatin (LIPITOR) 40 MG tablet Take 40 mg by mouth daily.   Yes [provider]  clotrimazole-betamethasone (LOTRISONE) cream Apply 1 application topically 2 (two) times daily. Apply externally BID for 2 wks 08/16/16  Yes Copland, Alicia B, PA-C  digoxin (LANOXIN) 0.125 MG tablet TAKE ONE TABLET BY MOUTH EVERY DAY 10/09/16  Yes Eustaquio Boyden, MD  FLUoxetine (PROZAC) 20 MG capsule Take 20 mg by mouth daily.   Yes [provider]  lisinopril (PRINIVIL,ZESTRIL) 5 MG tablet Take 5 mg by mouth daily.   Yes [provider]  metoprolol tartrate (LOPRESSOR) 25 MG tablet TAKE ONE TABLET BY MOUTH TWICE DAILY 06/19/16  Yes Eustaquio Boyden, MD  Multiple Vitamins-Minerals (ONE-A-DAY WOMENS 50+ ADVANTAGE) TABS Take 1 tablet by mouth daily.    Yes [provider]  naproxen (NAPROSYN) 500 MG tablet Take 500 mg by mouth 2 (two) times daily with a meal.   Yes [provider]  nystatin cream (MYCOSTATIN) APPLY TOPICALLY TWICE A DAY 08/17/16  Yes Eustaquio Boyden, MD  furosemide (LASIX) 20 MG tablet Take 0.5 tablets (10 mg total) by mouth as needed. 11/18/16   Johnson, Clanford L, MD  nitroGLYCERIN (NITROSTAT) 0.4 MG SL tablet Place 0.4 mg under the tongue every  5 (five) minutes as needed for chest pain.    [provider]  oxyCODONE (OXY IR/ROXICODONE) 5 MG immediate release tablet Take 1 tablet (5 mg total) by mouth every 6 (six) hours as needed for severe pain. 11/18/16   Johnson, Clanford L, MD    Vital Signs: BP (!) 123/48 (BP Location: Right Arm)   Pulse 60   Temp 98 F (36.7 C) (Oral)   Resp 18   Ht 5\' 5"  (1.651 m)   Wt 119 lb 11.2 oz (54.3 kg)   SpO2 96%   BMI 19.92 kg/m   Physical Exam: Back: KP site is c/d/i.  She is nontender over the site.  Imaging: Dg Chest 2 View  Result Date: 11/15/2016 CLINICAL DATA:  Severe back pain since a recent fall on approximately 10/22/2016. EXAM: CHEST  2 VIEW COMPARISON:  PA and lateral chest 08/03/2015. Lumbar spine plain films 10/24/2016. FINDINGS: Peripheral fibrotic change bilaterally is stable in appearance. No consolidative process, pneumothorax or effusion. Heart size is normal. Aortic atherosclerosis is seen. Remote T12 compression fracture is identified. The patient also has an L1 compression fracture which is seen on the prior lumbar spine plain films. The degree of compression has worsened since the prior plain films and the patient now has a vertebral plana deformity anteriorly. IMPRESSION: Worsened appearance of an L1 compression fracture since the recent lumbar spine plain films with vertebra plana deformity now present. Chronic, mild T12 superior endplate compression fracture. No acute cardiopulmonary disease with changes of pulmonary fibrosis noted. Aortic atherosclerosis. Electronically Signed   By: Drusilla Kanner M.D.  On: 11/15/2016 15:07   Dg Lumbar Spine 2-3 Views  Result Date: 11/15/2016 CLINICAL DATA:  Recent fall with low back pain, initial encounter EXAM: LUMBAR SPINE - 2-3 VIEW COMPARISON:  10/24/2016 FINDINGS: Stable T12 compression deformity is noted. The L1 compression deformity seen on the recent exam has increased by approximately 50% height loss. No other  deformity is noted. Mild osteophytic changes are seen. Diffuse aortic calcifications are noted without aneurysmal dilatation. IMPRESSION: Increase in L1 compression deformity. Electronically Signed   By: Alcide Clever M.D.   On: 11/15/2016 15:51   Dg Abd 1 View  Result Date: 11/15/2016 CLINICAL DATA:  Abdominal pain EXAM: ABDOMEN - 1 VIEW COMPARISON:  None. FINDINGS: The bowel gas pattern is normal. No radio-opaque calculi or other significant radiographic abnormality are seen. IMPRESSION: Negative. Electronically Signed   By: Deatra Robinson M.D.   On: 11/15/2016 20:31   Mr Lumbar Spine Wo Contrast  Result Date: 11/16/2016 CLINICAL DATA:  Back pain EXAM: MRI LUMBAR SPINE WITHOUT CONTRAST TECHNIQUE: Multiplanar, multisequence MR imaging of the lumbar spine was performed. No intravenous contrast was administered. COMPARISON:  Lumbar spine radiograph 11/15/2016, 10/24/2016 Lumbar spine MRI 03/23/2011 FINDINGS: The examination had to be discontinued prior to completion due to patient pain and inability CT tolerate the full study. Only T1 and T2 weighted sagittal sequences were acquired. There is a compression fracture of the L1 vertebral body with approximately 60% height loss centrally. There is a superior endplate compression fracture at T12 with approximately 20% house. The L1 fracture is new compared to the radiograph 10/24/2016, but unchanged compared to the radiograph from 11/15/2016. There is no significant retropulsion. There are small disc bulges at L2-L3, L3-L4, L4-L5 and L5-S1. At L3-L4, this results in at least moderate spinal canal stenosis. There is severe left neural foraminal stenosis at L4-L5, predominantly caused by severe facet hypertrophy. There is moderate neural foraminal stenosis at right L1-L2, left L3-L4 and left L5-S1. IMPRESSION: 1. Truncated examination due to patient pain. Only 2 sagittal sequences were acquired. No axial imaging was acquired. 2. Compression fracture of the L1  vertebral body with approximately 60% height loss. This does not result in significant retropulsion and does not cause spinal canal stenosis. Superior endplate fracture of T12 with approximately 20% height loss. 3. At least moderate spinal canal stenosis at L3-L4 secondary to disc bulge. This is unchanged from the MRI of 03/31/2011 4. Severe left neural foraminal stenosis at L4-L5, mostly caused by facet arthrosis. This is unchanged compared to 03/23/2011 5. Multilevel neural foraminal stenosis that is moderate, including right L1-2, left L3-4 and left L5-S1. Electronically Signed   By: Deatra Robinson M.D.   On: 11/16/2016 20:33   Dg Hip Unilat With Pelvis 2-3 Views Right  Result Date: 11/15/2016 CLINICAL DATA:  Recent fall with back pain and hip pain, initial encounter EXAM: DG HIP (WITH OR WITHOUT PELVIS) 2-3V RIGHT COMPARISON:  None. FINDINGS: The pelvic ring is intact. Degenerative changes in the lumbar spine and hip joints are seen. No acute fracture or dislocation is noted. IMPRESSION: No acute abnormality noted. Electronically Signed   By: Alcide Clever M.D.   On: 11/15/2016 15:52    Labs:  CBC:  Recent Labs  07/21/16 1133 10/24/16 1741 11/15/16 1151 11/16/16 0538  WBC 7.9 14.0* 10.2 6.8  HGB 12.3 12.2 12.2 10.2*  HCT 37.3 37.0 36.0 30.9*  PLT 204.0 210 518* 390    COAGS:  Recent Labs  11/15/16 1301  INR 0.96  BMP:  Recent Labs  02/08/16 0335 07/21/16 1133 10/24/16 1741 11/15/16 1151 11/16/16 0538  NA 139 140 140 139 141  K 3.7 3.5 3.5 3.0* 3.6  CL 106 103 105 104 113*  CO2 25 28 24  20* 22  GLUCOSE 111* 120* 126* 109* 73  BUN 10 9 25* 8 5*  CALCIUM 8.5* 9.6 9.6 8.9 7.7*  CREATININE 0.80 0.87 0.91 1.08* 0.81  GFRNONAA >60  --  57* 46* >60  GFRAA >60  --  >60 54* >60    LIVER FUNCTION TESTS:  Recent Labs  02/04/16 1629 07/21/16 1133 10/24/16 1741 11/15/16 1151  BILITOT 0.8 0.8 1.0 1.6*  AST 28 21 28 27   ALT 15 11 17 16   ALKPHOS 123 99 88 116  PROT  7.3 6.5 6.3* 6.3*  ALBUMIN 3.9 4.0 3.8 3.0*    Assessment and Plan: 1. S/p L1 KP for compression fracture  Patient is doing well today.  She is still having pain, but this is not unexpected.  It may take up to 2 weeks to feel the full effects of the procedure.  This was relayed to her.  She is stable for DC home from our standpoint.  She has been instructed not to lift over 10lbs for 2 weeks.  She should also avoid stooping or bending as well.  She has a [place] and is encouraged to use this at home as well.  Electronically Signed: Letha Cape 11/18/2016, 12:33 PM   I spent a total of 15 Minutes at the the patient's bedside AND on the patient's hospital floor or unit, greater than 50% of which was counseling/coordinating care for l1 compression fracture

## 2016-11-18 NOTE — Progress Notes (Addendum)
Occupational Therapy Treatment Patient Details Name: [name] MRN: [id number] DOB: Jul 10, 1932 Today's Date: 11/18/2016    History of present illness Pt is an 83 y/o female admitted from PCP secondary intractable nausea. Pt also with fall and Lumbar imaging showed increase in L1 compression deformity. s/p kyphoplasty 8/17. PMH includes HTN, a fib, chronic back pain and L1 compression fx, dCHF, and lesion in R lung.    OT comments  Pt progressing towards acute OT goals. Focus of session was UB/LB dressing and education on fall risk reduction during ADLS. 10/10 pain in R hip/back at end of session.  D/c plan remains appropriate.    Follow Up Recommendations  Home health OT;Supervision/Assistance - 24 hour    Equipment Recommendations  3 in 1 bedside commode    Recommendations for Other Services      Precautions / Restrictions Precautions Precautions: Fall Precaution Comments: Has had 2 falls at home. Reports she "blacks out." Encouraged to speak with MD about symptoms.  Restrictions Weight Bearing Restrictions: No       Mobility Bed Mobility Overal bed mobility: Needs Assistance Bed Mobility: Rolling;Sidelying to Sit;Sit to Sidelying Rolling: Supervision Sidelying to sit: Min assist;HOB elevated     Sit to sidelying: Supervision;HOB elevated General bed mobility comments: used bed rail and HHA to powerup to EOB.  Transfers Overall transfer level: Needs assistance Equipment used: Rolling [place] (2 wheeled) Transfers: Sit to/from Stand Sit to Stand: Min guard         General transfer comment: min guard for safety. from EOB 2x.    Balance Overall balance assessment: Needs assistance Sitting-balance support: No upper extremity supported;Feet supported Sitting balance-Leahy Scale: Good Sitting balance - Comments: tied shoes sitting EOB   Standing balance support: During functional activity;Single extremity supported Standing balance-Leahy Scale:  Poor Standing balance comment: Does better with UE support.                           ADL either performed or assessed with clinical judgement   ADL Overall ADL's : Needs assistance/impaired                 Upper Body Dressing : Set up;Sitting   Lower Body Dressing: Min guard;Sit to/from stand               Functional mobility during ADLs: Rolling [place];Cueing for safety;Min guard General ADL Comments: Pt completed bed mobility, UB/LB dressing, and household-community distance functional mobility as detailed above. Pt with increased pain in R hip and back (10/10) after session. Discussed safety with ADLs (letting someone else walk her dog, shower transfer technique, etc).      Vision       Perception     Praxis      Cognition Arousal/Alertness: Awake/alert Behavior During Therapy: WFL for tasks assessed/performed Overall Cognitive Status: No family/caregiver present to determine baseline cognitive functioning                                 General Comments: Pt with some decreased safety awareness, however, no family/caregiver present to determine baseline.  Tangential responses at times.        Exercises     Shoulder Instructions       General Comments      Pertinent Vitals/ Pain       Pain Assessment: 0-10 Pain Score: 10-Worst pain ever Pain Location: R  hip and back after walking in hallway Pain Descriptors / Indicators: Aching;Sore Pain Intervention(s): Limited activity within patient's tolerance;Monitored during session;Repositioned;Patient requesting pain meds-RN notified  Home Living                                          Prior Functioning/Environment              Frequency  Min 2X/week        Progress Toward Goals  OT Goals(current goals can now be found in the care plan section)  Progress towards OT goals: Progressing toward goals  Acute Rehab OT Goals Patient Stated Goal: to feel  better  OT Goal Formulation: With patient Time For Goal Achievement: 12/01/16 Potential to Achieve Goals: Good ADL Goals Pt Will Perform Grooming: with supervision;standing Pt Will Perform Upper Body Dressing: with supervision;sitting Pt Will Perform Lower Body Dressing: with supervision;sit to/from stand Pt Will Transfer to Toilet: with supervision;ambulating;regular height toilet Pt Will Perform Toileting - Clothing Manipulation and hygiene: with supervision;sit to/from stand Pt Will Perform Tub/Shower Transfer: Tub transfer;with supervision;3 in 1;rolling [place]  Plan Discharge plan remains appropriate    Co-evaluation                 AM-PAC PT "6 Clicks" Daily Activity     Outcome Measure   Help from another person eating meals?: None Help from another person taking care of personal grooming?: A Little Help from another person toileting, which includes using toliet, bedpan, or urinal?: A Little Help from another person bathing (including washing, rinsing, drying)?: A Little Help from another person to put on and taking off regular upper body clothing?: None Help from another person to put on and taking off regular lower body clothing?: None 6 Click Score: 21    End of Session Equipment Utilized During Treatment: Rolling [place]  OT Visit Diagnosis: Unsteadiness on feet (R26.81);Pain;History of falling (Z91.81) Pain - Right/Left: Right Pain - part of body: Hip   Activity Tolerance Patient tolerated treatment well;Patient limited by pain   Patient Left in bed;with call bell/phone within reach;with bed alarm set   Nurse Communication Patient requests pain meds        Time: 1110-1144 OT Time Calculation (min): 34 min  Charges: OT General Charges $OT Visit: 1 Procedure OT Evaluation $OT Eval Low Complexity: OT Treatments $Self Care/Home Management : 23-37 mins     Pilar Grammes 11/18/2016, 12:16 PM

## 2016-11-18 NOTE — Discharge Summary (Addendum)
Physician Discharge Summary  [name] JYN:[id number] DOB: Dec 05, 1932 DOA: 11/15/2016  PCP: Eustaquio Boyden, MD Cardiology: [facility]  Admit date: 11/15/2016 Discharge date: 11/18/2016  Admitted From: Home  Disposition: Home with home health services, (refused SNF)   Recommendations for Outpatient Follow-up:  1. Follow up with PCP in 1 weeks 2. Follow up with pain management specialist  Home Health: as ordered SW, PT, OT, Aide  Discharge Condition: STABLE   CODE STATUS: FULL    Brief Hospitalization Summary: Please see all hospital notes, images, labs for full details of the hospitalization.  HPI: [name] is a 83 y.o. female with hx afib, CAD, dyslipidemia, depression, anxiety, chronic back pain secondary to known L1 compression fx being managed by pain clinic and PCP. Pt presented to PCP today with intractable vomiting x 4-5 days. PCP note reports recent hospitalization at [facility] 8/10 through 11/12/16 d/t ongoing back pain. Hospital course complicated by UTI and pt tx'd with Rocephin. Urine cx negative. Pt is poor historian and uncertain if she was d/c'd on antibiotic. Son is not present on admission evaluation. She denies any pain with urination or urinary frequency. She has been on several recent antibiotics for right forearm cellulitis. Pt was recently tried on Naprosyn for chronic pain that was not tolerated well. Per PCP note, Oxycodone recently weaned from 10mg  TID to 5mg  TID.   ED COURSE On admission eval pt with dry heaving now relieved with IV phenergan. CBC wnl (no differential obtained), K+ 3.0, LFTs wnl, Tbili 1.6. Troponin I 0.03. Chest xray with no evidence of infx process. Pt's only complaint now is chronic back pain.   MDM/Assessment & Plan:    N/V - resolved now -pt reports symptoms persistent x 5 days. D/c'd from [facility] on 8/12 per PCP note.  -abdominal exam benign, but bilious emesis on initial exam -symptoms resolved with supportive therapy -  tolerated regular diet  Hypokalemia -secondary to above -repleted IV.  AFib -rate controlled. Per PCP note, not an anticoagulant candidate given recurrent falls. Resume asa when pos -dig level normal   Hx hypertension -stable.   Known closed compression fx L1 -acute and acutely worse in xray done today -continue pain control -outpt follow-up with pain management  - IR to do kyphoplasty procedure 8/17, pt tolerated the procedure well.  - Plan discharge home 8/18  Right lung lesion -incidental finding on chest xray at [facility].  -follow up per PCP  Code Status:full code DVT Prophylaxis: SQ Lovenox Family Communication:none available at bedside on exam Disposition Plan:discharge home 8/18 with Home health services, refused SNF  Consultants:  I.R.   Discharge Diagnoses:  Active Problems:   Essential hypertension, benign   Atrial fibrillation (HCC)   Low back pain   Depression with anxiety   Chronic diastolic CHF (congestive heart failure) (HCC)   Recurrent falls   Chronic pain   Lesion of right lung   Intractable vomiting   Compression fracture of first lumbar vertebra [facility])  Discharge Instructions: Discharge Instructions    Call MD for:  difficulty breathing, headache or visual disturbances    Complete by:  As directed    Call MD for:  extreme fatigue    Complete by:  As directed    Call MD for:  persistant dizziness or light-headedness    Complete by:  As directed    Call MD for:  persistant nausea and vomiting    Complete by:  As directed    Call MD for:  redness, tenderness, or signs of infection (pain, swelling, redness, odor or green/yellow discharge around incision site)    Complete by:  As directed    Call MD for:  severe uncontrolled pain    Complete by:  As directed    Call MD for:  temperature >100.4    Complete by:  As directed    Increase activity slowly    Complete by:  As directed      Allergies as of 11/18/2016      Reactions    Coumadin [warfarin Sodium] Other (See Comments)   Makes pass out   Adhesive [tape] Rash   Ivp Dye [iodinated Diagnostic Agents] Other (See Comments)   Rash and turns purple Rash and turns purple   Vytorin [ezetimibe-simvastatin] Other (See Comments)   Other leg cramps   Iodides Rash      Medication List    TAKE these medications   allopurinol 100 MG tablet Commonly known as:  ZYLOPRIM Take 100 mg by mouth daily.   aspirin EC 81 MG tablet Take 1 tablet (81 mg total) by mouth daily.   atorvastatin 40 MG tablet Commonly known as:  LIPITOR Take 40 mg by mouth daily.   clotrimazole-betamethasone cream Commonly known as:  LOTRISONE Apply 1 application topically 2 (two) times daily. Apply externally BID for 2 wks   digoxin 0.125 MG tablet Commonly known as:  LANOXIN TAKE ONE TABLET BY MOUTH EVERY DAY   FLUoxetine 20 MG capsule Commonly known as:  PROZAC Take 20 mg by mouth daily.   furosemide 20 MG tablet Commonly known as:  LASIX Take 0.5 tablets (10 mg total) by mouth as needed.   lisinopril 5 MG tablet Commonly known as:  PRINIVIL,ZESTRIL Take 5 mg by mouth daily.   metoprolol tartrate 25 MG tablet Commonly known as:  LOPRESSOR TAKE ONE TABLET BY MOUTH TWICE DAILY   naproxen 500 MG tablet Commonly known as:  NAPROSYN Take 500 mg by mouth 2 (two) times daily with a meal.   nitroGLYCERIN 0.4 MG SL tablet Commonly known as:  NITROSTAT Place 0.4 mg under the tongue every 5 (five) minutes as needed for chest pain.   nystatin cream Commonly known as:  MYCOSTATIN APPLY TOPICALLY TWICE A DAY   ONE-A-DAY WOMENS 50+ ADVANTAGE Tabs Take 1 tablet by mouth daily.   oxyCODONE 5 MG immediate release tablet Commonly known as:  Oxy IR/ROXICODONE Take 1 tablet (5 mg total) by mouth every 6 (six) hours as needed for severe pain. What changed:  when to take this            Durable Medical Equipment        Start     Ordered   11/16/16 1543  For home use only DME  3 n 1  Once     11/16/16 1542     Follow-up Information    [facility] Follow [facility].   Why:  home health services arranged , office will call and arranged home visits Contact information: [address] [place] [address] [phone]        Eustaquio Boyden, MD. Schedule an appointment as soon as possible for a visit in 4 day(s).   Specialty:  Family Medicine Why:  Hospital Follow Up Contact information: [address] [place] [address] [phone]        Rollene Rotunda, MD. Schedule an appointment as soon as possible for a visit in 2 week(s).   Specialty:  Cardiology Why:  Hospital Follow Up Contact information: [address] [place] [address] [phone]          Allergies  Allergen Reactions  . Coumadin [Warfarin Sodium] Other (See Comments)    Makes pass out  . Adhesive [Tape] Rash  . Ivp Dye [Iodinated Diagnostic Agents] Other (See Comments)    Rash and turns purple Rash and turns purple  . Vytorin [Ezetimibe-Simvastatin] Other (See Comments)    Other leg cramps  . Iodides Rash   Current Discharge Medication List    CONTINUE these medications which have CHANGED   Details  furosemide (LASIX) 20 MG tablet Take 0.5 tablets (10 mg total) by mouth as needed.    oxyCODONE (OXY IR/ROXICODONE) 5 MG immediate release tablet Take 1 tablet (5 mg total) by mouth every 6 (six) hours as needed for severe pain. Qty: 12 tablet, Refills: 0      CONTINUE these medications which have NOT CHANGED   Details  allopurinol (ZYLOPRIM) 100 MG tablet Take 100 mg by mouth daily.    aspirin EC 81 MG tablet Take 1 tablet (81 mg total) by mouth daily.    atorvastatin (LIPITOR) 40 MG tablet Take 40 mg by mouth daily.    clotrimazole-betamethasone (LOTRISONE) cream Apply 1 application topically 2 (two) times daily. Apply externally BID for 2 wks Qty: 15 g, Refills: 0   Associated Diagnoses: Vaginal burning     digoxin (LANOXIN) 0.125 MG tablet TAKE ONE TABLET BY MOUTH EVERY DAY Qty: 30 tablet, Refills: 5    FLUoxetine (PROZAC) 20 MG capsule Take 20 mg by mouth daily.    lisinopril (PRINIVIL,ZESTRIL) 5 MG tablet Take 5 mg by mouth daily.    metoprolol tartrate (LOPRESSOR) 25 MG tablet TAKE ONE TABLET BY MOUTH TWICE DAILY Qty: 60 tablet, Refills: 3    Multiple Vitamins-Minerals (ONE-A-DAY WOMENS 50+ ADVANTAGE) TABS Take 1 tablet by mouth daily.     naproxen (NAPROSYN) 500 MG tablet Take 500 mg by mouth 2 (two) times daily with a meal.    nystatin cream (MYCOSTATIN) APPLY TOPICALLY TWICE A DAY Qty: 30 g, Refills: 1    nitroGLYCERIN (NITROSTAT) 0.4 MG SL tablet Place 0.4 mg under the tongue every 5 (five) minutes as needed for chest pain.       Procedures/Studies: Dg Chest 2 View  Result Date: 11/15/2016 CLINICAL DATA:  Severe back pain since a recent fall on approximately 10/22/2016. EXAM: CHEST  2 VIEW COMPARISON:  PA and lateral chest 08/03/2015. Lumbar spine plain films 10/24/2016. FINDINGS: Peripheral fibrotic change bilaterally is stable in appearance. No consolidative process, pneumothorax or effusion. Heart size is normal. Aortic atherosclerosis is seen. Remote T12 compression fracture is identified. The patient also has an L1 compression fracture which is seen on the prior lumbar spine plain films. The degree of compression has worsened since the prior plain films and the patient now has a vertebral plana deformity anteriorly. IMPRESSION: Worsened appearance of an L1 compression fracture since the recent lumbar spine plain films with vertebra plana deformity now present. Chronic, mild T12 superior endplate compression fracture. No acute cardiopulmonary disease with changes of pulmonary fibrosis noted. Aortic atherosclerosis. Electronically Signed   By: Drusilla Kanner M.D.   On: 11/15/2016 15:07   Dg Lumbar Spine 2-3 Views  Result Date: 11/15/2016 CLINICAL DATA:  Recent fall with low  back pain, initial encounter EXAM: LUMBAR SPINE - 2-3 VIEW COMPARISON:  10/24/2016 FINDINGS: Stable T12 compression deformity is noted. The L1 compression deformity seen on  the recent exam has increased by approximately 50% height loss. No other deformity is noted. Mild osteophytic changes are seen. Diffuse aortic calcifications are noted without aneurysmal dilatation. IMPRESSION: Increase in L1 compression deformity. Electronically Signed   By: Alcide Clever M.D.   On: 11/15/2016 15:51   Dg Lumbar Spine Complete  Result Date: 10/24/2016 CLINICAL DATA:  Low back pain after a fall. EXAM: LUMBAR SPINE - COMPLETE 4+ VIEW COMPARISON:  08/03/2015 FINDINGS: Five lumbar type vertebral bodies. Minimal convex left lumbar spine curvature. Mild osteopenia. Abdominal aortic atherosclerosis. Interval mild L1 compression deformity, without ventral canal encroachment. Moderate T12 compression deformity is not significantly changed. Degenerate disc disease at L4-5 and L5-S1. Facet arthropathy at the lumbosacral junction. IMPRESSION: Interval development of a L1 compression deformity since 08/03/2015. Chronic T12 compression deformity. Lumbosacral spondylosis. Aortic Atherosclerosis (ICD10-I70.0). Electronically Signed   By: Jeronimo Greaves M.D.   On: 10/24/2016 20:58   Dg Abd 1 View  Result Date: 11/15/2016 CLINICAL DATA:  Abdominal pain EXAM: ABDOMEN - 1 VIEW COMPARISON:  None. FINDINGS: The bowel gas pattern is normal. No radio-opaque calculi or other significant radiographic abnormality are seen. IMPRESSION: Negative. Electronically Signed   By: Deatra Robinson M.D.   On: 11/15/2016 20:31   Mr Lumbar Spine Wo Contrast  Result Date: 11/16/2016 CLINICAL DATA:  Back pain EXAM: MRI LUMBAR SPINE WITHOUT CONTRAST TECHNIQUE: Multiplanar, multisequence MR imaging of the lumbar spine was performed. No intravenous contrast was administered. COMPARISON:  Lumbar spine radiograph 11/15/2016, 10/24/2016 Lumbar spine MRI 03/23/2011  FINDINGS: The examination had to be discontinued prior to completion due to patient pain and inability CT tolerate the full study. Only T1 and T2 weighted sagittal sequences were acquired. There is a compression fracture of the L1 vertebral body with approximately 60% height loss centrally. There is a superior endplate compression fracture at T12 with approximately 20% house. The L1 fracture is new compared to the radiograph 10/24/2016, but unchanged compared to the radiograph from 11/15/2016. There is no significant retropulsion. There are small disc bulges at L2-L3, L3-L4, L4-L5 and L5-S1. At L3-L4, this results in at least moderate spinal canal stenosis. There is severe left neural foraminal stenosis at L4-L5, predominantly caused by severe facet hypertrophy. There is moderate neural foraminal stenosis at right L1-L2, left L3-L4 and left L5-S1. IMPRESSION: 1. Truncated examination due to patient pain. Only 2 sagittal sequences were acquired. No axial imaging was acquired. 2. Compression fracture of the L1 vertebral body with approximately 60% height loss. This does not result in significant retropulsion and does not cause spinal canal stenosis. Superior endplate fracture of T12 with approximately 20% height loss. 3. At least moderate spinal canal stenosis at L3-L4 secondary to disc bulge. This is unchanged from the MRI of 03/31/2011 4. Severe left neural foraminal stenosis at L4-L5, mostly caused by facet arthrosis. This is unchanged compared to 03/23/2011 5. Multilevel neural foraminal stenosis that is moderate, including right L1-2, left L3-4 and left L5-S1. Electronically Signed   By: Deatra Robinson M.D.   On: 11/16/2016 20:33   Dg Hip Unilat With Pelvis 2-3 Views Right  Result Date: 11/15/2016 CLINICAL DATA:  Recent fall with back pain and hip pain, initial encounter EXAM: DG HIP (WITH OR WITHOUT PELVIS) 2-3V RIGHT COMPARISON:  None. FINDINGS: The pelvic ring is intact. Degenerative changes in the lumbar  spine and hip joints are seen. No acute fracture or dislocation is noted. IMPRESSION: No acute abnormality noted. Electronically Signed   By: Eulah Pont.D.  On: 11/15/2016 15:52     Subjective: Pt tolerated procedure well.  Tolerated diet well.   Discharge Exam: Vitals:   11/18/16 0447 11/18/16 0448  BP: (!) 123/48   Pulse: (!) 39 60  Resp: 18   Temp: 98 F (36.7 C)   SpO2: 96% 96%   Vitals:   11/17/16 2104 11/18/16 0325 11/18/16 0447 11/18/16 0448  BP: 124/69 (!) 118/58 (!) 123/48   Pulse: (!) 50 (!) 53 (!) 39 60  Resp: 18  18   Temp: 98.4 F (36.9 C)  98 F (36.7 C)   TempSrc: Oral  Oral   SpO2: 97%  96% 96%  Weight:      Height:       General: Pt is alert, awake, not in acute distress Cardiovascular: RRR, S1/S2 +, no rubs, no gallops Respiratory: CTA bilaterally, no wheezing, no rhonchi Abdominal: Soft, NT, ND, bowel sounds + Extremities: no edema, no cyanosis   The results of significant diagnostics from this hospitalization (including imaging, microbiology, ancillary and laboratory) are listed below for reference.    Microbiology: No results found for this or any previous visit (from the past 240 hour(s)).   Labs: BNP (last 3 results) No results for input(s): BNP in the last 8760 hours. Basic Metabolic Panel:  Recent Labs Lab 11/15/16 1151 11/16/16 0538  NA 139 141  K 3.0* 3.6  CL 104 113*  CO2 20* 22  GLUCOSE 109* 73  BUN 8 5*  CREATININE 1.08* 0.81  CALCIUM 8.9 7.7*   Liver Function Tests:  Recent Labs Lab 11/15/16 1151  AST 27  ALT 16  ALKPHOS 116  BILITOT 1.6*  PROT 6.3*  ALBUMIN 3.0*    Recent Labs Lab 11/15/16 1151  LIPASE 23   No results for input(s): AMMONIA in the last 168 hours. CBC:  Recent Labs Lab 11/15/16 1151 11/16/16 0538  WBC 10.2 6.8  HGB 12.2 10.2*  HCT 36.0 30.9*  MCV 93.0 96.6  PLT 518* 390   Cardiac Enzymes:  Recent Labs Lab 11/15/16 1301 11/15/16 1706 11/15/16 2342 11/16/16 0538   TROPONINI 0.03* <0.03 0.03* 0.03*   BNP: Invalid input(s): POCBNP CBG: No results for input(s): GLUCAP in the last 168 hours. D-Dimer No results for input(s): DDIMER in the last 72 hours. Hgb A1c No results for input(s): HGBA1C in the last 72 hours. Lipid Profile No results for input(s): CHOL, HDL, LDLCALC, TRIG, CHOLHDL, LDLDIRECT in the last 72 hours. Thyroid function studies No results for input(s): TSH, T4TOTAL, T3FREE, THYROIDAB in the last 72 hours.  Invalid input(s): FREET3 Anemia work up No results for input(s): VITAMINB12, FOLATE, FERRITIN, TIBC, IRON, RETICCTPCT in the last 72 hours. Urinalysis    Component Value Date/Time   COLORURINE YELLOW 11/17/2016 1140   APPEARANCEUR CLEAR 11/17/2016 1140   APPEARANCEUR Clear 05/31/2013 2346   LABSPEC 1.018 11/17/2016 1140   LABSPEC 1.012 05/31/2013 2346   PHURINE 5.0 11/17/2016 1140   GLUCOSEU NEGATIVE 11/17/2016 1140   GLUCOSEU Negative 05/31/2013 2346   HGBUR NEGATIVE 11/17/2016 1140   BILIRUBINUR NEGATIVE 11/17/2016 1140   BILIRUBINUR negative 08/01/2016 1544   BILIRUBINUR Negative 05/31/2013 2346   KETONESUR NEGATIVE 11/17/2016 1140   PROTEINUR NEGATIVE 11/17/2016 1140   UROBILINOGEN 0.2 08/01/2016 1544   NITRITE NEGATIVE 11/17/2016 1140   LEUKOCYTESUR TRACE (A) 11/17/2016 1140   LEUKOCYTESUR Negative 05/31/2013 2346   Sepsis Labs Invalid input(s): PROCALCITONIN,  WBC,  LACTICIDVEN Microbiology No results found for this or any previous visit (from the past  240 hour(s)).  Time coordinating discharge: 31 minutes  SIGNED:  Standley Dakins, MD  [facility] 11/18/2016, 10:19 AM Pager [phone]  If 7PM-7AM, please contact night-coverage [email or web address] Password [email or web address]

## 2016-11-20 ENCOUNTER — Encounter (HOSPITAL_COMMUNITY): Payer: Self-pay | Admitting: Interventional Radiology

## 2016-11-20 ENCOUNTER — Telehealth: Payer: Self-pay | Admitting: Family Medicine

## 2016-11-20 NOTE — Telephone Encounter (Signed)
PT called to set up appt after surgery. She was released Saturday and is in a lot of pain but needs to follow up with PCP. There aren't any 30 min appts this week, when can we sch.  She said her BP was low last night, doesn't have exact reading.

## 2016-11-20 NOTE — Telephone Encounter (Signed)
plz schedule Thursday at 11am 30 min appt.

## 2016-11-20 NOTE — Telephone Encounter (Signed)
I called pt and spoke with son Carollee Herter. He said they will be here 11am 11/23/16.

## 2016-11-21 ENCOUNTER — Telehealth: Payer: Self-pay

## 2016-11-21 NOTE — Telephone Encounter (Signed)
Attempted to reach patient to complete TCM, VM full.

## 2016-11-23 ENCOUNTER — Ambulatory Visit (INDEPENDENT_AMBULATORY_CARE_PROVIDER_SITE_OTHER): Payer: Medicare Other | Admitting: Family Medicine

## 2016-11-23 NOTE — Telephone Encounter (Signed)
Pt and son Carollee Herter presented at front office for check in at 11:00 am this morning.  Appointment was supposed to have been scheduled for today, but due to error, computer jumped to PCP's next available appointment which was Monday 11/27/16 at 11 am.  Checked with scheduler CE, she confirmed that it should have been today.  Dr. Sharen Hones wants to see patient and will accommodate at end of his morning.  Informed patient and son that would be a wait or they could come back in about one hour.  Patient and son were very angry and stated that "I am going to get a better doctor".  I explained that this was a clerical error, pt stated that "this happens every time I come here.  I am going to get another doctor and it's not going to be [place] [facility] [place] [facility]".  Patient continued to berate [employer] loudly in the front office and to other patients.  She said to tell PCP that she was getting another doctor and she was going to be seen today and she would not be back.

## 2016-11-23 NOTE — Telephone Encounter (Signed)
I'm sorry this mistake was made. We offered to take care of her today but she was not satisfied with our offer.  It sounds like she is firing our office as her medical provider. Please remove me as PCP.  We are happy to facilitate finding new PCP if pt desires and will send records wherever she requests. Letter will be sent.

## 2016-11-24 ENCOUNTER — Other Ambulatory Visit: Payer: Self-pay

## 2016-11-24 MED ORDER — LISINOPRIL 5 MG PO TABS: 5.0000 mg | ORAL_TABLET | Freq: Every day | ORAL | 0 refills | Status: DC

## 2016-11-24 NOTE — Telephone Encounter (Signed)
It looks like this med was last filled 04/04/16 with 3 refills... Please advise if okay to refill

## 2016-11-27 ENCOUNTER — Ambulatory Visit: Payer: Medicare Other | Admitting: Family Medicine

## 2016-11-29 ENCOUNTER — Other Ambulatory Visit: Payer: Self-pay | Admitting: *Deleted

## 2016-11-29 MED ORDER — METOPROLOL TARTRATE 25 MG PO TABS: 25.0000 mg | ORAL_TABLET | Freq: Two times a day (BID) | ORAL | 3 refills | Status: DC

## 2016-11-30 ENCOUNTER — Encounter: Payer: Self-pay | Admitting: Family Medicine

## 2016-12-06 ENCOUNTER — Other Ambulatory Visit (HOSPITAL_COMMUNITY): Payer: Self-pay | Admitting: Interventional Radiology

## 2016-12-06 DIAGNOSIS — S32010D Wedge compression fracture of first lumbar vertebra, subsequent encounter for fracture with routine healing: Secondary | ICD-10-CM

## 2016-12-11 DIAGNOSIS — Z79891 Long term (current) use of opiate analgesic: Secondary | ICD-10-CM | POA: Diagnosis not present

## 2016-12-11 DIAGNOSIS — M961 Postlaminectomy syndrome, not elsewhere classified: Secondary | ICD-10-CM | POA: Diagnosis not present

## 2016-12-11 DIAGNOSIS — M79641 Pain in right hand: Secondary | ICD-10-CM | POA: Diagnosis not present

## 2016-12-11 DIAGNOSIS — S22009S Unspecified fracture of unspecified thoracic vertebra, sequela: Secondary | ICD-10-CM | POA: Diagnosis not present

## 2016-12-11 DIAGNOSIS — G894 Chronic pain syndrome: Secondary | ICD-10-CM | POA: Diagnosis not present

## 2016-12-19 ENCOUNTER — Telehealth: Payer: Self-pay | Admitting: Family Medicine

## 2016-12-19 ENCOUNTER — Other Ambulatory Visit (HOSPITAL_COMMUNITY): Payer: Self-pay | Admitting: Interventional Radiology

## 2016-12-19 ENCOUNTER — Telehealth (HOSPITAL_COMMUNITY): Payer: Self-pay

## 2016-12-19 ENCOUNTER — Ambulatory Visit (HOSPITAL_COMMUNITY)
Admission: RE | Admit: 2016-12-19 | Discharge: 2016-12-19 | Disposition: A | Discharge status: Home / Self Care | Payer: Medicare Other | Source: Ambulatory Visit | Attending: Interventional Radiology | Admitting: Interventional Radiology

## 2016-12-19 DIAGNOSIS — Z9889 Other specified postprocedural states: Secondary | ICD-10-CM | POA: Diagnosis not present

## 2016-12-19 DIAGNOSIS — S32010D Wedge compression fracture of first lumbar vertebra, subsequent encounter for fracture with routine healing: Secondary | ICD-10-CM

## 2016-12-19 DIAGNOSIS — M4857XS Collapsed vertebra, not elsewhere classified, lumbosacral region, sequela of fracture: Secondary | ICD-10-CM | POA: Diagnosis not present

## 2016-12-19 DIAGNOSIS — M545 Low back pain: Secondary | ICD-10-CM | POA: Diagnosis not present

## 2016-12-19 HISTORY — PX: IR RADIOLOGIST EVAL & MGMT: IMG5224

## 2016-12-19 NOTE — Telephone Encounter (Signed)
Called, no answer, no vm. AW 

## 2016-12-19 NOTE — Telephone Encounter (Signed)
Received signed domestic return receipt verifying delivery of certified letter on December 08, 2016. Article number [id number] daj  Patient dismissed from [facility] by Eustaquio Boyden MD , effective November 30, 2016. Dismissal letter sent out by certified / registered mail.  daj

## 2016-12-20 ENCOUNTER — Encounter (HOSPITAL_COMMUNITY): Payer: Self-pay | Admitting: Interventional Radiology

## 2016-12-22 ENCOUNTER — Ambulatory Visit: Payer: Medicare Other | Admitting: Cardiology

## 2016-12-26 ENCOUNTER — Other Ambulatory Visit (HOSPITAL_COMMUNITY): Payer: Self-pay | Admitting: Interventional Radiology

## 2016-12-28 ENCOUNTER — Other Ambulatory Visit (HOSPITAL_COMMUNITY): Payer: Self-pay | Admitting: Interventional Radiology

## 2016-12-28 ENCOUNTER — Other Ambulatory Visit: Payer: Self-pay | Admitting: Cardiology

## 2016-12-28 DIAGNOSIS — M545 Low back pain: Secondary | ICD-10-CM

## 2016-12-28 DIAGNOSIS — M25559 Pain in unspecified hip: Secondary | ICD-10-CM

## 2016-12-28 NOTE — Telephone Encounter (Signed)
REFILL 

## 2017-01-01 ENCOUNTER — Other Ambulatory Visit: Payer: Self-pay | Admitting: Radiology

## 2017-01-01 NOTE — Progress Notes (Signed)
Patient ID: [name], female   DOB: 1932-09-10, 83 y.o.   MRN: [id number]   Scheduled for MRI 10/3 per Dr Corliss Skains order Scheduled for [facility]  Xanax 0.25 mg po 30 min to 1 hr before procedure Order in [employer]

## 2017-01-03 ENCOUNTER — Ambulatory Visit (HOSPITAL_COMMUNITY)
Admission: RE | Admit: 2017-01-03 | Discharge: 2017-01-03 | Disposition: A | Discharge status: Home / Self Care | Payer: Medicare Other | Source: Ambulatory Visit | Attending: Interventional Radiology | Admitting: Interventional Radiology

## 2017-01-03 ENCOUNTER — Telehealth: Payer: Self-pay

## 2017-01-03 DIAGNOSIS — M4856XA Collapsed vertebra, not elsewhere classified, lumbar region, initial encounter for fracture: Secondary | ICD-10-CM | POA: Insufficient documentation

## 2017-01-03 DIAGNOSIS — M545 Low back pain: Secondary | ICD-10-CM

## 2017-01-03 DIAGNOSIS — M5440 Lumbago with sciatica, unspecified side: Secondary | ICD-10-CM | POA: Diagnosis not present

## 2017-01-03 DIAGNOSIS — M47816 Spondylosis without myelopathy or radiculopathy, lumbar region: Secondary | ICD-10-CM | POA: Insufficient documentation

## 2017-01-03 DIAGNOSIS — M25559 Pain in unspecified hip: Secondary | ICD-10-CM

## 2017-01-03 DIAGNOSIS — M48061 Spinal stenosis, lumbar region without neurogenic claudication: Secondary | ICD-10-CM | POA: Diagnosis not present

## 2017-01-03 MED ORDER — LORAZEPAM 2 MG/ML IJ SOLN
1.0000 mg | Freq: Once | INTRAMUSCULAR | Status: AC
  Administered 2017-01-03: 1 mg via INTRAVENOUS

## 2017-01-03 MED ORDER — ALPRAZOLAM 0.25 MG PO TABS
0.2500 mg | ORAL_TABLET | Freq: Once | ORAL | Status: AC
  Administered 2017-01-03: 0.25 mg via ORAL

## 2017-01-03 MED ORDER — LORAZEPAM 2 MG/ML IJ SOLN
INTRAMUSCULAR | Status: AC
  Administered 2017-01-03: 1 mg via INTRAVENOUS
  Filled 2017-01-03: qty 1

## 2017-01-03 MED ORDER — ALPRAZOLAM 0.25 MG PO TABS
ORAL_TABLET | ORAL | Status: AC
  Administered 2017-01-03: 0.25 mg via ORAL
  Filled 2017-01-03: qty 1

## 2017-01-03 NOTE — Telephone Encounter (Signed)
Ok to do. Just to let us know which meds need refilled through 04/2017. Pharmacy may send [place] requests until then.

## 2017-01-03 NOTE — Telephone Encounter (Signed)
Pt left v/m; pt was dismissed from practice and has found a new dr at [facility] but pt cannot be seen until 04/30/17. Pt request cb that Dr Reece Agar will refill her meds until can see new doctor.

## 2017-01-04 NOTE — Telephone Encounter (Signed)
Spoke with pt, says she is not sure but she thinks just metoprolol and digoxin right now. But she is good on the others. Expresses her thanks.

## 2017-01-05 MED ORDER — METOPROLOL TARTRATE 25 MG PO TABS: 25.0000 mg | ORAL_TABLET | Freq: Two times a day (BID) | ORAL | 6 refills | Status: AC

## 2017-01-05 MED ORDER — DIGOXIN 125 MCG PO TABS: 125.0000 ug | ORAL_TABLET | Freq: Every day | ORAL | 6 refills | Status: AC

## 2017-01-05 NOTE — Telephone Encounter (Signed)
Metoprolol, digoxin refilled.

## 2017-01-08 DIAGNOSIS — M79641 Pain in right hand: Secondary | ICD-10-CM | POA: Diagnosis not present

## 2017-01-08 DIAGNOSIS — G894 Chronic pain syndrome: Secondary | ICD-10-CM | POA: Diagnosis not present

## 2017-01-08 DIAGNOSIS — S22009S Unspecified fracture of unspecified thoracic vertebra, sequela: Secondary | ICD-10-CM | POA: Diagnosis not present

## 2017-01-08 DIAGNOSIS — Z79891 Long term (current) use of opiate analgesic: Secondary | ICD-10-CM | POA: Diagnosis not present

## 2017-01-09 ENCOUNTER — Other Ambulatory Visit: Payer: Self-pay | Admitting: Family Medicine

## 2017-01-10 ENCOUNTER — Other Ambulatory Visit: Payer: Self-pay

## 2017-01-10 MED ORDER — ATORVASTATIN CALCIUM 40 MG PO TABS: 40.0000 mg | ORAL_TABLET | Freq: Every day | ORAL | 0 refills | Status: AC

## 2017-01-11 NOTE — Progress Notes (Deleted)
HPI The patient presents for followup of atrial fibrillation and syncope.  She has been on anticoagulation.  However, she refused to take her Eliquis.  She said she had increased falls on Coumadin and she refuses to take this. The risks have been discussed with her in detail in the past. She adamantly refuses anticoagulation.  Since I last saw her she was admitted with N/V.  She had hyokalemia.  ***  Since I last saw her she was in the hospital in the fall after having a fall. She injured her hand and had to get stitches and then this became infected. She now doesn't have the use of the middle finger on her right hand. I did go back and review those hospital records and there was no mention of syncope. It was a mechanical fall and specifically mentions that she tripped. She is very vague about this but there is not been any obvious syncope. She clearly has balance issues and dizziness. We had a long workup of this. She's supposed to be using a Preble but she refuses. She's not been having any new chest pressure, neck or arm discomfort. She's not been having any chest pressure, neck or arm discomfort. She does feel like her heart sometimes although she doesn't realize it always in atrial fibrillation.  Allergies  Allergen Reactions  . Coumadin [Warfarin Sodium] Other (See Comments)    Makes pass out  . Adhesive [Tape] Rash  . Ivp Dye [Iodinated Diagnostic Agents] Other (See Comments)    Rash and turns purple Rash and turns purple  . Vytorin [Ezetimibe-Simvastatin] Other (See Comments)    Other leg cramps  . Iodides Rash    Current Outpatient Prescriptions  Medication Sig Dispense Refill  . allopurinol (ZYLOPRIM) 100 MG tablet Take 100 mg by mouth daily.    Marland Kitchen aspirin EC 81 MG tablet Take 1 tablet (81 mg total) by mouth daily.    Marland Kitchen atorvastatin (LIPITOR) 40 MG tablet Take 1 tablet (40 mg total) by mouth daily. 30 tablet 0  . clotrimazole-betamethasone (LOTRISONE) cream Apply 1 application  topically 2 (two) times daily. Apply externally BID for 2 wks 15 g 0  . digoxin (LANOXIN) 0.125 MG tablet Take 1 tablet (125 mcg total) by mouth daily. 30 tablet 6  . FLUoxetine (PROZAC) 20 MG capsule Take 20 mg by mouth daily.    . furosemide (LASIX) 20 MG tablet Take 0.5 tablets (10 mg total) by mouth as needed.    Marland Kitchen lisinopril (PRINIVIL,ZESTRIL) 5 MG tablet TAKE ONE TABLET DAILY 90 tablet 0  . metoprolol tartrate (LOPRESSOR) 25 MG tablet Take 1 tablet (25 mg total) by mouth 2 (two) times daily. 60 tablet 6  . Multiple Vitamins-Minerals (ONE-A-DAY WOMENS 50+ ADVANTAGE) TABS Take 1 tablet by mouth daily.     . naproxen (NAPROSYN) 500 MG tablet Take 500 mg by mouth 2 (two) times daily with a meal.    . nitroGLYCERIN (NITROSTAT) 0.4 MG SL tablet DISSOLVE ONE TABLET UNDER THE TONGUE AS NEEDED FOR CHEST PAIN. MAY REPEAT AS INDICATED BY YOUR DOCTOR. 25 tablet 11  . nystatin cream (MYCOSTATIN) APPLY TOPICALLY TWICE A DAY 30 g 1  . oxyCODONE (OXY IR/ROXICODONE) 5 MG immediate release tablet Take 1 tablet (5 mg total) by mouth every 6 (six) hours as needed for severe pain. 12 tablet 0   No current facility-administered medications for this visit.     Past Medical History:  Diagnosis Date  . Arthritis   . Atherosclerosis of  abdominal aorta (HCC)    by xray  . Atrial fibrillation (HCC)   . CAD (coronary artery disease)    95% LAD stenosis, Cypher x 02 September 1996  . Cervicalgia   . CHF (congestive heart failure) (HCC) 2014   on recent hospitalization  . Chronic interstitial lung disease (HCC) 08/2015   by CXR  . Depression with anxiety    celexa started 04/2010  . Dyslipidemia   . Facial trauma 06/01/2014   Fall 05/2014 without fracture by facial/head CT   . Gout    prior PCP stopped allopurinol  . Hyperlipidemia   . Hypertension   . LBP (low back pain) 2005   spinal stenosis and bulging discs at L2-L5 s/p lumbar laminectomy Dutch Quint)  . Lymphocytic colitis 10/28/2013  . Osteopenia 07/2013   T  score -2.2 L femur  . Peripheral neuropathy since feb 2015   right arm   . Prediabetes 2001  . Syncopal episodes 2015   orthostatic, normal EEG and neuro eval ([facility])  . T12 compression fracture (HCC) 2015   by CT    Past Surgical History:  Procedure Laterality Date  . CARDIAC CATHETERIZATION    . CATARACT EXTRACTION Bilateral   . COLONOSCOPY WITH PROPOFOL N/A 10/16/2013   Rachael Fee, MD  . DEXA  07/2013   T score -2.2 L femur  . INCISION AND DRAINAGE Right 02/06/2016   Procedure: INCISION AND DRAINAGE right middle finger;  Surgeon: Juanell Fairly, MD;  Location: [facility] ORS;  Service: Orthopedics;  Laterality: Right;  . IR RADIOLOGIST EVAL & MGMT  12/19/2016  . IR VERTEBROPLASTY LUMBAR BX INC UNI/BIL INC/INJECT/IMAGING  11/17/2016  . LUMBAR LAMINECTOMY  2005   Dr. Dutch Quint  . PERCUTANEOUS CORONARY STENT INTERVENTION (PCI-S)     stent x3 in heart  . TONSILLECTOMY    . TUBAL LIGATION      ROS:  ***  PHYSICAL EXAM There were no vitals taken for this visit.  GENERAL:  Well appearing NECK:  No jugular venous distention, waveform within normal limits, carotid upstroke brisk and symmetric, no bruits, no thyromegaly LUNGS:  Clear to auscultation bilaterally CHEST:  Unremarkable HEART:  PMI not displaced or sustained,S1 and S2 within normal limits, no S3, no S4, no clicks, no rubs, *** murmurs ABD:  Flat, positive bowel sounds normal in frequency in pitch, no bruits, no rebound, no guarding, no midline pulsatile mass, no hepatomegaly, no splenomegaly EXT:  2 plus pulses throughout, no edema, no cyanosis no clubbing\   GENERAL:  Well appearing NECK:  No jugular venous distention, waveform within normal limits, carotid upstroke brisk and symmetric, no bruits, no thyromegaly LUNGS:  Clear to auscultation bilaterally BACK:  No CVA tenderness but tender to palpation over the left chest at the site of her rash. HEART:  PMI not displaced or sustained,S1 and S2 within normal limits, no S3,  no clicks, no rubs, no murmurs, irregular ABD:  Flat, positive bowel sounds normal in frequency in pitch, no bruits, no rebound, no guarding, no midline pulsatile mass, no hepatomegaly, no splenomegaly EXT:  2 plus pulses throughout, trace edema, no cyanosis no clubbing, right ring finger scar NEURO:  Non focal  EKG:  Atrial fibrillation, rate ***, axis within normal limits, intervals within normal limits, nonspecific diffuse ST changes.  No change from previous. 01/11/2017  ASSESSMENT AND PLAN  DIASTOLIC HF:   ***    CAD -  ***  She had a negative stress perfusion study in 2013  No  testing is indicated at this point.     ESSENTIAL HYPERTENSION, BENIGN -  The blood pressure is at target. ***  No change in medications is indicated. We will continue with therapeutic lifestyle changes (TLC).    ATRIAL FIBRILLATION -  Ms. TONYETTA BERKO has a CHA2DS2 - VASc score of 5 with a risk of stroke of 6.7%   . She refuses to take anticoagulation.  *** This might not be unreasonable as she seems to be high risk for falls.  SYNCOPE - ***  I looked through the hospital notes carefully I cannot document recurrent syncope. Aside from that one episode there wasn't any suggestion of syncope and it looks like the episode where she injured her finger was a fall. I have cautioned her to use a Galas and she says she's she will agree.

## 2017-01-12 ENCOUNTER — Ambulatory Visit: Payer: Medicare Other | Admitting: Cardiology

## 2017-01-14 ENCOUNTER — Emergency Department
Admission: EM | Admit: 2017-01-14 | Discharge: 2017-02-01 | Disposition: E | Payer: Medicare Other | Attending: Emergency Medicine | Admitting: Emergency Medicine

## 2017-01-14 DIAGNOSIS — I714 Abdominal aortic aneurysm, without rupture: Secondary | ICD-10-CM | POA: Diagnosis not present

## 2017-01-14 DIAGNOSIS — I469 Cardiac arrest, cause unspecified: Secondary | ICD-10-CM | POA: Insufficient documentation

## 2017-01-14 DIAGNOSIS — I2510 Atherosclerotic heart disease of native coronary artery without angina pectoris: Secondary | ICD-10-CM | POA: Insufficient documentation

## 2017-01-14 MED ORDER — EPINEPHRINE PF 1 MG/ML IJ SOLN
1.0000 mg | Freq: Once | INTRAMUSCULAR | Status: AC
  Administered 2017-01-14: 1 mg via INTRAVENOUS

## 2017-01-14 MED FILL — Medication: Qty: 1 | Status: AC

## 2017-01-15 ENCOUNTER — Telehealth: Payer: Self-pay

## 2017-01-15 ENCOUNTER — Ambulatory Visit: Payer: Medicare Other | Admitting: Cardiology

## 2017-01-15 NOTE — Telephone Encounter (Signed)
Client [facility] Night - Client Client Site [facility] - Night Physician Eustaquio Boyden - MD Contact Type Call Who Is Calling Physician / Provider / Hospital Call Type Provider Call [facility] Page Now Reason for Call Request to speak to Physician Initial Comment Caller states [place] from [facility] ER at [phone] , Pt has Passed Additional Comment Patient Name [name] Patient DOB 02-05-1933 Requesting Provider [facility] Physician Number [phone] Facility Name [facility] ER Paging DoctorName Phone DateTime Result/Outcome Message Type Notes Danise Edge - MD [phone] 2017/01/17 5:37:02 PM Called On Call Provider - Left Message Doctor Paged Call Sch[facility] at [facility] [phone] Danise Edge - MD 01/17/2017 5:40:44 PM Spoke with On Call - General Message Result Call Closed By: Ishmael Holter Transaction Date/Time: 01-17-17 5:20:48 PM (ET)

## 2017-01-15 NOTE — Telephone Encounter (Signed)
Reviewed record from ER last pm.  Appears patient brought in in cardiac arrest and pronounced deceased from the ER.

## 2017-01-15 NOTE — Telephone Encounter (Signed)
Client [facility] Night - Client Client Site [facility] - Night Physician Eustaquio Boyden - MD Contact Type Call Who Is Calling Physician / Provider / Hospital Call Type Provider Call [facility] Page Now Reason for Call Request to speak to Physician Initial Comment Caller with the [facility] is wanting to get the on-call to call him back. Pt is in the ER they have been trying to reach him for 2 hrs. The patient died and they are needing to speak with him about the patient before they can release the body. Additional Comment Patient Name [name] Patient DOB 10-23-1932 Requesting Provider Daryel November Physician Number [phone] Facility Name [facility] Paging DoctorName Phone DateTime Result/Outcome Message Type Notes Danise Edge - MD [phone] 30-Jan-2017 6:53:33 PM Called On Call Provider - Left Message Doctor Paged Please call [facility] at [phone]. Danise Edge - MD 01-30-17 7:02:34 PM Spoke with On Call - General Message Result Dr. Abner Greenspan called back stating that she has already spoken to Dr. Mayford Knife at the hospital, but agreed to speak to them again.

## 2017-01-16 NOTE — Telephone Encounter (Signed)
plz call son - has death certificate been taken care of? I have not received one yet. Funeral home usually brings them to our office.

## 2017-01-17 NOTE — Telephone Encounter (Signed)
Spoke with pt's son, [name] (on dpr) asking about the certificate. Says the funeral home is still processing it and someone should be bringing by and wait for it to be signed.

## 2017-02-01 NOTE — ED Provider Notes (Signed)
[facility]  Department of Emergency Medicine   Code [facility] Note  Chief Complaint: Cardiac arrest/unresponsive   Level V Caveat: Unresponsive  History of present illness: patient arrives via EMS status post chronic arrest. Patient had been intubated with a King airway and had received at least 6 mg of epinephrine with a normal fingerstick blood sugar. She had transient return of spontaneous circulation arrives without a pulse.  ROS: Unable to obtain, Level V caveat  Scheduled Meds: Continuous Infusions: PRN Meds:. Past Medical History:  Diagnosis Date  . Arthritis   . Atherosclerosis of abdominal aorta (HCC)    by xray  . Atrial fibrillation (HCC)   . CAD (coronary artery disease)    95% LAD stenosis, Cypher x 02 September 1996  . Cervicalgia   . CHF (congestive heart failure) (HCC) 2014   on recent hospitalization  . Chronic interstitial lung disease (HCC) 08/2015   by CXR  . Depression with anxiety    celexa started 04/2010  . Dyslipidemia   . Facial trauma 06/01/2014   Fall 05/2014 without fracture by facial/head CT   . Gout    prior PCP stopped allopurinol  . Hyperlipidemia   . Hypertension   . LBP (low back pain) 2005   spinal stenosis and bulging discs at L2-L5 s/p lumbar laminectomy Dutch Quint)  . Lymphocytic colitis 10/28/2013  . Osteopenia 07/2013   T score -2.2 L femur  . Peripheral neuropathy since feb 2015   right arm   . Prediabetes 2001  . Syncopal episodes 2015   orthostatic, normal EEG and neuro eval ([facility])  . T12 compression fracture (HCC) 2015   by CT   Past Surgical History:  Procedure Laterality Date  . CARDIAC CATHETERIZATION    . CATARACT EXTRACTION Bilateral   . COLONOSCOPY WITH PROPOFOL N/A 10/16/2013   Rachael Fee, MD  . DEXA  07/2013   T score -2.2 L femur  . INCISION AND DRAINAGE Right 02/06/2016   Procedure: INCISION AND DRAINAGE right middle finger;  Surgeon: Juanell Fairly, MD;  Location: [facility] ORS;  Service: Orthopedics;   Laterality: Right;  . IR RADIOLOGIST EVAL & MGMT  12/19/2016  . IR VERTEBROPLASTY LUMBAR BX INC UNI/BIL INC/INJECT/IMAGING  11/17/2016  . LUMBAR LAMINECTOMY  2005   Dr. Dutch Quint  . PERCUTANEOUS CORONARY STENT INTERVENTION (PCI-S)     stent x3 in heart  . TONSILLECTOMY    . TUBAL LIGATION     Social History   Social History  . Marital status: Widowed    Spouse name: N/A  . Number of children: 1  . Years of education: N/A   Occupational History  .  Retired   Social History Main Topics  . Smoking status: Never Smoker  . Smokeless tobacco: Never Used  . Alcohol use Yes     Comment: Occasionally drinks beer  . Drug use: No  . Sexual activity: Not on file   Other Topics Concern  . 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 Coumadin [Warfarin Sodium] Other (See Comments)    Makes pass out  . Adhesive [Tape] Rash  . Ivp Dye [Iodinated Diagnostic Agents] Other (See Comments)    Rash and turns purple Rash and turns purple  . Vytorin [Ezetimibe-Simvastatin] Other (See Comments)  Other leg cramps  . Iodides Rash    Last set of Vital Signs (not current) There were no vitals filed for this visit.    Physical Exam  Gen: unresponsive Cardiovascular: pulseless  Resp: apneic. Breath sounds equal bilaterally with bagging  Abd: nondistended  Neuro: GCS 3, unresponsive to pain  HEENT: No blood in posterior pharynx, gag reflex absent  Neck: No crepitus  Musculoskeletal: No deformity  Skin: warm  CRITICAL CARE Performed by: Emily Filbert Total critical care time: 15 Critical care time was exclusive of separately billable procedures and treating other patients. Critical care was necessary to treat or prevent imminent or life-threatening deterioration. Critical care was time spent personally by  me on the following activities: development of treatment plan with patient and/or surrogate as well as nursing, discussions with consultants, evaluation of patient's response to treatment, examination of patient, obtaining history from patient or surrogate, ordering and performing treatments and interventions, ordering and review of laboratory studies, ordering and review of radiographic studies, pulse oximetry and re-evaluation of patient's condition.  Cardiopulmonary Resuscitation (CPR) Procedure Note Directed/Performed by: Emily Filbert I personally directed ancillary staff and/or performed CPR in an effort to regain return of spontaneous circulation and to maintain cardiac, neuro and systemic perfusion.    Medical Decision making  patient arrives status post arrest and it was an unwitnessed arrest. There was up to 30 minutes of downtime prior to the son finding her. EMS initiated CPR there was no CPR prior to EMS arrival. She was intubated appropriately and circulation was initiated with the [place] device.   Assessment and Plan  cardiopulmonary arrest  We gave additional dose of epinephrine as well as continued chest compressions and ventilation with only transient return of circulation. There was no identifiable organized cardiac activity on ultrasound. I discussed with the family and time of death was called.   Emily Filbert, MD January 31, 2017 [phone]

## 2017-02-01 NOTE — ED Notes (Addendum)
Pts son found pt unresponsive.  CPR was in progress upon arrival.  Pt was given 5 Epi, 1 Narcan.  Pts extremities are pale and the Samuel Bouche is in progress.  EMS states they were able to get the pt back about 5 times in route but she would arrest again shortly.  Upon arrival EMS states they have been working on pt for 1 hour.

## 2017-02-01 NOTE — Progress Notes (Signed)
CH responded to a PG for a CPR in progress. Pt arrived by EMS and was being attended to. CH located the son and a friend and escorted them to the family waiting area. CH informed EDP of the families presence and escorted him to the family. EDP informed the family that it was not looking good. Son stated that his mother would not want life prolonging measures. EDP returned a few minutes later to inform that the Pt had passed (4:53 PM) Son declined a viewing. CH provided a pastoral presence. Seeing that they were in a good space, I expressed my sympathy and excused myself.    02/09/2017 1700  Clinical Encounter Type  Visited With Family;Health care provider  Visit Type Initial;Spiritual support;Death;ED  Referral From Nurse  Spiritual Encounters  Spiritual Needs Prayer;Emotional

## 2017-02-01 NOTE — ED Notes (Addendum)
Pulse check.  Weak Femoral Pulse felt.

## 2017-02-01 NOTE — ED Notes (Signed)
Ultrasound pulse check.  No pulse.

## 2017-02-01 NOTE — ED Triage Notes (Signed)
CPR in progress. 

## 2017-02-01 NOTE — Progress Notes (Signed)
01/15/2017- 0740- No notation of Eustaquio Boyden MD  being notified to sign death certificate.When accessing medical record to find deceased MD's phone # it was noted that patient had been discharged from [facility] practice Aug. 30,2018. Lavell Anchors ME covering on 01/16/2017 was notified. He contacted [facility] and found that the deceased had Oxycodone 10 mg filled on  01/08/2017. #90 tabs and 26 were left. Lavell Anchors ME notified [facility]. Has become an ME case. I called [facility] for [place] to see who had prescribed the medication.Thyra Breed MD [facility]) Pharmacy made aware that patient was deceased. 1255- [facility] called to let me know that the son had come to the pharmacy to pick up  any medications that were available that had been  filled for his Mother. They made him aware that they knew she was deceased.K.[place] ME made aware of this. He notified [employer]. 41- deceased son called to see why his Mother had not been released to [facility]? I made him aware that this was a Medical Examiner case and his Mother would be going to [place] to the [facility] tomorrow.(Bottle of Oxycodone 10 mg  ordered 1 every eight hours #90 tabs has 26 left in bottle with body in morgue.)

## 2017-02-01 NOTE — Progress Notes (Deleted)
HPI The patient presents for followup of atrial fibrillation and syncope.  She has been on anticoagulation.  However, she refused to take her Eliquis.  She said she had increased falls on Coumadin and she refuses to take this. The risks have been discussed with her in detail in the past. She adamantly refuses anticoagulation.  Since I last saw her she was admitted with N/V.  She had hyokalemia.  ***  Since I last saw her she was in the hospital in the fall after having a fall. She injured her hand and had to get stitches and then this became infected. She now doesn't have the use of the middle finger on her right hand. I did go back and review those hospital records and there was no mention of syncope. It was a mechanical fall and specifically mentions that she tripped. She is very vague about this but there is not been any obvious syncope. She clearly has balance issues and dizziness. We had a long workup of this. She's supposed to be using a Darwin but she refuses. She's not been having any new chest pressure, neck or arm discomfort. She's not been having any chest pressure, neck or arm discomfort. She does feel like her heart sometimes although she doesn't realize it always in atrial fibrillation.  Allergies  Allergen Reactions  . Coumadin [Warfarin Sodium] Other (See Comments)    Makes pass out  . Adhesive [Tape] Rash  . Ivp Dye [Iodinated Diagnostic Agents] Other (See Comments)    Rash and turns purple Rash and turns purple  . Vytorin [Ezetimibe-Simvastatin] Other (See Comments)    Other leg cramps  . Iodides Rash    Current Outpatient Prescriptions  Medication Sig Dispense Refill  . allopurinol (ZYLOPRIM) 100 MG tablet Take 100 mg by mouth daily.    Marland Kitchen aspirin EC 81 MG tablet Take 1 tablet (81 mg total) by mouth daily.    Marland Kitchen atorvastatin (LIPITOR) 40 MG tablet Take 1 tablet (40 mg total) by mouth daily. 30 tablet 0  . clotrimazole-betamethasone (LOTRISONE) cream Apply 1 application  topically 2 (two) times daily. Apply externally BID for 2 wks 15 g 0  . digoxin (LANOXIN) 0.125 MG tablet Take 1 tablet (125 mcg total) by mouth daily. 30 tablet 6  . FLUoxetine (PROZAC) 20 MG capsule Take 20 mg by mouth daily.    . furosemide (LASIX) 20 MG tablet Take 0.5 tablets (10 mg total) by mouth as needed.    Marland Kitchen lisinopril (PRINIVIL,ZESTRIL) 5 MG tablet TAKE ONE TABLET DAILY 90 tablet 0  . metoprolol tartrate (LOPRESSOR) 25 MG tablet Take 1 tablet (25 mg total) by mouth 2 (two) times daily. 60 tablet 6  . Multiple Vitamins-Minerals (ONE-A-DAY WOMENS 50+ ADVANTAGE) TABS Take 1 tablet by mouth daily.     . naproxen (NAPROSYN) 500 MG tablet Take 500 mg by mouth 2 (two) times daily with a meal.    . nitroGLYCERIN (NITROSTAT) 0.4 MG SL tablet DISSOLVE ONE TABLET UNDER THE TONGUE AS NEEDED FOR CHEST PAIN. MAY REPEAT AS INDICATED BY YOUR DOCTOR. 25 tablet 11  . nystatin cream (MYCOSTATIN) APPLY TOPICALLY TWICE A DAY 30 g 1  . oxyCODONE (OXY IR/ROXICODONE) 5 MG immediate release tablet Take 1 tablet (5 mg total) by mouth every 6 (six) hours as needed for severe pain. 12 tablet 0   No current facility-administered medications for this visit.     Past Medical History:  Diagnosis Date  . Arthritis   . Atherosclerosis of  abdominal aorta (HCC)    by xray  . Atrial fibrillation (HCC)   . CAD (coronary artery disease)    95% LAD stenosis, [place] x 02 September 1996  . Cervicalgia   . CHF (congestive heart failure) (HCC) 2014   on recent hospitalization  . Chronic interstitial lung disease (HCC) 08/2015   by CXR  . Depression with anxiety    celexa started 04/2010  . Dyslipidemia   . Facial trauma 06/01/2014   Fall 05/2014 without fracture by facial/head CT   . Gout    prior PCP stopped allopurinol  . Hyperlipidemia   . Hypertension   . LBP (low back pain) 2005   spinal stenosis and bulging discs at L2-L5 s/p lumbar laminectomy [facility])  . Lymphocytic colitis 10/28/2013  . Osteopenia 07/2013   T  score -2.2 L femur  . Peripheral neuropathy since feb 2015   right arm   . Prediabetes 2001  . Syncopal episodes 2015   orthostatic, normal EEG and neuro eval ([facility])  . T12 compression fracture (HCC) 2015   by CT    Past Surgical History:  Procedure Laterality Date  . CARDIAC CATHETERIZATION    . CATARACT EXTRACTION Bilateral   . COLONOSCOPY WITH PROPOFOL N/A 10/16/2013   Rachael Fee, MD  . DEXA  07/2013   T score -2.2 L femur  . INCISION AND DRAINAGE Right 02/06/2016   Procedure: INCISION AND DRAINAGE right middle finger;  Surgeon: Juanell Fairly, MD;  Location: [facility] OR[facility];  Service: Orthopedics;  Laterality: Right;  . IR RADIOLOGIST EVAL & MGMT  12/19/2016  . IR VERTEBROPLASTY LUMBAR BX INC UNI/BIL INC/INJECT/IMAGING  11/17/2016  . LUMBAR LAMINECTOMY  2005   Dr. [facility] Quint  . PERCUTANEOUS CORONARY STENT INTERVENTION (PCI-S)     stent x3 in heart  . TONSILLECTOMY    . TUBAL LIGATION      ROS:  ***  PHYSICAL EXAM There were no vitals taken for this visit.  GENERAL:  Well appearing NECK:  No jugular venous distention, waveform within normal limits, carotid upstroke brisk and symmetric, no bruits, no thyromegaly LUNGS:  Clear to auscultation bilaterally CHEST:  Unremarkable HEART:  PMI not displaced or sustained,S1 and S2 within normal limits, no S3, no S4, no clicks, no rubs, *** murmurs ABD:  Flat, positive bowel sounds normal in frequency in pitch, no bruits, no rebound, no guarding, no midline pulsatile mass, no hepatomegaly, no splenomegaly EXT:  2 plus pulses throughout, no edema, no cyanosis no clubbing\   GENERAL:  Well appearing NECK:  No jugular venous distention, waveform within normal limits, carotid upstroke brisk and symmetric, no bruits, no thyromegaly LUNGS:  Clear to auscultation bilaterally BACK:  No CVA tenderness but tender to palpation over the left chest at the site of her rash. HEART:  PMI not displaced or sustained,S1 and S2 within normal limits, no S3,  no clicks, no rubs, no murmurs, irregular ABD:  Flat, positive bowel sounds normal in frequency in pitch, no bruits, no rebound, no guarding, no midline pulsatile mass, no hepatomegaly, no splenomegaly EXT:  2 plus pulses throughout, trace edema, no cyanosis no clubbing, right ring finger scar NEURO:  Non focal  EKG:  Atrial fibrillation, rate ***, axis within normal limits, intervals within normal limits, nonspecific diffuse ST changes.  No change from previous. January 26, 2017  ASSESSMENT AND PLAN  DIASTOLIC HF:   ***    CAD -  ***  She had a negative stress perfusion study in 2013  No  testing is indicated at this point.     ESSENTIAL HYPERTENSION, BENIGN -  The blood pressure is at target. ***  No change in medications is indicated. We will continue with therapeutic lifestyle changes (TLC).    ATRIAL FIBRILLATION -  Ms. KATIRIA CALAME has a CHA2DS2 - VASc score of 5 with a risk of stroke of 6.7%   . She refuses to take anticoagulation.  *** This might not be unreasonable as she seems to be high risk for falls.  SYNCOPE - ***  I looked through the hospital notes carefully I cannot document recurrent syncope. Aside from that one episode there wasn't any suggestion of syncope and it looks like the episode where she injured her finger was a fall. I have cautioned her to use a Reasoner and she says she's she will agree.

## 2017-02-01 NOTE — ED Notes (Addendum)
Times of death 13

## 2017-02-01 DEATH — deceased

## 2018-02-02 IMAGING — DX DG ABDOMEN 1V
2 series · 2 of 2 positions shown · non-contrast
Comparison: None.

CLINICAL DATA: Abdominal pain

EXAM:
ABDOMEN - 1 VIEW

[abdomen kub (1 of 2)]
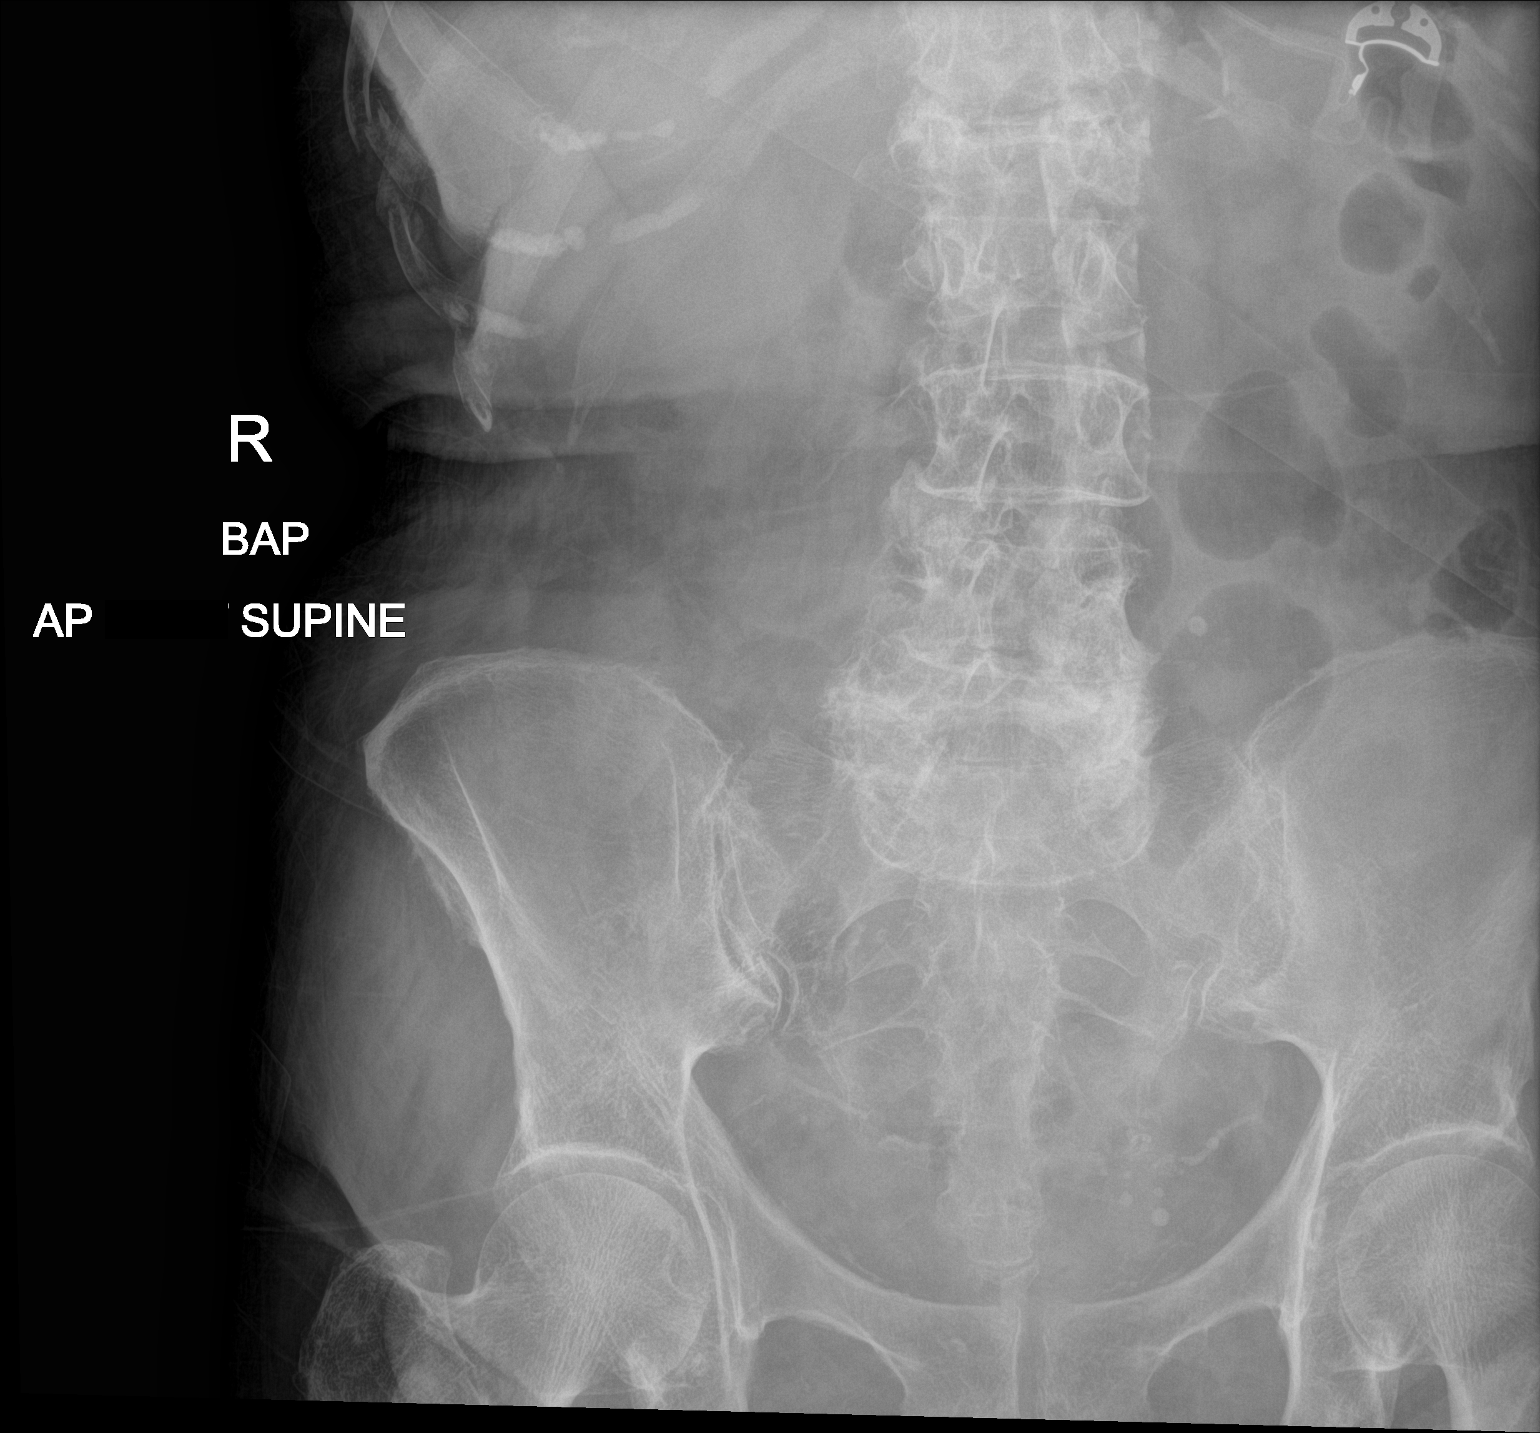

[abdomen kub (2 of 2)]
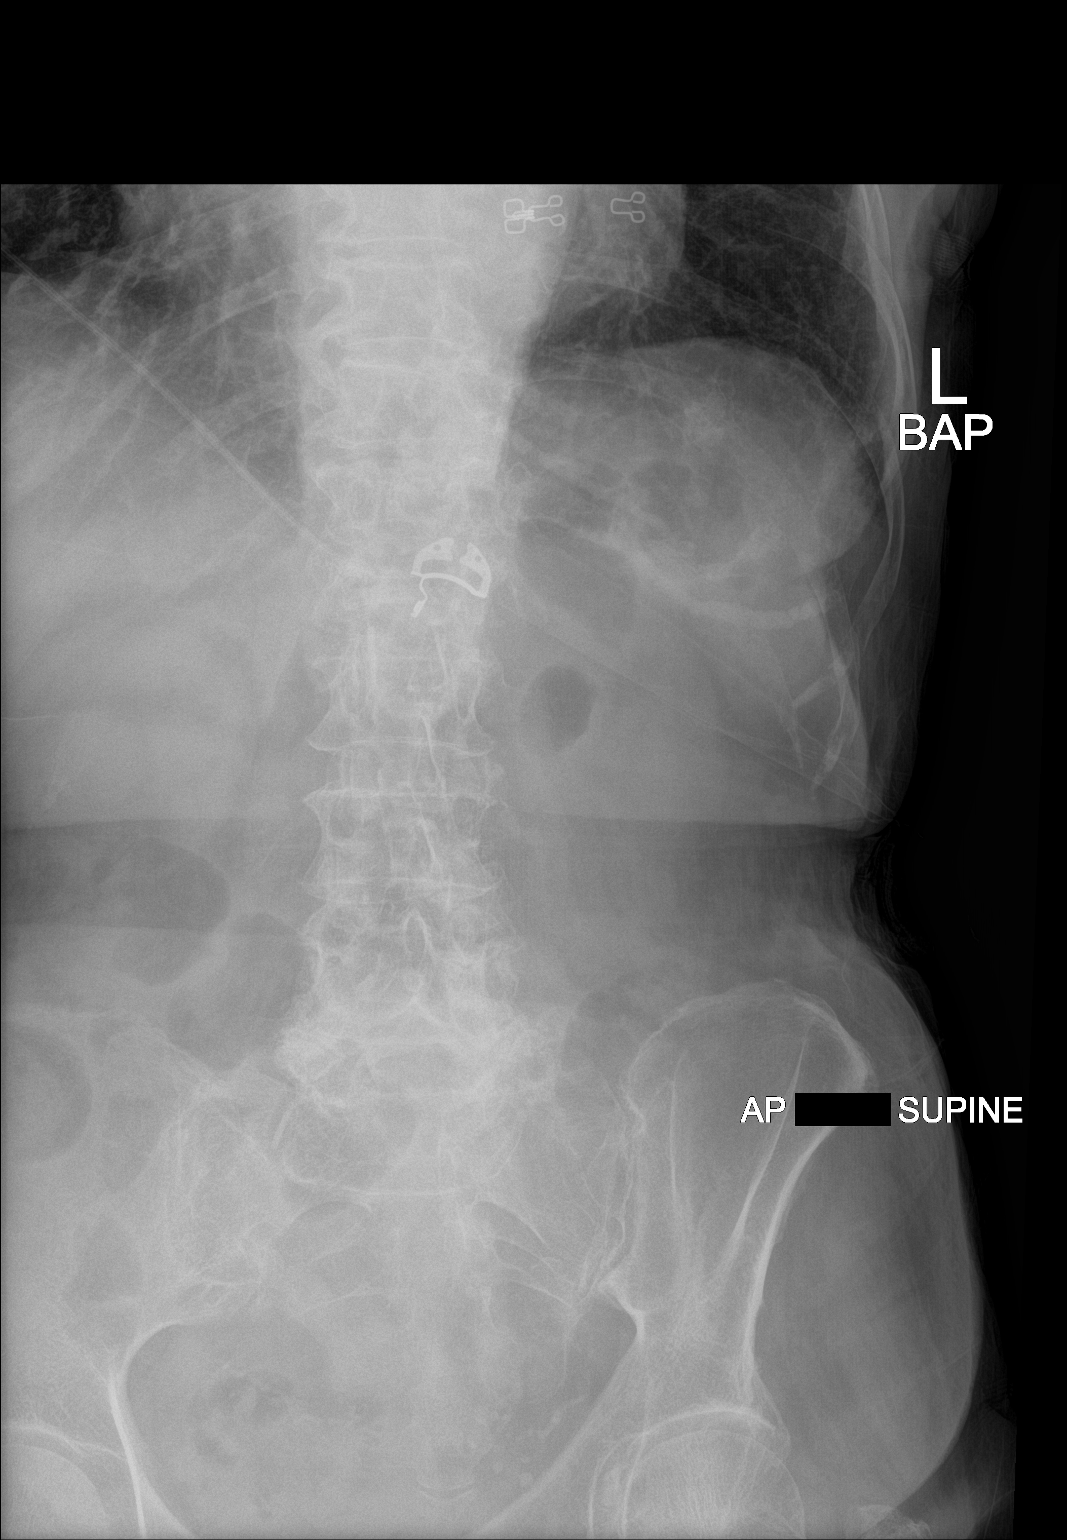

[2 of 2 positions shown; findings below may reference images not displayed]

FINDINGS: The bowel gas pattern is normal. No radio-opaque calculi or other
significant radiographic abnormality are seen.
IMPRESSION: Negative.

## 2018-02-03 IMAGING — MR MR LUMBAR SPINE W/O CM
2 series · 19 of 30 positions shown · non-contrast
Comparison: Lumbar spine radiograph 11/15/2016, 10/24/2016

Lumbar spine MRI 03/23/2011

CLINICAL DATA: Back pain

EXAM:
MRI LUMBAR SPINE WITHOUT CONTRAST
TECHNIQUE: Multiplanar, multisequence MR imaging of the lumbar spine was
performed. No intravenous contrast was administered.

[Series 4: T2 · sagittal · 4.0mm · 0.49mm/px · 10 of 15 slices shown]
[im 1/15]
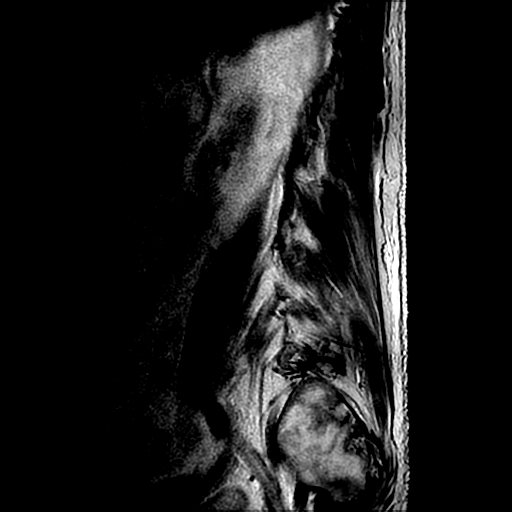
[im 2/15]
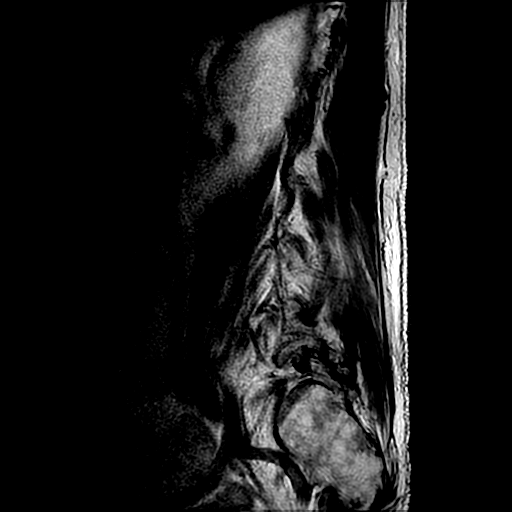
[im 3/15]
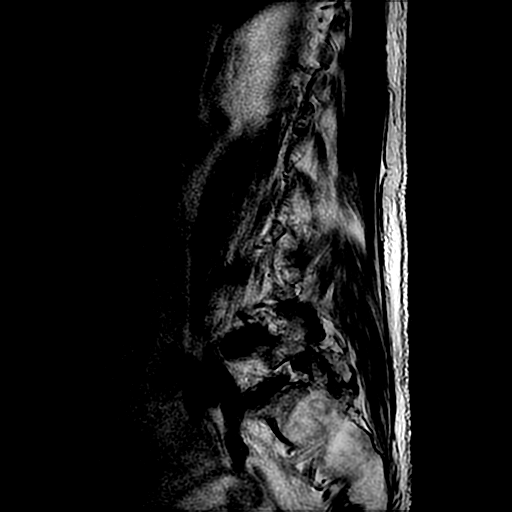
[im 5/15]
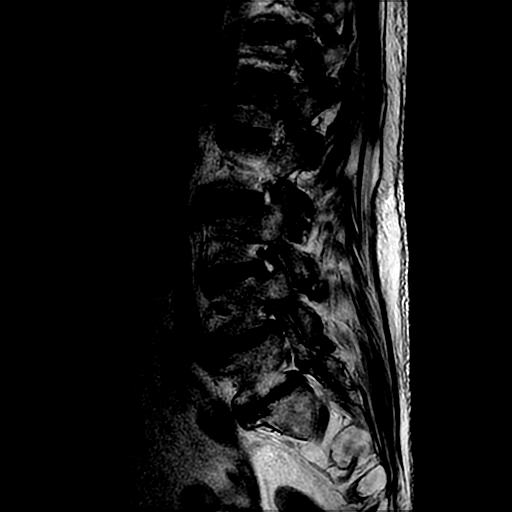
[im 7/15]
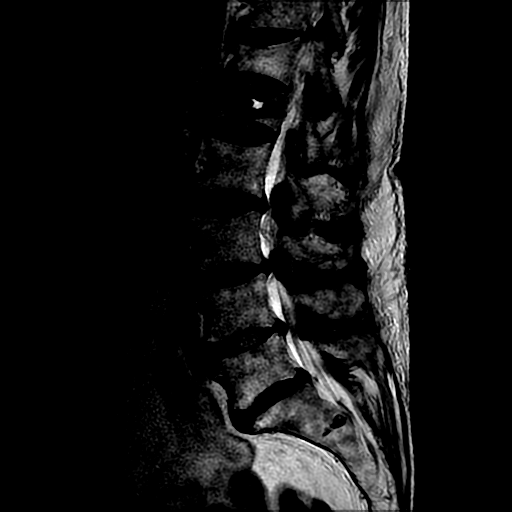
[im 8/15]
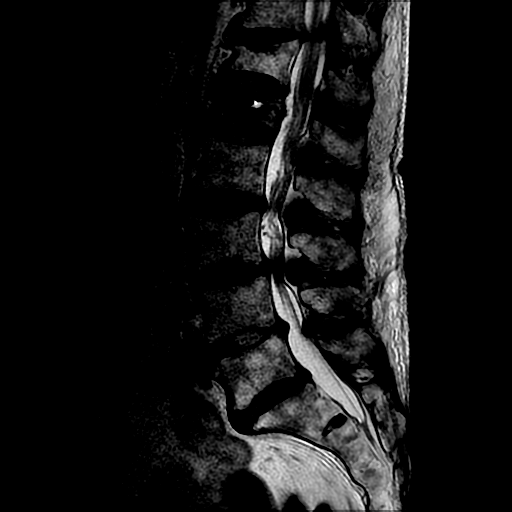
[im 9/15]
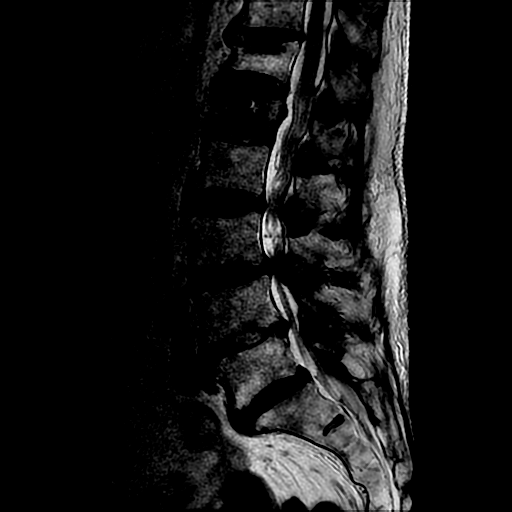
[im 11/15]
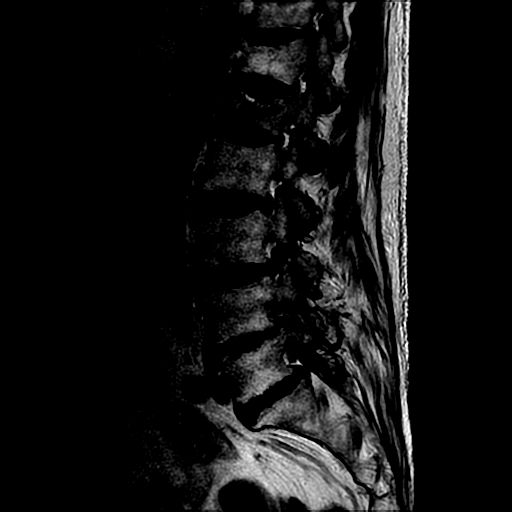
[im 13/15]
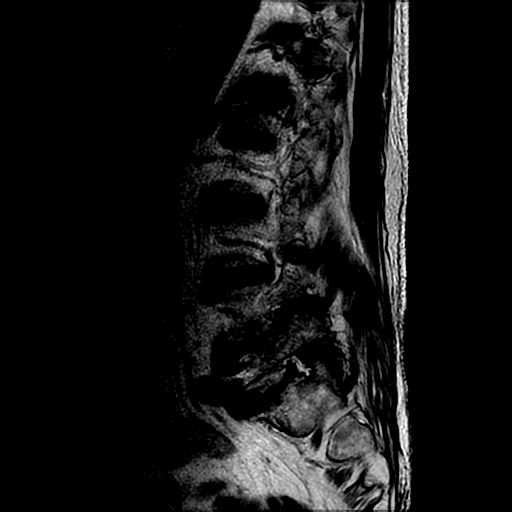
[im 15/15]
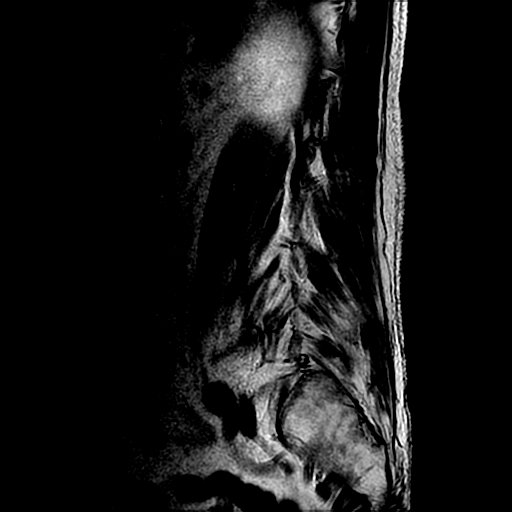

[Series 5: T1 · sagittal · 4.0mm · 0.49mm/px · 9 of 15 slices shown]
[im 1/15]
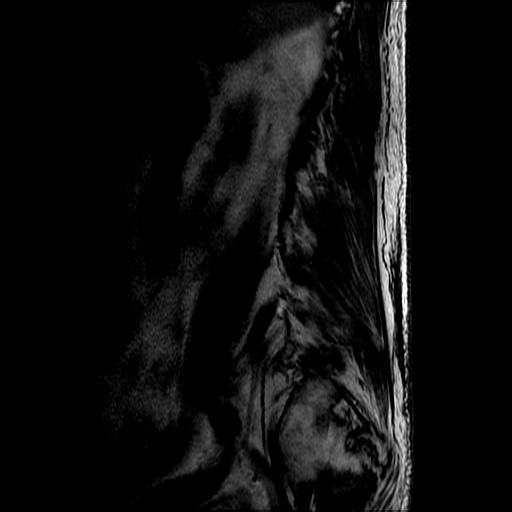
[im 3/15]
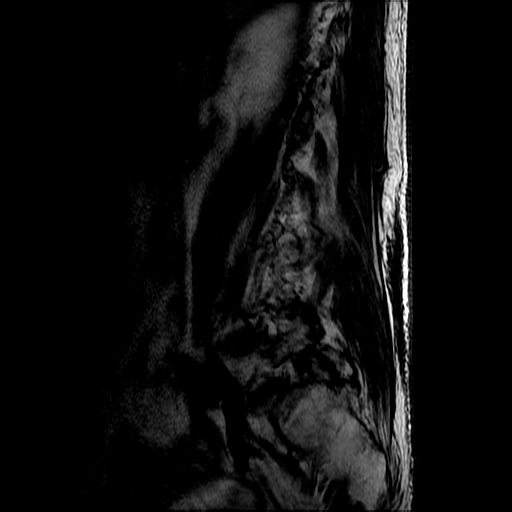
[im 5/15]
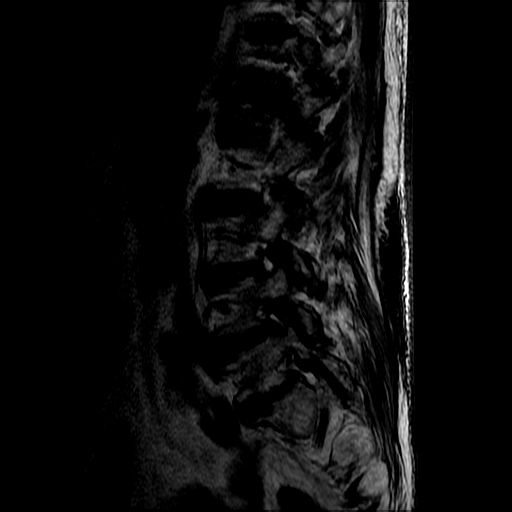
[im 7/15]
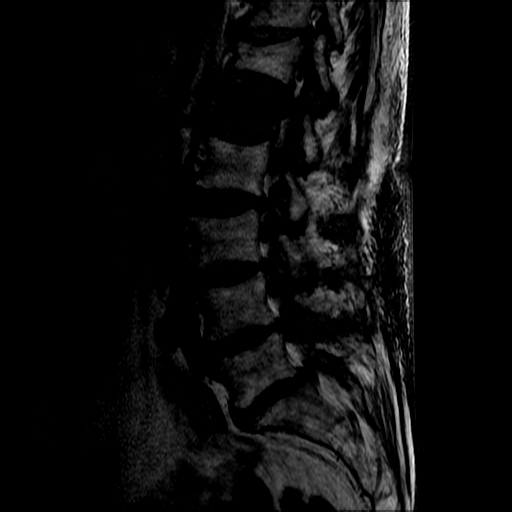
[im 8/15]
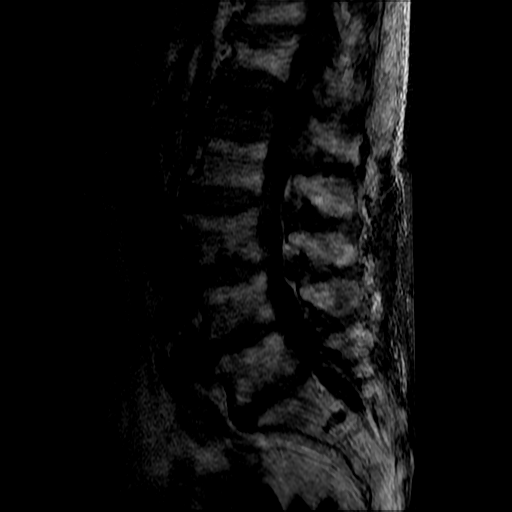
[im 9/15]
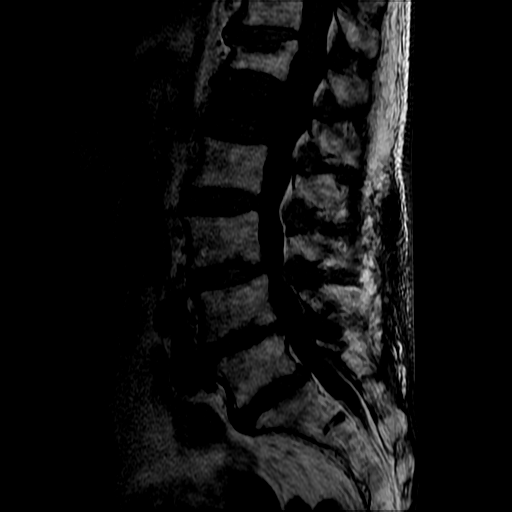
[im 11/15]
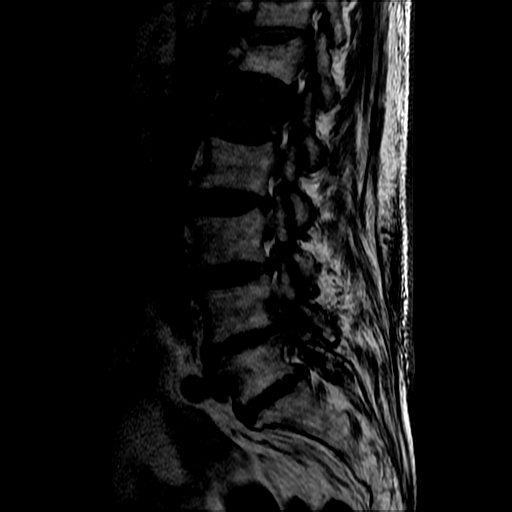
[im 13/15]
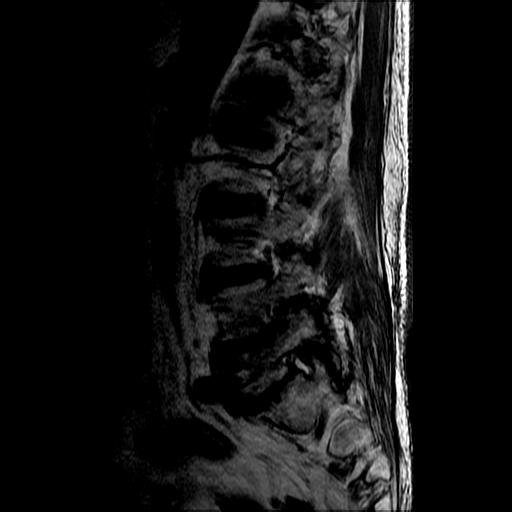
[im 15/15]
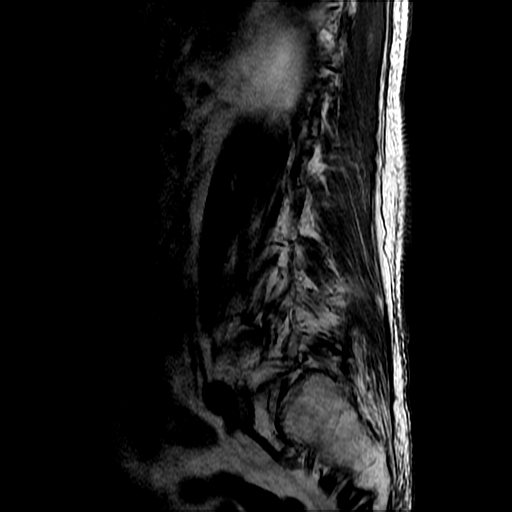

[19 of 30 positions shown; findings below may reference images not displayed]

FINDINGS: The examination had to be discontinued prior to completion due to
patient pain and inability CT tolerate the full study. Only T1 and
T2 weighted sagittal sequences were acquired.

There is a compression fracture of the L1 vertebral body with
approximately 60% height loss centrally. There is a superior
endplate compression fracture at T12 with approximately 20% house.
The L1 fracture is new compared to the radiograph 10/24/2016, but
unchanged compared to the radiograph from 11/15/2016. There is no
significant retropulsion.

There are small disc bulges at L2-L3, L3-L4, L4-L5 and L5-S1. At
L3-L4, this results in at least moderate spinal canal stenosis.
There is severe left neural foraminal stenosis at L4-L5,
predominantly caused by severe facet hypertrophy. There is moderate
neural foraminal stenosis at right L1-L2, left L3-L4 and left L5-S1.
IMPRESSION: 1. Truncated examination due to patient pain. Only 2 sagittal
sequences were acquired. No axial imaging was acquired.
2. Compression fracture of the L1 vertebral body with approximately
60% height loss. This does not result in significant retropulsion
and does not cause spinal canal stenosis. Superior endplate fracture
of T12 with approximately 20% height loss.
3. At least moderate spinal canal stenosis at L3-L4 secondary to
disc bulge. This is unchanged from the MRI of 03/31/2011
[DATE]. Severe left neural foraminal stenosis at L4-L5, mostly caused by
facet arthrosis. This is unchanged compared to 03/23/2011
5. Multilevel neural foraminal stenosis that is moderate, including
right L1-2, left L3-4 and left L5-S1.

## 2018-03-23 IMAGING — MR MR SACRUM / SI JOINTS WO CM
4 of 6 series · 16 of 48 positions shown · non-contrast
Comparison: None.

CLINICAL DATA: Initial evaluation for increasing back pain with
radiation into left hip. Multiple falls recently. History of prior
vertebroplasty.

EXAM:
MRI LUMBAR SPINE WITHOUT CONTRAST
TECHNIQUE: Multiplanar, multisequence MR imaging of the lumbar spine was
performed. No intravenous contrast was administered.

[Series 3: T1 · coronal · 4.0mm · 0.47mm/px · 6 of 17 slices shown (1 of 2)]
[im 1/17]
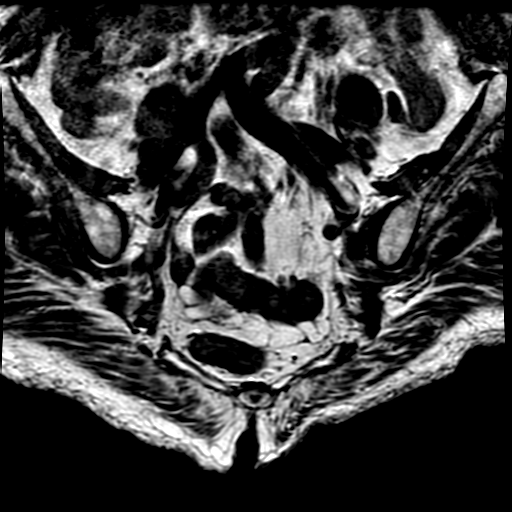
[im 4/17]
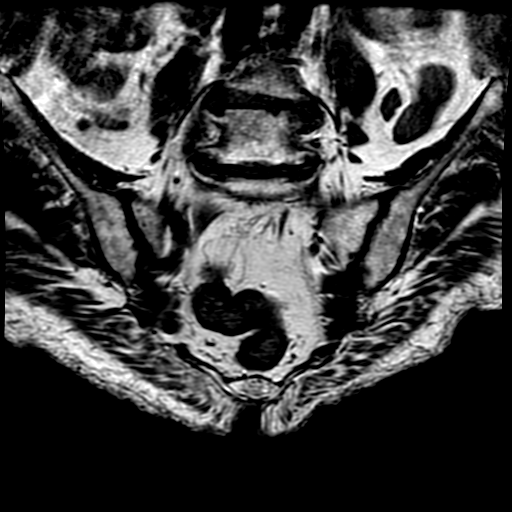
[im 7/17]
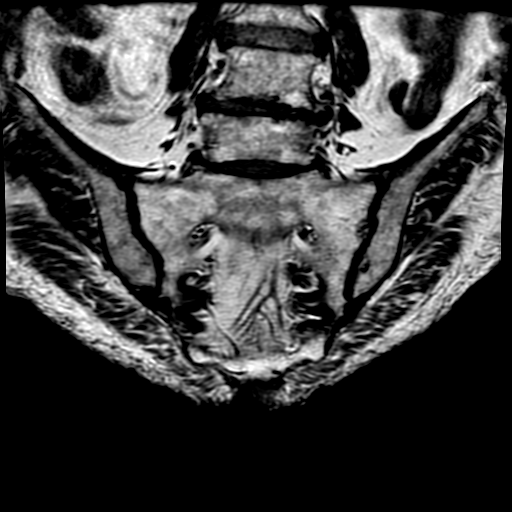
[im 10/17]
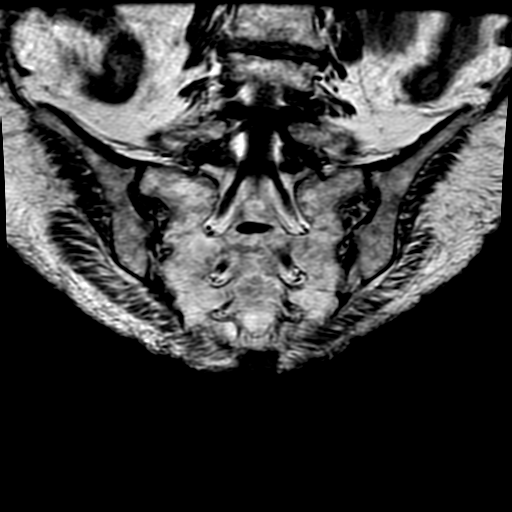
[im 13/17]
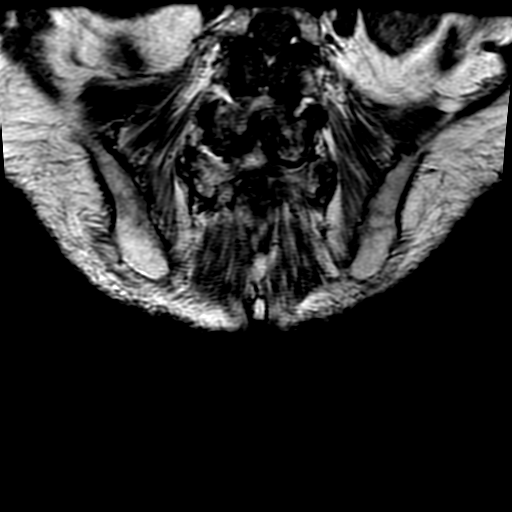
[im 17/17]
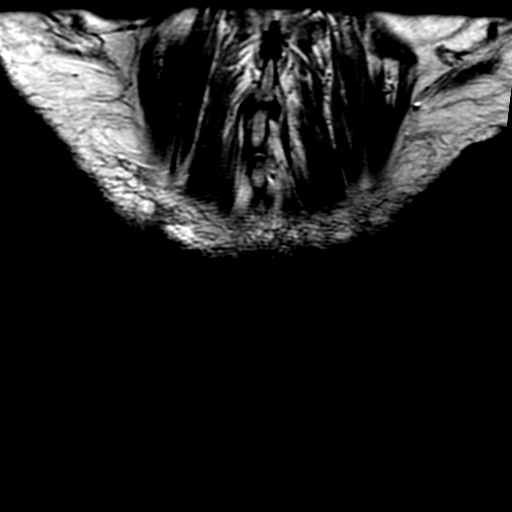

[Series 4: STIR · coronal · 4.0mm · 0.47mm/px · 4 of 17 slices shown]
[im 1/17]
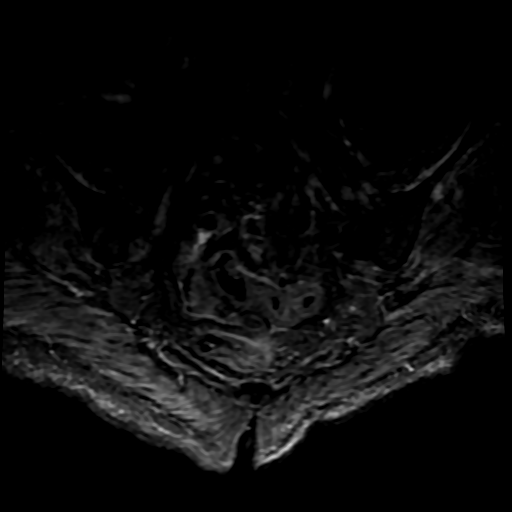
[im 4/17]
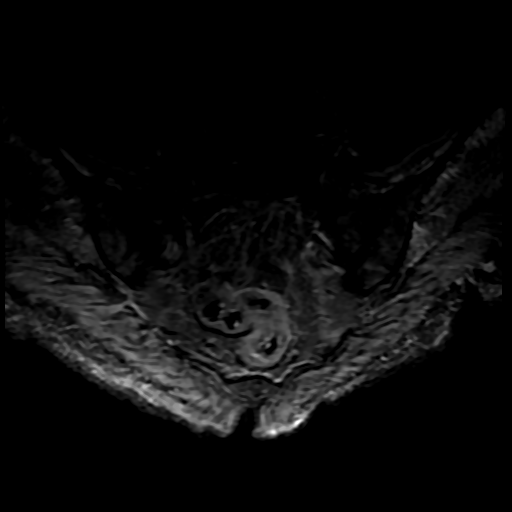
[im 10/17]
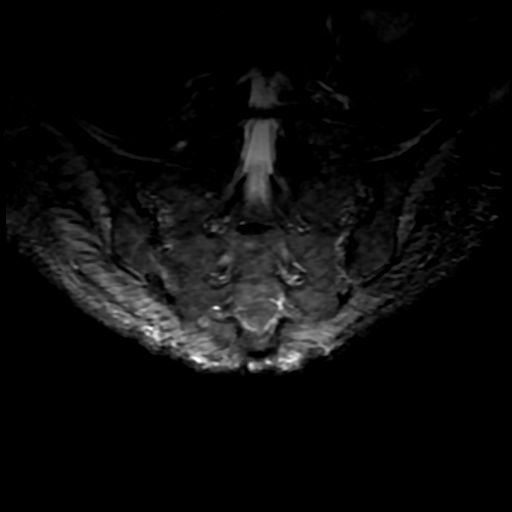
[im 17/17]
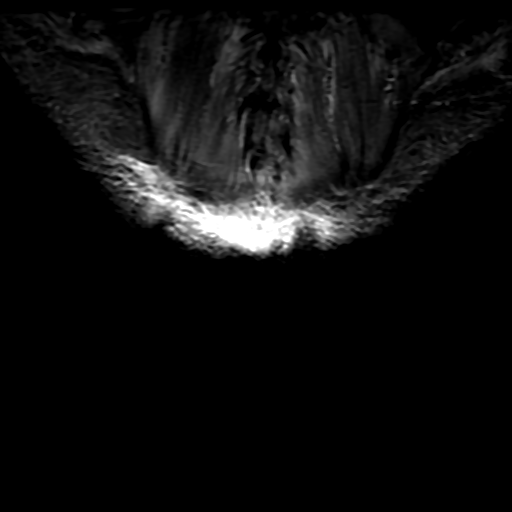

[Series 5: T1 · sagittal · 4.0mm · 0.47mm/px · 3 of 35 slices shown (2 of 2)]
[im 4/35]
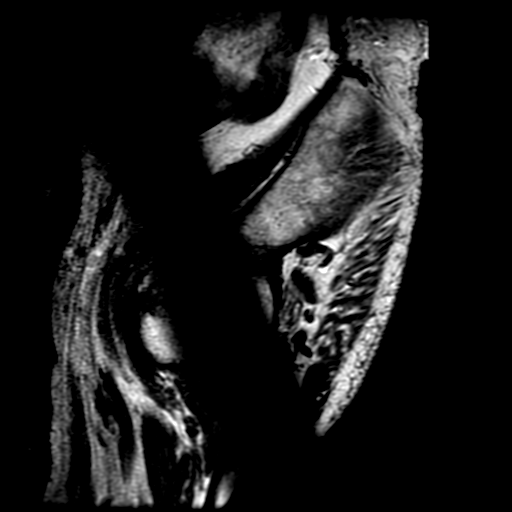
[im 18/35]
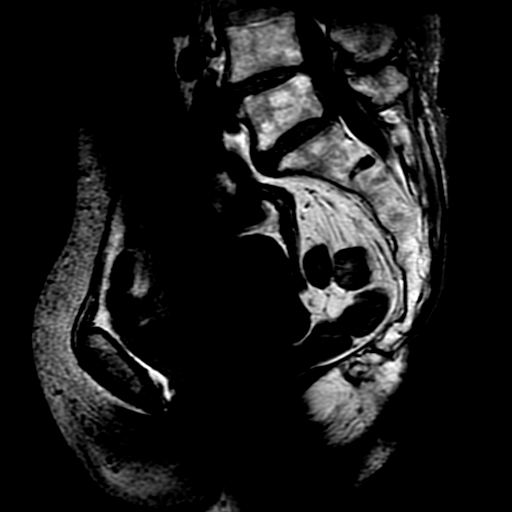
[im 31/35]
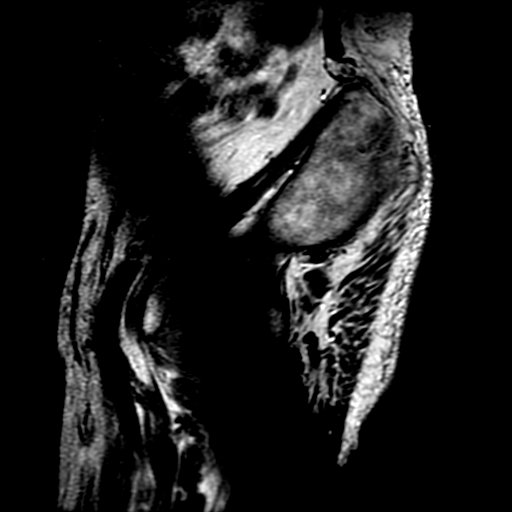

[Series 6: T2 fat-sat · axial · 4.0mm · 0.47mm/px · z∈[-175,-96]mm · 3 of 26 slices shown]
[im 4/26]
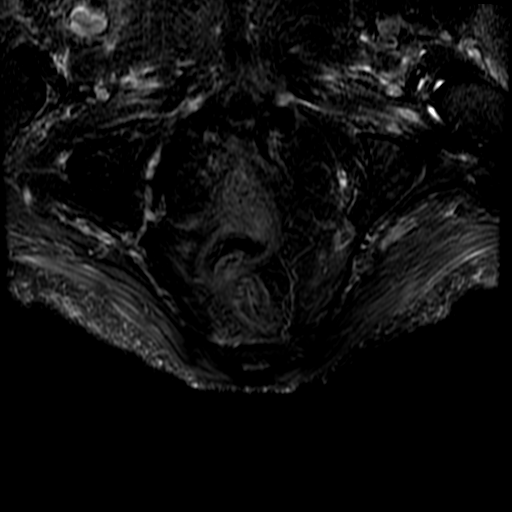
[im 15/26]
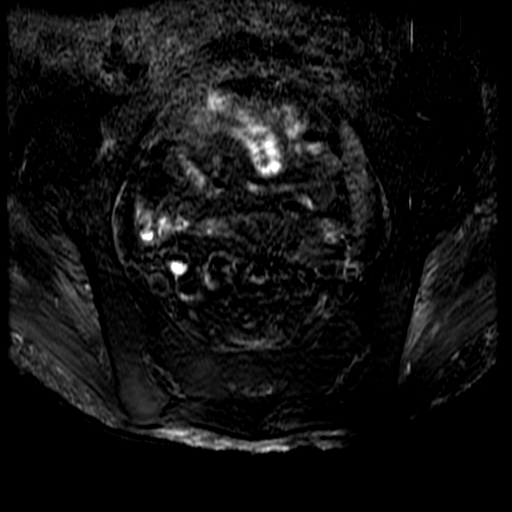
[im 22/26]
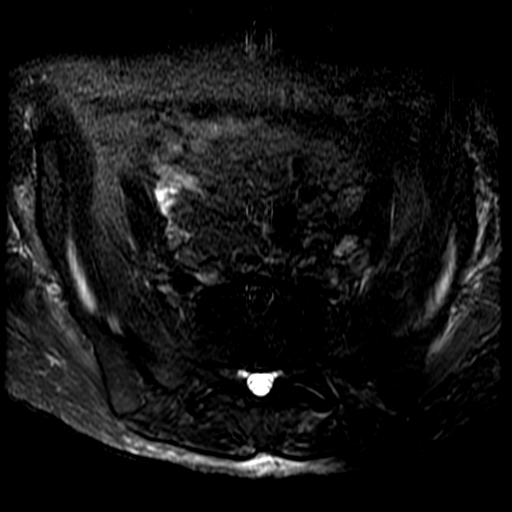

[16 of 48 positions shown; findings below may reference images not displayed]

FINDINGS: MRI LUMBAR SPINE FINDINGS:

Segmentation: Normal segmentation. Lowest well-formed disc labeled
the L5-S1 level.

Alignment: Straightening of the normal lumbar lordosis. Trace
anterolisthesis of L4 on L5.

Vertebrae: Chronic compression deformity involving the T12 vertebral
body with approximate 50% height loss is unchanged. Sequelae of
prior vertebral augmentation present at the L1 level. There has been
further interval collapse about the L1 compression fracture, now
measuring up to 80%. Mild marrow edema about the vertebroplasty
cement. The Bony retropulsion has worsened now measuring up to 7 mm.
New moderate spinal stenosis.

Vertebral body heights otherwise maintained. No other acute or
interval fracture. Bone marrow signal intensity heterogeneous but
within normal limits. No discrete or worrisome osseous lesions.

Conus medullaris: Extends to the L1 level and appears normal.

Paraspinal and other soft tissues: Paraspinous soft tissues
demonstrate no acute abnormality. Prominent 5.9 cm exophytic cyst
noted extending from the right kidney. Visualized visceral
structures within normal limits. Aortic atherosclerosis.

Disc levels:

T10-11: Seen only on sagittal projection. Facet hypertrophy. No
significant stenosis.

T11-12: Diffuse disc bulge with disc desiccation. Moderate facet and
ligamentum flavum hypertrophy. Mild spinal stenosis. Moderate right
with mild left foraminal narrowing.

T12-L1: Diffuse disc bulge with disc desiccation. Increase 7 mm bony
retropulsion of the L1 compression fracture, indenting the ventral
thecal sac (series 5, image 13). No significant deformity of the
adjacent conus medullaris. Moderate facet and ligamentum flavum
hypertrophy. Moderate canal and bilateral foraminal stenosis.

L1-2: Diffuse disc bulge. Moderate facet arthrosis. Mild canal
stenosis. Mild to moderate bilateral foraminal narrowing, right
worse than left.

L2-3: Diffuse disc bulge with disc desiccation. Moderate facet and
ligamentum flavum hypertrophy. Resultant moderate canal and
bilateral subarticular stenosis. Foramina are patent.

L3-4: Diffuse disc bulge with disc desiccation and intervertebral
disc space narrowing. Advanced bilateral facet arthrosis with
ligamentum flavum hypertrophy. Severe canal and bilateral
subarticular stenosis. Thecal sac measures 5 mm in AP diameter. Mild
bilateral L3 foraminal narrowing.

L4-5: Trace anterolisthesis. Diffuse circumferential disc bulge with
disc desiccation and intervertebral disc space narrowing. Associated
chronic reactive endplate changes with marginal endplate
osteophytosis. Moderate facet arthrosis with ligamentum flavum
hypertrophy. Possible remote laminectomy defect on the left. Mild to
moderate canal with moderate right subarticular stenosis. Severe
left with moderate right L4 foraminal stenosis.

L5-S1: Diffuse degenerative disc bulge with disc desiccation and
intervertebral disc space narrowing. Broad posterior disc bulge
closely approximates the descending S1 nerve roots without neural
impingement. Advanced bilateral facet arthrosis. No canal or lateral
recess stenosis. Moderate bilateral L5 foraminal stenosis.

MRI SACRUM FINDINGS:

Sacrum intact without evidence for acute or chronic fracture. SI
joints approximated and symmetric without significant degenerative
or erosive changes. Visualized bony pelvis intact and within normal
limits. Bone marrow signal intensity mildly heterogeneous but within
normal limits. No discrete or worrisome osseous lesions. No abnormal
marrow edema. Visualized sacral nerve roots symmetric and normal in
appearance bilaterally.

Chronic fatty atrophy noted within the gluteal musculature
bilaterally. No acute soft tissue abnormality.

Visualized bowels within normal limits. Bladder normal. Visualized
pelvic visceral within normal limits for age. No abnormal free fluid
within the pelvis.

No acute abnormality seen about the partially visualized hips.
IMPRESSION: MRI LUMBAR SPINE IMPRESSION:

1. Further interval collapse of the L1 compression fracture, now
measuring up to 80%, with increased 7 mm bony retropulsion. New
moderate canal stenosis at this level.
2. Advanced multilevel degenerative spondylolysis with resultant
diffuse canal and subarticular stenosis as above, severe at the L3-4
level.
3. Multifactorial degenerative changes at L4-5 with resultant severe
left foraminal stenosis, potentially impinging upon the exiting left
L4 nerve root. Additional mild to moderate multilevel foraminal
narrowing as above.

MRI SACRUM SPINE IMPRESSION:

Negative MRI of the sacrum.  No acute abnormality identified.
